# Patient Record
Sex: Female | Born: 1965
Health system: Southern US, Community
[De-identification: ages and names within clinical notes are randomized; demographics above are authoritative.]

## PROBLEM LIST (undated history)

## (undated) DIAGNOSIS — E669 Obesity, unspecified: Secondary | ICD-10-CM

## (undated) DIAGNOSIS — T7840XA Allergy, unspecified, initial encounter: Secondary | ICD-10-CM

## (undated) DIAGNOSIS — E049 Nontoxic goiter, unspecified: Secondary | ICD-10-CM

## (undated) DIAGNOSIS — K219 Gastro-esophageal reflux disease without esophagitis: Secondary | ICD-10-CM

## (undated) DIAGNOSIS — E063 Autoimmune thyroiditis: Secondary | ICD-10-CM

## (undated) DIAGNOSIS — D259 Leiomyoma of uterus, unspecified: Secondary | ICD-10-CM

## (undated) DIAGNOSIS — D649 Anemia, unspecified: Secondary | ICD-10-CM

## (undated) HISTORY — DX: Gastro-esophageal reflux disease without esophagitis: K21.9

## (undated) HISTORY — DX: Allergy, unspecified, initial encounter: T78.40XA

## (undated) HISTORY — DX: Anemia, unspecified: D64.9

## (undated) HISTORY — DX: Obesity, unspecified: E66.9

## (undated) HISTORY — DX: Nontoxic goiter, unspecified: E04.9

## (undated) HISTORY — DX: Autoimmune thyroiditis: E06.3

## (undated) HISTORY — PX: CHOLECYSTECTOMY: SHX55

---

## 1997-08-07 ENCOUNTER — Other Ambulatory Visit: Admission: RE | Admit: 1997-08-07 | Discharge: 1997-08-07 | Payer: Self-pay | Admitting: Family Medicine

## 1997-09-24 ENCOUNTER — Other Ambulatory Visit: Admission: RE | Admit: 1997-09-24 | Discharge: 1997-09-24 | Payer: Self-pay | Admitting: Family Medicine

## 1998-06-13 ENCOUNTER — Encounter: Admission: RE | Admit: 1998-06-13 | Discharge: 1998-06-13 | Payer: Self-pay | Admitting: Obstetrics

## 1998-07-09 ENCOUNTER — Emergency Department (HOSPITAL_COMMUNITY): Admission: EM | Admit: 1998-07-09 | Discharge: 1998-07-10 | Payer: Self-pay | Admitting: Emergency Medicine

## 1998-07-09 ENCOUNTER — Encounter: Payer: Self-pay | Admitting: Emergency Medicine

## 1998-11-10 ENCOUNTER — Emergency Department (HOSPITAL_COMMUNITY): Admission: EM | Admit: 1998-11-10 | Discharge: 1998-11-10 | Payer: Self-pay | Admitting: *Deleted

## 1999-09-11 ENCOUNTER — Encounter (INDEPENDENT_AMBULATORY_CARE_PROVIDER_SITE_OTHER): Payer: Self-pay | Admitting: Specialist

## 1999-09-11 ENCOUNTER — Observation Stay (HOSPITAL_COMMUNITY): Admission: RE | Admit: 1999-09-11 | Discharge: 1999-09-12 | Payer: Self-pay | Admitting: General Surgery

## 2000-03-22 ENCOUNTER — Other Ambulatory Visit: Admission: RE | Admit: 2000-03-22 | Discharge: 2000-03-22 | Payer: Self-pay | Admitting: Obstetrics and Gynecology

## 2000-03-22 ENCOUNTER — Encounter (INDEPENDENT_AMBULATORY_CARE_PROVIDER_SITE_OTHER): Payer: Self-pay

## 2000-03-31 ENCOUNTER — Encounter (INDEPENDENT_AMBULATORY_CARE_PROVIDER_SITE_OTHER): Payer: Self-pay

## 2000-03-31 ENCOUNTER — Other Ambulatory Visit: Admission: RE | Admit: 2000-03-31 | Discharge: 2000-03-31 | Payer: Self-pay | Admitting: Obstetrics and Gynecology

## 2001-04-04 ENCOUNTER — Encounter: Payer: Self-pay | Admitting: Emergency Medicine

## 2001-04-04 ENCOUNTER — Emergency Department (HOSPITAL_COMMUNITY): Admission: EM | Admit: 2001-04-04 | Discharge: 2001-04-04 | Payer: Self-pay | Admitting: Emergency Medicine

## 2004-03-26 ENCOUNTER — Other Ambulatory Visit: Admission: RE | Admit: 2004-03-26 | Discharge: 2004-03-26 | Payer: Self-pay | Admitting: Family Medicine

## 2005-04-22 ENCOUNTER — Other Ambulatory Visit: Admission: RE | Admit: 2005-04-22 | Discharge: 2005-04-22 | Payer: Self-pay | Admitting: Family Medicine

## 2006-04-15 ENCOUNTER — Ambulatory Visit: Payer: Self-pay | Admitting: Family Medicine

## 2006-04-21 ENCOUNTER — Ambulatory Visit: Payer: Self-pay | Admitting: Family Medicine

## 2006-04-30 ENCOUNTER — Emergency Department (HOSPITAL_COMMUNITY): Admission: EM | Admit: 2006-04-30 | Discharge: 2006-04-30 | Payer: Self-pay | Admitting: Family Medicine

## 2006-05-12 ENCOUNTER — Other Ambulatory Visit: Admission: RE | Admit: 2006-05-12 | Discharge: 2006-05-12 | Payer: Self-pay | Admitting: Family Medicine

## 2006-05-12 ENCOUNTER — Ambulatory Visit: Payer: Self-pay | Admitting: Family Medicine

## 2006-07-27 ENCOUNTER — Ambulatory Visit: Payer: Self-pay | Admitting: Family Medicine

## 2006-08-02 ENCOUNTER — Encounter: Admission: RE | Admit: 2006-08-02 | Discharge: 2006-08-02 | Payer: Self-pay | Admitting: Family Medicine

## 2006-08-12 ENCOUNTER — Encounter (INDEPENDENT_AMBULATORY_CARE_PROVIDER_SITE_OTHER): Payer: Self-pay | Admitting: Interventional Radiology

## 2006-08-12 ENCOUNTER — Encounter: Admission: RE | Admit: 2006-08-12 | Discharge: 2006-08-12 | Payer: Self-pay | Admitting: Family Medicine

## 2006-08-12 ENCOUNTER — Other Ambulatory Visit: Admission: RE | Admit: 2006-08-12 | Discharge: 2006-08-12 | Payer: Self-pay | Admitting: Interventional Radiology

## 2007-02-22 ENCOUNTER — Encounter: Admission: RE | Admit: 2007-02-22 | Discharge: 2007-02-22 | Payer: Self-pay | Admitting: Endocrinology

## 2007-03-30 ENCOUNTER — Emergency Department (HOSPITAL_COMMUNITY): Admission: EM | Admit: 2007-03-30 | Discharge: 2007-03-30 | Payer: Self-pay | Admitting: Emergency Medicine

## 2007-05-30 ENCOUNTER — Ambulatory Visit: Payer: Self-pay | Admitting: Family Medicine

## 2007-05-30 ENCOUNTER — Other Ambulatory Visit: Admission: RE | Admit: 2007-05-30 | Discharge: 2007-05-30 | Payer: Self-pay | Admitting: Family Medicine

## 2007-09-02 ENCOUNTER — Ambulatory Visit: Payer: Self-pay | Admitting: Family Medicine

## 2008-02-25 ENCOUNTER — Emergency Department (HOSPITAL_COMMUNITY): Admission: EM | Admit: 2008-02-25 | Discharge: 2008-02-25 | Payer: Self-pay | Admitting: Emergency Medicine

## 2008-06-04 ENCOUNTER — Ambulatory Visit: Payer: Self-pay | Admitting: Family Medicine

## 2008-06-04 ENCOUNTER — Other Ambulatory Visit: Admission: RE | Admit: 2008-06-04 | Discharge: 2008-06-04 | Payer: Self-pay | Admitting: Family Medicine

## 2008-06-04 LAB — HM PAP SMEAR: HM Pap smear: NEGATIVE

## 2009-02-11 ENCOUNTER — Ambulatory Visit: Payer: Self-pay | Admitting: Family Medicine

## 2009-06-05 ENCOUNTER — Ambulatory Visit: Payer: Self-pay | Admitting: Family Medicine

## 2009-11-14 LAB — HM MAMMOGRAPHY: HM Mammogram: ABNORMAL

## 2010-06-10 ENCOUNTER — Emergency Department (HOSPITAL_COMMUNITY): Payer: Self-pay

## 2010-06-10 ENCOUNTER — Emergency Department (HOSPITAL_COMMUNITY)
Admission: EM | Admit: 2010-06-10 | Discharge: 2010-06-10 | Disposition: A | Discharge status: Home / Self Care | Payer: Self-pay | Attending: Emergency Medicine | Admitting: Emergency Medicine

## 2010-06-10 DIAGNOSIS — R51 Headache: Secondary | ICD-10-CM | POA: Insufficient documentation

## 2010-06-10 DIAGNOSIS — W010XXA Fall on same level from slipping, tripping and stumbling without subsequent striking against object, initial encounter: Secondary | ICD-10-CM | POA: Insufficient documentation

## 2010-06-10 DIAGNOSIS — Y92009 Unspecified place in unspecified non-institutional (private) residence as the place of occurrence of the external cause: Secondary | ICD-10-CM | POA: Insufficient documentation

## 2010-06-10 DIAGNOSIS — S060X9A Concussion with loss of consciousness of unspecified duration, initial encounter: Secondary | ICD-10-CM | POA: Insufficient documentation

## 2010-06-10 DIAGNOSIS — E039 Hypothyroidism, unspecified: Secondary | ICD-10-CM | POA: Insufficient documentation

## 2010-06-18 ENCOUNTER — Emergency Department (HOSPITAL_COMMUNITY)
Admission: EM | Admit: 2010-06-18 | Discharge: 2010-06-18 | Disposition: A | Discharge status: Home / Self Care | Payer: Self-pay | Attending: Emergency Medicine | Admitting: Emergency Medicine

## 2010-06-18 DIAGNOSIS — H53149 Visual discomfort, unspecified: Secondary | ICD-10-CM | POA: Insufficient documentation

## 2010-06-18 DIAGNOSIS — F0781 Postconcussional syndrome: Secondary | ICD-10-CM | POA: Insufficient documentation

## 2010-06-18 DIAGNOSIS — R51 Headache: Secondary | ICD-10-CM | POA: Insufficient documentation

## 2010-06-18 DIAGNOSIS — E039 Hypothyroidism, unspecified: Secondary | ICD-10-CM | POA: Insufficient documentation

## 2010-07-21 ENCOUNTER — Encounter (INDEPENDENT_AMBULATORY_CARE_PROVIDER_SITE_OTHER): Payer: Self-pay | Admitting: Family Medicine

## 2010-07-21 DIAGNOSIS — E039 Hypothyroidism, unspecified: Secondary | ICD-10-CM

## 2010-07-21 DIAGNOSIS — D649 Anemia, unspecified: Secondary | ICD-10-CM

## 2010-07-22 ENCOUNTER — Telehealth: Payer: Self-pay | Admitting: Family Medicine

## 2010-07-22 MED ORDER — LEVOTHYROXINE SODIUM 100 MCG PO TABS: 100.0000 ug | ORAL_TABLET | Freq: Every day | ORAL | Status: DC

## 2010-07-22 NOTE — Telephone Encounter (Signed)
I called patient to advise her of her normal TSH.  She needs refills on her generic Synthroid.  Also advised her of her FS glu 82 and Hgb 11.3 indicating mild anemia.  She was advised to take her iron daily, and to try taking it along with Colace to help with the constipation.  If constipation still develops, to take iron daily during cycle, and qod rest of the month.  She just started her menstrual cycle today.  The medication prescribed by her GYN to be taken during her cycles is Lysteda.  Refilled med x 1 year

## 2010-08-01 NOTE — Op Note (Signed)
[facility]  Patient:    Denise Schroeder, Denise Schroeder                       MRN: [id number] Proc. Date: 09/11/99 Adm. Date:  [id number] Attending:  Henrene Dodge                           Operative Report  PREOPERATIVE DIAGNOSIS:  Chronic cholecystitis.  POSTOPERATIVE DIAGNOSIS:  Chronic cholecystitis.  OPERATION:  Laparoscopic cholecystectomy.  ANESTHESIA:  General.  SURGEON:  Anselm Pancoast. Zachery Dakins, M.D.  ASSISTANT:  Abigail Miyamoto, M.D.  INDICATIONS:  Ms. Courtnei Ruddell is a 45 year old female who was referred to me at the courtesy of Dr. ____ with symptomatic recurrent gallstones.  She has had several episodes of kind of a recurrence of pain more recently, being out of work for about 10 days, and I saw her in the office on Tuesday.  She was not acutely ill but was having enough episodes of pain that I recommended we proceed on with a laparoscopic cholecystectomy.  Her liver function tests were unremarkable.  DESCRIPTION OF PROCEDURE:  Preoperatively she was given 3 g of Unasyn and PAS stockings, and was taken back to the operative suite.  The induction of general  anesthesia, an oral tube into the stomach, after the endotracheal tube had been  placed.  The abdomen was prepped with Betadine and SurgiScrub solution, and draped in a sterile manner.  An incision at the umbilicus was made down through the fatty tissue.  The patient was fairly heavy and the fascia was identified and pickup p between two Kochers.  A small opening made.  The underlying peritoneum identified, and a Kelly carefully placed through the peritoneum.  A traction suture was placed and then the Hasson cannula introduced.  The gallbladder was fairly tense and quite large, but not acutely inflamed.  The upper 10 mm trocar was placed under direct vision after anesthetizing the fascia, and the two lateral 5 mm trocars were placed in the appropriate position by Dr.  Magnus Ivan.  The gallbladder was grasped and retracted upward and outward. There were adhesions around it.  These were kind f peeled down.  Good hemostasis.  The large stone impacted in the neck of the gallbladder was easily identified.  The gallbladder was retracted upward and then the more proximal portion retracted outward, so that we could free up the proximal portion of the gallbladder, identifying the cystic duct where it junctioned to he gallbladder, and also the cystic artery.  The cystic artery was doubly clamped proximally, singly, distally, and then divided.  Then the cystic duct was freed up circumferentially.  A clip would not go across it fairly easily, even though the cystic duct was a little large, and three clips were placed proximally, singly, and distally, and the cystic duct divided.  No x-ray obtained.  The posterior artery was also identified and this was doubly clipped proximally, singly, distally, and divided.  Then the gallbladder was freed from its bed using the electrocautery first with the hook, and then switched to the spatula.  The gallbladder had a lot of kind of chronic inflammation.  This has obviously been a problem off and on,  more than just a few weeks that the patient has been significantly symptomatic.  Good hemostasis was obtained, and I switched the cautery to normal to coagulate the most distal portion, as the most distal portion of the gallbladder  was nearly truly intrahepatic and took right much dissection to kind of free it from the gallbladder.  Good hemostasis was obtained.  The camera was then placed in the upper 10 mm trocar.  The gallbladder neck grasped and brought up through the fascia, and then kind of manipulated so that the stone could come up through the fascia which was left open and slightly larger.  Two figure-of-eight of #0 Vicryl were placed to close the fascia.  Reinspection of the area of the peritoneum omentum on  testing up to the fascia closure, and then this was anesthetized with Marcaine.  The remaining irrigating fluid was aspirated.  Good hemostasis.  The two lateral 5 mm trocars were removed under direct vision.  No evidence of any bleeding up in the gallbladder fossa.  The carbon dioxide was released.  The upper 10 mm  trocar was withdrawn carefully.  The patient tolerated the procedure nicely and the subcutaneous wounds were closed with #4-0 Vicryl, and benzoin and Steri-Strips on the skin.  The patient was sent to the recovery room in a stable postoperative condition, hoping to be ready for discharge in the a.m. DD:  09/11/99 TD:  09/11/99 Job: [id number]

## 2010-09-29 ENCOUNTER — Emergency Department (HOSPITAL_COMMUNITY): Payer: No Typology Code available for payment source

## 2010-09-29 ENCOUNTER — Emergency Department (HOSPITAL_COMMUNITY)
Admission: EM | Admit: 2010-09-29 | Discharge: 2010-09-29 | Disposition: A | Discharge status: Home / Self Care | Payer: No Typology Code available for payment source | Attending: Emergency Medicine | Admitting: Emergency Medicine

## 2010-09-29 DIAGNOSIS — M542 Cervicalgia: Secondary | ICD-10-CM | POA: Insufficient documentation

## 2010-09-29 DIAGNOSIS — M546 Pain in thoracic spine: Secondary | ICD-10-CM | POA: Insufficient documentation

## 2010-09-29 DIAGNOSIS — E039 Hypothyroidism, unspecified: Secondary | ICD-10-CM | POA: Insufficient documentation

## 2010-10-01 ENCOUNTER — Ambulatory Visit (INDEPENDENT_AMBULATORY_CARE_PROVIDER_SITE_OTHER): Payer: Self-pay | Admitting: Medical

## 2010-10-01 ENCOUNTER — Encounter: Payer: Self-pay | Admitting: Medical

## 2010-10-01 DIAGNOSIS — S239XXA Sprain of unspecified parts of thorax, initial encounter: Secondary | ICD-10-CM

## 2010-10-01 DIAGNOSIS — S139XXA Sprain of joints and ligaments of unspecified parts of neck, initial encounter: Secondary | ICD-10-CM

## 2010-10-01 DIAGNOSIS — M791 Myalgia, unspecified site: Secondary | ICD-10-CM

## 2010-10-01 DIAGNOSIS — IMO0002 Reserved for concepts with insufficient information to code with codable children: Secondary | ICD-10-CM

## 2010-10-01 DIAGNOSIS — S161XXA Strain of muscle, fascia and tendon at neck level, initial encounter: Secondary | ICD-10-CM

## 2010-10-01 DIAGNOSIS — IMO0001 Reserved for inherently not codable concepts without codable children: Secondary | ICD-10-CM

## 2010-10-01 NOTE — Patient Instructions (Signed)
Rest, use gentle stretching and range of motion activity for neck, shoulders, arms, and back.  Heat pad as desired.  You can use Flexeril 2-3 times daily for spasm.  OTC Ibuprofen 200 mg, take 3-4 tablets up to 3 times daily.  You will be sore for at least a week.    If not improving by the end of the week or early next week, call and we can refer to physical therapy.
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
## 2010-10-06 ENCOUNTER — Encounter: Payer: Self-pay | Admitting: Family Medicine

## 2010-10-07 ENCOUNTER — Encounter: Payer: Self-pay | Admitting: Medical

## 2010-10-07 ENCOUNTER — Ambulatory Visit (INDEPENDENT_AMBULATORY_CARE_PROVIDER_SITE_OTHER): Payer: Self-pay | Admitting: Medical

## 2010-10-07 VITALS — BP 110/80 | HR 64 | Temp 98.4°F | Ht 68.0 in | Wt 268.0 lb

## 2010-10-07 DIAGNOSIS — S139XXA Sprain of joints and ligaments of unspecified parts of neck, initial encounter: Secondary | ICD-10-CM

## 2010-10-07 DIAGNOSIS — S161XXA Strain of muscle, fascia and tendon at neck level, initial encounter: Secondary | ICD-10-CM

## 2010-10-07 DIAGNOSIS — M542 Cervicalgia: Secondary | ICD-10-CM

## 2010-10-07 DIAGNOSIS — R51 Headache: Secondary | ICD-10-CM
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
## 2010-10-10 ENCOUNTER — Telehealth: Payer: Self-pay | Admitting: *Deleted

## 2010-10-10 ENCOUNTER — Other Ambulatory Visit: Payer: Self-pay | Admitting: *Deleted

## 2010-10-10 MED ORDER — TIZANIDINE HCL 4 MG PO TABS: 4.0000 mg | ORAL_TABLET | Freq: Every day | ORAL | Status: AC

## 2010-10-10 NOTE — Telephone Encounter (Addendum)
Message copied by Dorthula Perfect on Fri Oct 10, 2010  9:54 AM ------      Message from: Aleen Campi, DAVID S      Created: Thu Oct 09, 2010  4:30 PM       Pt called and said muscle relaxer is too strong.   She can just use antiinflammatories such as Ibuprofen if needed.  Regarding muscle relaxer, she can either try 1/2 tablet QHS, or we can send Tizanidine and see if this is less sedating.              Has she begun PT?   Pt was called and will try 1/2 tablet of Flexeril QHS tonight.  If still complaining of sleepiness then pt will call back Friday 10-10-10 to let nurse know.  Nurse will then call in Tizanidine 4 mg 1 QHS #20 with no refill to pharmacy.  Pt will start PT on 10-13-10 at 9:15 am at [facility].  CM LPN

## 2010-10-13 ENCOUNTER — Ambulatory Visit
Payer: No Typology Code available for payment source | Attending: Family Medicine | Admitting: Rehabilitative and Restorative Service Providers"

## 2010-10-13 DIAGNOSIS — IMO0001 Reserved for inherently not codable concepts without codable children: Secondary | ICD-10-CM | POA: Insufficient documentation

## 2010-10-13 DIAGNOSIS — M2569 Stiffness of other specified joint, not elsewhere classified: Secondary | ICD-10-CM | POA: Insufficient documentation

## 2010-10-13 DIAGNOSIS — M542 Cervicalgia: Secondary | ICD-10-CM | POA: Insufficient documentation

## 2010-10-14 ENCOUNTER — Encounter: Payer: Self-pay | Admitting: Rehabilitative and Restorative Service Providers"

## 2010-10-16 ENCOUNTER — Ambulatory Visit
Payer: No Typology Code available for payment source | Attending: Family Medicine | Admitting: Rehabilitative and Restorative Service Providers"

## 2010-10-16 DIAGNOSIS — M542 Cervicalgia: Secondary | ICD-10-CM | POA: Insufficient documentation

## 2010-10-16 DIAGNOSIS — IMO0001 Reserved for inherently not codable concepts without codable children: Secondary | ICD-10-CM | POA: Insufficient documentation

## 2010-10-16 DIAGNOSIS — M2569 Stiffness of other specified joint, not elsewhere classified: Secondary | ICD-10-CM | POA: Insufficient documentation

## 2010-10-20 ENCOUNTER — Ambulatory Visit: Payer: No Typology Code available for payment source | Admitting: Rehabilitative and Restorative Service Providers"

## 2010-10-23 ENCOUNTER — Ambulatory Visit: Payer: No Typology Code available for payment source | Admitting: Rehabilitative and Restorative Service Providers"

## 2010-10-27 ENCOUNTER — Ambulatory Visit: Payer: Self-pay | Admitting: Medical

## 2010-10-27 ENCOUNTER — Encounter: Payer: Self-pay | Admitting: Rehabilitative and Restorative Service Providers"

## 2010-10-30 ENCOUNTER — Ambulatory Visit: Payer: No Typology Code available for payment source | Admitting: Rehabilitative and Restorative Service Providers"

## 2010-11-03 ENCOUNTER — Ambulatory Visit: Payer: No Typology Code available for payment source | Admitting: Rehabilitative and Restorative Service Providers"

## 2010-11-05 ENCOUNTER — Ambulatory Visit: Payer: No Typology Code available for payment source | Admitting: Rehabilitative and Restorative Service Providers"

## 2010-11-10 ENCOUNTER — Ambulatory Visit: Payer: No Typology Code available for payment source | Admitting: Rehabilitative and Restorative Service Providers"

## 2010-11-12 ENCOUNTER — Ambulatory Visit: Payer: No Typology Code available for payment source | Admitting: Rehabilitative and Restorative Service Providers"

## 2010-11-14 ENCOUNTER — Other Ambulatory Visit: Payer: Self-pay | Admitting: Obstetrics and Gynecology

## 2010-11-24 ENCOUNTER — Ambulatory Visit
Payer: No Typology Code available for payment source | Attending: Family Medicine | Admitting: Rehabilitative and Restorative Service Providers"

## 2010-11-24 DIAGNOSIS — IMO0001 Reserved for inherently not codable concepts without codable children: Secondary | ICD-10-CM | POA: Insufficient documentation

## 2010-11-24 DIAGNOSIS — M2569 Stiffness of other specified joint, not elsewhere classified: Secondary | ICD-10-CM | POA: Insufficient documentation

## 2010-11-24 DIAGNOSIS — M542 Cervicalgia: Secondary | ICD-10-CM | POA: Insufficient documentation

## 2010-12-02 ENCOUNTER — Ambulatory Visit (INDEPENDENT_AMBULATORY_CARE_PROVIDER_SITE_OTHER): Payer: Self-pay | Admitting: Medical

## 2010-12-02 ENCOUNTER — Encounter: Payer: Self-pay | Admitting: Medical

## 2010-12-02 VITALS — BP 128/88 | HR 60 | Temp 98.0°F | Resp 16 | Ht 69.0 in | Wt 273.0 lb

## 2010-12-02 DIAGNOSIS — M25519 Pain in unspecified shoulder: Secondary | ICD-10-CM

## 2010-12-02 DIAGNOSIS — M542 Cervicalgia: Secondary | ICD-10-CM

## 2010-12-02 DIAGNOSIS — M25512 Pain in left shoulder: Secondary | ICD-10-CM

## 2010-12-02 MED ORDER — CYCLOBENZAPRINE HCL 10 MG PO TABS: ORAL_TABLET | ORAL | Status: DC
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
## 2010-12-03 LAB — URINALYSIS, ROUTINE W REFLEX MICROSCOPIC
Nitrite: NEGATIVE
Specific Gravity, Urine: 1.023
Urobilinogen, UA: 1
pH: 6

## 2010-12-19 ENCOUNTER — Encounter (HOSPITAL_BASED_OUTPATIENT_CLINIC_OR_DEPARTMENT_OTHER): Payer: Self-pay

## 2010-12-19 ENCOUNTER — Emergency Department (HOSPITAL_BASED_OUTPATIENT_CLINIC_OR_DEPARTMENT_OTHER)
Admission: EM | Admit: 2010-12-19 | Discharge: 2010-12-19 | Disposition: A | Discharge status: Home / Self Care | Payer: Self-pay | Attending: Emergency Medicine | Admitting: Emergency Medicine

## 2010-12-19 DIAGNOSIS — E669 Obesity, unspecified: Secondary | ICD-10-CM | POA: Insufficient documentation

## 2010-12-19 DIAGNOSIS — K219 Gastro-esophageal reflux disease without esophagitis: Secondary | ICD-10-CM | POA: Insufficient documentation

## 2010-12-19 DIAGNOSIS — M549 Dorsalgia, unspecified: Secondary | ICD-10-CM | POA: Insufficient documentation

## 2010-12-19 LAB — URINALYSIS, ROUTINE W REFLEX MICROSCOPIC
Bilirubin Urine: NEGATIVE
Bilirubin Urine: NEGATIVE
Glucose, UA: NEGATIVE mg/dL
Hgb urine dipstick: NEGATIVE
Ketones, ur: NEGATIVE mg/dL
Leukocytes, UA: NEGATIVE
Nitrite: NEGATIVE
Nitrite: NEGATIVE
Protein, ur: NEGATIVE mg/dL
Protein, ur: NEGATIVE mg/dL
Specific Gravity, Urine: 1.016 (ref 1.005–1.030)
Specific Gravity, Urine: 1.028 (ref 1.005–1.030)
Urobilinogen, UA: 0.2 mg/dL (ref 0.0–1.0)
Urobilinogen, UA: 0.2 mg/dL (ref 0.0–1.0)
pH: 6 (ref 5.0–8.0)

## 2010-12-19 LAB — DIFFERENTIAL
Basophils Absolute: 0 10*3/uL (ref 0.0–0.1)
Basophils Relative: 0 % (ref 0–1)
Eosinophils Absolute: 0.3 10*3/uL (ref 0.0–0.7)
Lymphocytes Relative: 37 % (ref 12–46)
Monocytes Absolute: 0.6 10*3/uL (ref 0.1–1.0)
Neutro Abs: 3.3 10*3/uL (ref 1.7–7.7)

## 2010-12-19 LAB — CBC
Hemoglobin: 11.6 g/dL — ABNORMAL LOW (ref 12.0–15.0)
MCHC: 32.7 g/dL (ref 30.0–36.0)
MCV: 88.5 fL (ref 78.0–100.0)
RBC: 4 MIL/uL (ref 3.87–5.11)
RDW: 14.1 % (ref 11.5–15.5)

## 2010-12-19 LAB — POCT I-STAT, CHEM 8
Calcium, Ion: 1.2 mmol/L (ref 1.12–1.32)
Glucose, Bld: 84 mg/dL (ref 70–99)
HCT: 35 % — ABNORMAL LOW (ref 36.0–46.0)
Hemoglobin: 11.9 g/dL — ABNORMAL LOW (ref 12.0–15.0)
TCO2: 26 mmol/L (ref 0–100)

## 2010-12-19 LAB — URINE MICROSCOPIC-ADD ON

## 2010-12-19 MED ORDER — HYDROCODONE-ACETAMINOPHEN 5-325 MG PO TABS: 2.0000 | ORAL_TABLET | ORAL | Status: DC | PRN

## 2010-12-19 MED ORDER — PREDNISONE 10 MG PO TABS: ORAL_TABLET | ORAL | Status: DC

## 2010-12-19 MED ORDER — KETOROLAC TROMETHAMINE 60 MG/2ML IM SOLN
60.0000 mg | Freq: Once | INTRAMUSCULAR | Status: AC
  Administered 2010-12-19: 60 mg via INTRAMUSCULAR
  Filled 2010-12-19: qty 2

## 2010-12-19 MED ORDER — ONDANSETRON HCL 4 MG/2ML IJ SOLN
4.0000 mg | Freq: Once | INTRAMUSCULAR | Status: AC
  Administered 2010-12-19: 4 mg via INTRAMUSCULAR
  Filled 2010-12-19: qty 2

## 2010-12-19 MED ORDER — HYDROMORPHONE HCL 1 MG/ML IJ SOLN
2.0000 mg | Freq: Once | INTRAMUSCULAR | Status: AC
  Administered 2010-12-19: 2 mg via INTRAMUSCULAR
  Filled 2010-12-19 (×2): qty 1

## 2010-12-19 NOTE — ED Notes (Signed)
Lower back pain x 2 weeks-denies injury

## 2010-12-19 NOTE — ED Provider Notes (Signed)
History     CSN: [id number] Arrival date & time: 12/19/2010  9:08 PM  Chief Complaint  Patient presents with  . Back Pain    (Consider location/radiation/quality/duration/timing/severity/associated sxs/prior treatment) Patient is a 45 y.o. female presenting with back pain.  Back Pain  This is a new problem. The current episode started more than 1 week ago. The problem occurs constantly. The problem has been gradually worsening. The pain is associated with no known injury. The pain is present in the lumbar spine. The quality of the pain is described as stabbing and shooting. The pain is at a severity of 8/10. The pain is severe. The symptoms are aggravated by bending, twisting and certain positions. The pain is worse during the night. Stiffness is present all day. Pertinent negatives include no chest pain. She has tried nothing for the symptoms. The treatment provided no relief.    Past Medical History  Diagnosis Date  . GERD (gastroesophageal reflux disease)   . Anemia   . Allergy   . Hashimoto thyroiditis   . Goiter   . Obesity     Past Surgical History  Procedure Date  . Cholecystectomy     No family history on file.  History  Substance Use Topics  . Smoking status: Never Smoker   . Smokeless tobacco: Not on file  . Alcohol Use: No    OB History    Grav Para Term Preterm Abortions TAB SAB Ect Mult Living                  Review of Systems  Cardiovascular: Negative for chest pain.  Musculoskeletal: Positive for back pain.  All other systems reviewed and are negative.    Allergies  Citrus and Latex  Home Medications   Current Outpatient Rx  Name Route Sig Dispense Refill  . CYCLOBENZAPRINE HCL 10 MG PO TABS  1/2-1 tablet prn QHS or up to BID prn 30 tablet 0  . IBUPROFEN 800 MG PO TABS Oral Take 800 mg by mouth every 8 (eight) hours as needed. For pain     . LEVOTHYROXINE SODIUM 100 MCG PO TABS Oral Take 100 mcg by mouth daily.      Marland Kitchen ONE-DAILY MULTI  VITAMINS PO TABS Oral Take 1 tablet by mouth daily.      Marland Kitchen LEVOTHYROXINE SODIUM 100 MCG PO TABS Oral Take 1 tablet (100 mcg total) by mouth daily. 30 tablet 11    BP 109/73  Pulse 66  Temp(Src) 98.2 F (36.8 C) (Oral)  Resp 16  Ht 5\' 8"  (1.727 m)  Wt 272 lb (123.378 kg)  BMI 41.36 kg/m2  SpO2 97%  LMP 11/15/2010  Physical Exam  Nursing note and vitals reviewed. Constitutional: She is oriented to person, place, and time. She appears well-developed and well-nourished.  HENT:  Head: Normocephalic and atraumatic.  Eyes: Conjunctivae and EOM are normal. Pupils are equal, round, and reactive to light.  Neck: Normal range of motion. Neck supple.  Cardiovascular: Normal rate and regular rhythm.   Pulmonary/Chest: Effort normal and breath sounds normal.  Abdominal: Soft. Bowel sounds are normal.  Musculoskeletal: She exhibits tenderness.       Tender ls spine ,  Decreased range of motion,  Pain with moving  Neurological: She is alert and oriented to person, place, and time.  Skin: Skin is warm.  Psychiatric: She has a normal mood and affect.    ED Course  Procedures (including critical care time)   Labs Reviewed  URINALYSIS,  ROUTINE W REFLEX MICROSCOPIC   No results found.   No diagnosis found.    MDM  Urine negative,  Pt advised to follow up with Dr. Susann Givens next week.        Langston Masker, [place] 12/19/10 2207

## 2010-12-22 ENCOUNTER — Ambulatory Visit (INDEPENDENT_AMBULATORY_CARE_PROVIDER_SITE_OTHER): Payer: Self-pay | Admitting: Medical

## 2010-12-22 ENCOUNTER — Telehealth: Payer: Self-pay | Admitting: Medical

## 2010-12-22 ENCOUNTER — Encounter: Payer: Self-pay | Admitting: Medical

## 2010-12-22 VITALS — BP 120/82 | HR 60 | Temp 98.5°F | Ht 70.0 in | Wt 273.0 lb

## 2010-12-22 DIAGNOSIS — M549 Dorsalgia, unspecified: Secondary | ICD-10-CM | POA: Insufficient documentation

## 2010-12-22 DIAGNOSIS — M545 Low back pain, unspecified: Secondary | ICD-10-CM

## 2010-12-22 DIAGNOSIS — R10819 Abdominal tenderness, unspecified site: Secondary | ICD-10-CM

## 2010-12-22 DIAGNOSIS — N23 Unspecified renal colic: Secondary | ICD-10-CM

## 2010-12-22 LAB — POCT URINALYSIS DIPSTICK
Ketones, UA: NEGATIVE
Leukocytes, UA: NEGATIVE
Protein, UA: NEGATIVE
pH, UA: 5

## 2010-12-22 NOTE — Patient Instructions (Signed)
Hydrate well with water, use your pain medication that was prescribed by the Emergency Dept.  Urinate through the hat in the event you see stone material.  If you pain worsens, or if new symptoms occur, then call or return.  Otherwise, call back Wednesday to give me an update on your symptoms.
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
## 2010-12-22 NOTE — Telephone Encounter (Signed)
As we discussed, at this point I want her to use a course of the pain medication (Hydrocodone) given by the ED, rest, no heavy lifting, and strain urine to look for stones.  I want her to call back Wednesday or Thursday to let me know if pain is improving?  If worsening, we can consider CT of abdomen or other imaging pending pain level.  I advised against imaging at the moment.

## 2010-12-22 NOTE — Telephone Encounter (Signed)
Please call patient, she is trying to find least expensive facility for MRI, needs to know what you are going to order

## 2010-12-23 ENCOUNTER — Ambulatory Visit (INDEPENDENT_AMBULATORY_CARE_PROVIDER_SITE_OTHER): Payer: Self-pay | Admitting: Medical

## 2010-12-23 ENCOUNTER — Encounter: Payer: Self-pay | Admitting: Medical

## 2010-12-23 ENCOUNTER — Ambulatory Visit: Admission: RE | Admit: 2010-12-23 | Payer: Self-pay | Source: Ambulatory Visit

## 2010-12-23 ENCOUNTER — Other Ambulatory Visit: Payer: Self-pay | Admitting: Family Medicine

## 2010-12-23 ENCOUNTER — Telehealth: Payer: Self-pay | Admitting: Family Medicine

## 2010-12-23 VITALS — BP 110/80 | HR 68 | Temp 98.5°F | Resp 18

## 2010-12-23 DIAGNOSIS — R109 Unspecified abdominal pain: Secondary | ICD-10-CM

## 2010-12-23 DIAGNOSIS — M549 Dorsalgia, unspecified: Secondary | ICD-10-CM

## 2010-12-23 DIAGNOSIS — R3129 Other microscopic hematuria: Secondary | ICD-10-CM

## 2010-12-23 LAB — POCT URINALYSIS DIPSTICK
Bilirubin, UA: NEGATIVE
Glucose, UA: NEGATIVE
Leukocytes, UA: NEGATIVE
Nitrite, UA: NEGATIVE

## 2010-12-23 MED ORDER — OXYCODONE-ACETAMINOPHEN 7.5-325 MG PO TABS: 1.0000 | ORAL_TABLET | ORAL | Status: DC | PRN

## 2010-12-23 MED ORDER — CIPROFLOXACIN HCL 500 MG PO TABS: 500.0000 mg | ORAL_TABLET | Freq: Two times a day (BID) | ORAL | Status: AC

## 2010-12-23 NOTE — Telephone Encounter (Signed)
Patient is coming in at 230pm for an appointment. CLS

## 2010-12-23 NOTE — Patient Instructions (Signed)
Begin Cipro 1 tablet twice daily for urinary infection.  Begin Percocet every 6 hours as needed for pain.  As pain subsides, then just use Vicodin or OTC Aleve for pain.    Drink lots of water to flush the kidneys.  We will call you with culture results. Marland Kitchen

## 2010-12-23 NOTE — Telephone Encounter (Signed)
I called and discussed concerns with her.  She will return today for recheck
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
## 2010-12-24 LAB — URINE CULTURE
Colony Count: NO GROWTH
Organism ID, Bacteria: NO GROWTH

## 2010-12-25 LAB — URINE CULTURE

## 2010-12-26 NOTE — ED Provider Notes (Signed)
Medical screening examination/treatment/procedure(s) were performed by non-physician practitioner and as supervising physician I was immediately available for consultation/collaboration.   Raeford Razor, MD 12/26/10 1116

## 2010-12-29 ENCOUNTER — Telehealth: Payer: Self-pay | Admitting: Medical

## 2010-12-29 ENCOUNTER — Telehealth: Payer: Self-pay | Admitting: Family Medicine

## 2010-12-29 NOTE — Telephone Encounter (Signed)
I called and spoke to pt.  She finished Cipro which seemed to help some.  She still had a lot of back pain over the weekend, worsened some today, had to come home from work.  She took Percocet but this sedates her.  She has not taken the prednisone given by the ED.  I advised she try cutting Vicodin or Percocet in half and use this prn for pain.  Can also use OTC ibuprofen.  She will also begin the short course of Prednisone.  If not improving in 2-3 days, will call back.

## 2010-12-31 NOTE — Telephone Encounter (Signed)
shane handled

## 2011-01-06 ENCOUNTER — Telehealth: Payer: Self-pay | Admitting: Family Medicine

## 2011-01-06 NOTE — Telephone Encounter (Signed)
If overall doing better/much improved, then I would just use some OTC Aleve once to twice daily prn, try and start doing some general slow back stretches daily, and walk some for exercise until completely back to normal.  If she worsens again, or if things are not improving, a referral back to PT for back pain would be a good option.

## 2011-01-06 NOTE — Telephone Encounter (Signed)
PATIENT CALLED TO LET YOU KNOW THAT SHE FINISH HER LAST PREDISONE AND SHE SAID THAT SHE FELLS BETTER. SHE SAID THAT SHE STILL FEELS A LITTLE MUSCLE SORENESS. HER QUESTION IS DOES SHE NEED ANOTHER RX OR WHAT DOES SHE NEED TO DO NEXT. CLS

## 2011-01-06 NOTE — Telephone Encounter (Signed)
Patient was notified of Shane's advise. CLS

## 2011-01-14 ENCOUNTER — Telehealth: Payer: Self-pay | Admitting: Family Medicine

## 2011-01-14 NOTE — Telephone Encounter (Signed)
At this point, her evaluation to date is more suggestive of a musculoskeletal issue.  I know she just completed therapy for other issues, but since she is no better, I would recommend either referral back to physical therapy or consider either starting with back xray or MRI of the lumbar spine.  Verify she has not had any recent low back xray, CT or MRI.  (there is some note in chart about CT L spine, but I don't see any result)  Where is her current pain?  Does it radiate to either leg? Either numbness or tingling?

## 2011-01-14 NOTE — Telephone Encounter (Signed)
LM for pt. Top callback. CLS

## 2011-01-15 ENCOUNTER — Other Ambulatory Visit: Payer: Self-pay | Admitting: Medical

## 2011-01-15 DIAGNOSIS — M549 Dorsalgia, unspecified: Secondary | ICD-10-CM

## 2011-01-15 MED ORDER — TRAMADOL HCL 50 MG PO TABS: ORAL_TABLET | ORAL | Status: DC

## 2011-01-15 NOTE — Telephone Encounter (Signed)
Patient states that she thinks that she should have a MRI of her back verus the PT. She said that she is going to have to spend money so she would rather spend it on xrays of her back. She said that you could also send in her the RX for Ultram to the pharmacy. CLS

## 2011-01-15 NOTE — Telephone Encounter (Signed)
Would she be willing to go to at least a few sessions of PT to help the back pains?  If so refer and advise that she is self pay.  Start with the therapy office she went to recently.   Otherwise she can use muscle relaxer periodically.  Other ideas that would be low cost would be to look into water aerobics at the [facility] or Yoga classes to help with stretching.    I can write for Ultram pain medication to be used as needed basis for breakthrough pain.

## 2011-01-15 NOTE — Telephone Encounter (Signed)
i sent script for ultram for pain.  To keep costs low, lets start with xray of back.  I put order in system. Pls write order to [facility] for complete lumbar spine xray and have her go for xrays of back.  Thanks.

## 2011-01-15 NOTE — Telephone Encounter (Signed)
Patient called back and said that her pain is coming and going. She went out of town and she had her percet and the ibuprofen to help her get through the pain because there was a lot of walking. She said when she left work yesterday she said her pain level was at a 9. She said the pain is not going into her legs and she is not having any tingling or numbness. She said the pain is still in that one area and it feels like a burning feeling. She also said that she has not had any diagnostic xrays of her back only neck and shoulder. She said that she is using the aleve and doing the excersies you suggested that she do. CLS

## 2011-01-16 ENCOUNTER — Ambulatory Visit
Admission: RE | Admit: 2011-01-16 | Discharge: 2011-01-16 | Disposition: A | Discharge status: Home / Self Care | Payer: No Typology Code available for payment source | Source: Ambulatory Visit | Attending: Medical | Admitting: Medical

## 2011-01-16 ENCOUNTER — Telehealth: Payer: Self-pay | Admitting: *Deleted

## 2011-01-16 DIAGNOSIS — M549 Dorsalgia, unspecified: Secondary | ICD-10-CM

## 2011-01-16 NOTE — Telephone Encounter (Signed)
Called patient, she was already aware of the rx being called in and had picked up last evening. She will go to [facility] today for complete spine series.

## 2011-01-20 NOTE — Telephone Encounter (Signed)
Patient went to [facility] on Friday and had her back xrays done. CLS

## 2011-03-13 ENCOUNTER — Other Ambulatory Visit (INDEPENDENT_AMBULATORY_CARE_PROVIDER_SITE_OTHER): Payer: Self-pay

## 2011-03-13 DIAGNOSIS — Z23 Encounter for immunization: Secondary | ICD-10-CM

## 2011-03-13 DIAGNOSIS — Z111 Encounter for screening for respiratory tuberculosis: Secondary | ICD-10-CM

## 2011-03-15 ENCOUNTER — Emergency Department (HOSPITAL_COMMUNITY): Payer: Self-pay

## 2011-03-15 ENCOUNTER — Encounter (HOSPITAL_COMMUNITY): Payer: Self-pay | Admitting: *Deleted

## 2011-03-15 ENCOUNTER — Emergency Department (HOSPITAL_COMMUNITY)
Admission: EM | Admit: 2011-03-15 | Discharge: 2011-03-15 | Disposition: A | Discharge status: Home / Self Care | Payer: Self-pay | Attending: Emergency Medicine | Admitting: Emergency Medicine

## 2011-03-15 DIAGNOSIS — K219 Gastro-esophageal reflux disease without esophagitis: Secondary | ICD-10-CM | POA: Insufficient documentation

## 2011-03-15 DIAGNOSIS — R1310 Dysphagia, unspecified: Secondary | ICD-10-CM | POA: Insufficient documentation

## 2011-03-15 DIAGNOSIS — E041 Nontoxic single thyroid nodule: Secondary | ICD-10-CM | POA: Insufficient documentation

## 2011-03-15 DIAGNOSIS — E063 Autoimmune thyroiditis: Secondary | ICD-10-CM | POA: Insufficient documentation

## 2011-03-15 DIAGNOSIS — E669 Obesity, unspecified: Secondary | ICD-10-CM | POA: Insufficient documentation

## 2011-03-15 DIAGNOSIS — R0602 Shortness of breath: Secondary | ICD-10-CM | POA: Insufficient documentation

## 2011-03-15 DIAGNOSIS — R490 Dysphonia: Secondary | ICD-10-CM | POA: Insufficient documentation

## 2011-03-15 MED ORDER — ESOMEPRAZOLE MAGNESIUM 40 MG PO CPDR: 40.0000 mg | DELAYED_RELEASE_CAPSULE | Freq: Every day | ORAL | Status: DC

## 2011-03-15 MED ORDER — GI COCKTAIL ~~LOC~~
30.0000 mL | Freq: Once | ORAL | Status: AC
  Administered 2011-03-15: 30 mL via ORAL
  Filled 2011-03-15: qty 30

## 2011-03-15 NOTE — ED Notes (Signed)
Pt in from home c/o SOB onset from sleep at 06:30, pt states, "It feels like something is choking me or there is a knot in the R side of my throat.

## 2011-03-15 NOTE — ED Provider Notes (Signed)
History     CSN: [id number]  Arrival date & time 03/15/11  0711   First MD Initiated Contact with Patient 03/15/11 [phone]      Chief Complaint  Patient presents with  . Shortness of Breath    (Consider location/radiation/quality/duration/timing/severity/associated sxs/prior treatment) HPI Patient is a 45 year old female who presents today for evaluation of shortness of breath. Patient complains of throat discomfort that began yesterday. She got this initially was reflux. She became concerned when she had difficulty breathing while laying down during the night. This is improved since arrival. Patient describes difficulty with swallowing. She's not tried eat or drink anything this morning. She also notes she has history of a thyroid nodule and is on and throat for this. She sees her doctor annually to get this checked. She did not note any swelling there is different than usual in her neck externally. She does have a palpable lump on the right lower portion of her neck just proximal to the thoracic inlet. Patient has some mild hoarseness but is able to speak easily in full sentences. She has no history of cough with this and no known sick contacts over the past 2 weeks. She denies any myalgias or fevers. There are no other associated or modifying factors. Past Medical History  Diagnosis Date  . GERD (gastroesophageal reflux disease)   . Anemia   . Allergy   . Hashimoto thyroiditis   . Goiter   . Obesity     Past Surgical History  Procedure Date  . Cholecystectomy     No family history on file.  History  Substance Use Topics  . Smoking status: Never Smoker   . Smokeless tobacco: Never Used  . Alcohol Use: No    OB History    Grav Para Term Preterm Abortions TAB SAB Ect Mult Living                  Review of Systems  Constitutional: Negative.   HENT: Positive for sore throat, trouble swallowing and voice change.   Eyes: Negative.   Respiratory: Positive for shortness of  breath. Negative for cough.   Cardiovascular: Negative.   Gastrointestinal: Negative.   Genitourinary: Negative.   Musculoskeletal: Negative.   Skin: Negative.   Neurological: Negative.   Hematological: Negative.   Psychiatric/Behavioral: Negative.   All other systems reviewed and are negative.    Allergies  Citrus and Latex  Home Medications   Current Outpatient Rx  Name Route Sig Dispense Refill  . CYCLOBENZAPRINE HCL 10 MG PO TABS Oral Take 5-10 mg by mouth 3 (three) times daily as needed. For muscle spasms     . IBUPROFEN 800 MG PO TABS Oral Take 800 mg by mouth every 8 (eight) hours as needed. For pain     . LEVOTHYROXINE SODIUM 100 MCG PO TABS Oral Take 100 mcg by mouth daily.      . TRAMADOL HCL 50 MG PO TABS Oral Take 50 mg by mouth every 6 (six) hours as needed. 2 to 3 times daily For pain     . LEVOTHYROXINE SODIUM 100 MCG PO TABS Oral Take 1 tablet (100 mcg total) by mouth daily. 30 tablet 11    BP 96/73  Pulse 70  Temp(Src) 98.1 F (36.7 C) (Oral)  SpO2 99%  LMP 03/04/2011  Physical Exam  Nursing note and vitals reviewed. Constitutional: She is oriented to person, place, and time. She appears well-developed and well-nourished. No distress.  HENT:  Head: Normocephalic and  atraumatic. No trismus in the jaw.  Nose: Nose normal.  Mouth/Throat: Uvula is midline and mucous membranes are normal. No uvula swelling. Posterior oropharyngeal erythema present. No oropharyngeal exudate, posterior oropharyngeal edema or tonsillar abscesses.  Eyes: Conjunctivae and EOM are normal. Pupils are equal, round, and reactive to light.  Neck: Normal range of motion. No tracheal deviation present.       Patient does have palpable lump contiguous with the thyroid on the right. Patient says this is unchanged from baseline. She has been followed by her primary care physician to  Cardiovascular: Normal rate, regular rhythm, normal heart sounds and intact distal pulses.  Exam reveals no  gallop and no friction rub.   No murmur heard. Pulmonary/Chest: Effort normal and breath sounds normal. No stridor. No respiratory distress. She has no wheezes. She has no rales.  Abdominal: Soft. Bowel sounds are normal. She exhibits no distension. There is no tenderness. There is no rebound and no guarding.  Musculoskeletal: Normal range of motion. She exhibits no edema and no tenderness.  Neurological: She is alert and oriented to person, place, and time. No cranial nerve deficit. She exhibits normal muscle tone. Coordination normal.  Skin: Skin is warm and dry. No rash noted.  Psychiatric: She has a normal mood and affect.    ED Course  Procedures (including critical care time)  Labs Reviewed - No data to display Dg Neck Soft Tissue  03/15/2011  *RADIOLOGY REPORT*  Clinical Data: Evaluate airway.  Throat pain.  NECK SOFT TISSUES - 1+ VIEW  Comparison: CT 09/29/2010  Findings: Single lateral view of the neck was obtained.  Normal appearance of the prevertebral soft tissues.  Normal appearance of the epiglottis and aryepiglottic folds.  Normal alignment of the cervical spine.  IMPRESSION: Negative soft tissue neck exam.  Original Report Authenticated By: Richarda Overlie, M.D.   Dg Chest 2 View  03/15/2011  *RADIOLOGY REPORT*  Clinical Data: Shortness of breath.  CHEST - 2 VIEW  Comparison: 09/29/2010  Findings: Two views of the chest were obtained.  Lungs are clear without focal airspace disease or edema. Heart and mediastinum are within normal limits.  Evidence for cholecystectomy clips in the upper abdomen.  No evidence for pleural effusions.  IMPRESSION: No acute chest findings.  Original Report Authenticated By: Richarda Overlie, M.D.     1. GERD (gastroesophageal reflux disease)   2. Trouble swallowing       MDM  Patient was evaluated by myself. Based on her symptoms patient did have plain film of the airway as well as of the chest performed given Haldol distally her symptoms appear to be  located. She had normal physical exam aside from baseline finding of palpable lump associated with her thyroid. Patient was breathing easily and speaking in full sentences. Films are performed as her complaints would not have been easily visualized from the oropharynx. As patient also felt her symptoms may be due to reflux I did give her a GI cocktail. Patient did not have significant improvement with GI cocktail. She remained stable here. Plain films returned normal. We discussed that patient may have a viral pharyngitis and that the lump she is feeling may be related to possible lymphadenopathy. She did not have significant swelling that was concerning today. Patient was restarted on her Nexium that she taken previously. She also is advised to use saltwater gargles. Patient was discharged in good condition and comfortable with plan.        Cyndra Numbers, MD 03/15/11 [phone]

## 2011-05-06 ENCOUNTER — Ambulatory Visit (INDEPENDENT_AMBULATORY_CARE_PROVIDER_SITE_OTHER): Payer: Self-pay | Admitting: Family Medicine

## 2011-05-06 ENCOUNTER — Encounter: Payer: Self-pay | Admitting: Internal Medicine

## 2011-05-06 ENCOUNTER — Encounter: Payer: Self-pay | Admitting: Family Medicine

## 2011-05-06 VITALS — BP 128/80 | HR 78 | Ht 69.0 in | Wt 267.0 lb

## 2011-05-06 DIAGNOSIS — Z139 Encounter for screening, unspecified: Secondary | ICD-10-CM

## 2011-05-06 DIAGNOSIS — Z209 Contact with and (suspected) exposure to unspecified communicable disease: Secondary | ICD-10-CM

## 2011-05-06 NOTE — Progress Notes (Signed)
  Subjective:    Patient ID: Denise Schroeder, female    DOB: [birth year], 46 y.o.   MRN: [id number]  HPI She was informed by her long-term partner and he currently has hepatitis B. She is concerned over this and would like to have further testing for this and for other possible STDs.   Review of Systems     Objective:   Physical Exam Alert and in no distress otherwise not examined       Assessment & Plan:   1. Contact with or exposure to unspecified communicable disease  Hepatitis B surface antibody, HIV Antibody, RPR  2. Screening for unspecified condition  Hepatitis B surface antibody, HIV Antibody, RPR

## 2011-05-07 LAB — HIV ANTIBODY (ROUTINE TESTING W REFLEX): HIV: NONREACTIVE

## 2011-05-07 LAB — RPR

## 2011-08-04 ENCOUNTER — Encounter: Payer: Self-pay | Admitting: Medical

## 2011-08-04 ENCOUNTER — Ambulatory Visit (INDEPENDENT_AMBULATORY_CARE_PROVIDER_SITE_OTHER): Payer: Self-pay | Admitting: Medical

## 2011-08-04 VITALS — BP 100/70 | HR 80 | Temp 98.4°F | Resp 16 | Wt 272.0 lb

## 2011-08-04 DIAGNOSIS — E039 Hypothyroidism, unspecified: Secondary | ICD-10-CM

## 2011-08-04 DIAGNOSIS — E669 Obesity, unspecified: Secondary | ICD-10-CM

## 2011-08-04 DIAGNOSIS — R21 Rash and other nonspecific skin eruption: Secondary | ICD-10-CM

## 2011-08-04 DIAGNOSIS — Z79899 Other long term (current) drug therapy: Secondary | ICD-10-CM

## 2011-08-04 LAB — LIPID PANEL
Cholesterol: 174 mg/dL (ref 0–200)
Total CHOL/HDL Ratio: 3.3 Ratio
Triglycerides: 59 mg/dL (ref <150)
VLDL: 12 mg/dL (ref 0–40)

## 2011-08-04 LAB — CBC WITH DIFFERENTIAL/PLATELET
Basophils Absolute: 0 10*3/uL (ref 0.0–0.1)
Basophils Relative: 0 % (ref 0–1)
Eosinophils Absolute: 0.3 10*3/uL (ref 0.0–0.7)
MCH: 28.2 pg (ref 26.0–34.0)
MCHC: 32.2 g/dL (ref 30.0–36.0)
Neutrophils Relative %: 48 % (ref 43–77)
Platelets: 238 10*3/uL (ref 150–400)
RBC: 3.79 MIL/uL — ABNORMAL LOW (ref 3.87–5.11)
RDW: 14.9 % (ref 11.5–15.5)

## 2011-08-04 LAB — COMPREHENSIVE METABOLIC PANEL
ALT: 12 U/L (ref 0–35)
AST: 16 U/L (ref 0–37)
Creat: 0.88 mg/dL (ref 0.50–1.10)
Total Bilirubin: 0.4 mg/dL (ref 0.3–1.2)

## 2011-08-04 LAB — T4: T4, Total: 12.5 ug/dL (ref 5.0–12.5)

## 2011-08-04 LAB — RPR

## 2011-08-04 LAB — TSH: TSH: 2.021 u[IU]/mL (ref 0.350–4.500)

## 2011-08-04 MED ORDER — TRIAMCINOLONE ACETONIDE 0.1 % EX CREA: TOPICAL_CREAM | Freq: Two times a day (BID) | CUTANEOUS | Status: DC

## 2011-08-04 NOTE — Progress Notes (Signed)
  Subjective:   HPI  Denise Schroeder is a 46 y.o. female who presents for general recheck.   She sees gynecology for routine gyn care and for uterine fibroids.  She came by here earlier this morning for fasting labs.  She wants screening labs today.   She notes 2 c/o.  She notes rash of left elbow that comes in the spring, in the past has used steroid cream from dermatology that worked fine. She notes concern for weight, wants to lose weight.   Current eats what she wants.  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  She does drink at least one sweet tea daily.     No other c/o.  The following portions of the patient's history were reviewed and updated as appropriate: allergies, current medications, past family history, past medical history, past social history, past surgical history and problem list.  Past Medical History  Diagnosis Date  . GERD (gastroesophageal reflux disease)   . Anemia   . Allergy   . Hashimoto thyroiditis   . Goiter   . Obesity     Allergies  Allergen Reactions  . Citrus Itching, Swelling and Cough    All fruits cause this reaction  . Latex Hives     Review of Systems ROS reviewed and was negative other than noted in HPI or above.    Objective:   Physical Exam  General appearance: alert, no distress, WD/WN Skin: left elbow with raised rough erythema patch, suggestive of eczema Oral cavity: MMM, no lesions Neck: supple, no lymphadenopathy, mild generalized goiter, no masses Heart: RRR, normal S1, S2, no murmurs Lungs: CTA bilaterally, no wheezes, rhonchi, or rales Abdomen: +bs, soft, non tender, non distended, no masses, no hepatomegaly, no splenomegaly Pulses: 2+ symmetric, upper and lower extremities, normal cap refill   Assessment and Plan :     Encounter Diagnoses  Name Primary?  . Hypothyroidism Yes  . High risk medication use    . Obesity   . Rash    Hypothyroidism - labs today, c/t same medication.  reviewed prior thyroid ultrasound and FNA results from 2008 and prior labs.   High risk medication use - labs today  Obesity - discussed diet, exercise, recommend she consider Ipad app for weigh loss such as "lose it", advised exercise changes, lifestyle change and try and lose weight.   Rash - script for triamcinolone cream for left elbow rash - atopic dermatitis

## 2011-08-11 ENCOUNTER — Other Ambulatory Visit: Payer: Self-pay | Admitting: Family Medicine

## 2011-08-18 ENCOUNTER — Other Ambulatory Visit: Payer: Self-pay | Admitting: Family Medicine

## 2011-08-18 ENCOUNTER — Other Ambulatory Visit: Payer: Self-pay | Admitting: Medical

## 2011-08-18 DIAGNOSIS — D649 Anemia, unspecified: Secondary | ICD-10-CM

## 2011-10-02 ENCOUNTER — Encounter: Payer: Self-pay | Admitting: Medical

## 2011-10-02 ENCOUNTER — Ambulatory Visit (INDEPENDENT_AMBULATORY_CARE_PROVIDER_SITE_OTHER): Payer: Self-pay | Admitting: Medical

## 2011-10-02 VITALS — BP 120/80 | HR 68 | Temp 98.2°F | Resp 16

## 2011-10-02 DIAGNOSIS — R300 Dysuria: Secondary | ICD-10-CM

## 2011-10-02 DIAGNOSIS — D649 Anemia, unspecified: Secondary | ICD-10-CM

## 2011-10-02 DIAGNOSIS — Z1389 Encounter for screening for other disorder: Secondary | ICD-10-CM

## 2011-10-02 DIAGNOSIS — Z139 Encounter for screening, unspecified: Secondary | ICD-10-CM

## 2011-10-02 DIAGNOSIS — D259 Leiomyoma of uterus, unspecified: Secondary | ICD-10-CM

## 2011-10-02 LAB — POCT URINALYSIS DIPSTICK
Blood, UA: NEGATIVE
Glucose, UA: NEGATIVE
Ketones, UA: NEGATIVE
Spec Grav, UA: 1.015
Urobilinogen, UA: NEGATIVE

## 2011-10-02 MED ORDER — CIPROFLOXACIN HCL 500 MG PO TABS: 500.0000 mg | ORAL_TABLET | Freq: Two times a day (BID) | ORAL | Status: AC

## 2011-10-02 NOTE — Progress Notes (Signed)
Subjective:   HPI  Denise Schroeder is a 46 y.o. female who presents with possible UTI.  She reports some pain in back, pain when lying on the side, urinary frequency.  Has a heaviness in the left flank, nausea.  Some urgency.  Did see some blood in urine.  LMP 3 wk ago.   She notes some mucous like vaginal discharge.  Had some itching in the vaginal area, but was on antibiotic recently.  Ended up taking Diflucan per gynecology.  Denies fever, vomiting, diarrhea.   Been walking for exercise, but hasn't done anything to strain the back.  No other aggravating or relieving factors.  No concern for STD.  In a monogamous relationship.   She does have hx/o anemia, heavy periods and uterine fibroids . Takes iron daily.  Has f/u with gynecology next month.   No other c/o.  The following portions of the patient's history were reviewed and updated as appropriate: allergies, current medications, past family history, past medical history, past social history, past surgical history and problem list.  Past Medical History  Diagnosis Date  . GERD (gastroesophageal reflux disease)   . Anemia   . Allergy   . Hashimoto thyroiditis   . Goiter   . Obesity     Allergies  Allergen Reactions  . Citrus Itching, Swelling and Cough    All fruits cause this reaction  . Latex Hives     Review of Systems ROS reviewed and was negative other than noted in HPI or above.    Objective:   Physical Exam  General appearance: alert, no distress, WD/WN Heart: RRR, normal S1, S2, no murmurs Lungs: CTA bilaterally, no wheezes, rhonchi, or rales Abdomen: +bs, soft, mild left lower and left mid abdominal tenderness, non distended, no masses, no hepatomegaly, no splenomegaly Pulses: 2+ symmetric Back: no CVA tenderness   Assessment and Plan :     Encounter Diagnoses  Name Primary?  . Dysuria Yes  . Anemia   . Uterine fibroid   . Screening for blood or protein in urine    Urinalysis unremarkable.  Given  similar hx/o and +infection in the fall, will treat empirically with Cipro for UTI, but will await culture.  She has f/u with gynecology next month regarding fibroids, ovarian cyst. I suspect her anemia is due to heavy periods.  C/t iron daily.  If needed after she sees gynecology, we can do additional evaluation for anemia.

## 2011-10-05 ENCOUNTER — Telehealth: Payer: Self-pay | Admitting: Family Medicine

## 2011-10-05 LAB — URINE CULTURE: Colony Count: >=100000

## 2011-10-05 NOTE — Telephone Encounter (Signed)
Patient was notified of her UA results. CLS

## 2011-10-05 NOTE — Telephone Encounter (Signed)
Message copied by Janeice Robinson on Mon Oct 05, 2011  9:08 AM ------      Message from: Jac Canavan      Created: Mon Oct 05, 2011  8:37 AM       Urine was + for infection.  Finish the Cipro antibiotic

## 2011-11-15 ENCOUNTER — Emergency Department (HOSPITAL_COMMUNITY): Payer: No Typology Code available for payment source

## 2011-11-15 ENCOUNTER — Emergency Department (HOSPITAL_COMMUNITY)
Admission: EM | Admit: 2011-11-15 | Discharge: 2011-11-15 | Disposition: A | Discharge status: Home / Self Care | Payer: No Typology Code available for payment source | Attending: Emergency Medicine | Admitting: Emergency Medicine

## 2011-11-15 ENCOUNTER — Encounter (HOSPITAL_COMMUNITY): Payer: Self-pay | Admitting: Emergency Medicine

## 2011-11-15 DIAGNOSIS — S139XXA Sprain of joints and ligaments of unspecified parts of neck, initial encounter: Secondary | ICD-10-CM | POA: Insufficient documentation

## 2011-11-15 DIAGNOSIS — M542 Cervicalgia: Secondary | ICD-10-CM | POA: Insufficient documentation

## 2011-11-15 MED ORDER — ACETAMINOPHEN 325 MG PO TABS
650.0000 mg | ORAL_TABLET | Freq: Once | ORAL | Status: AC
  Administered 2011-11-15: 650 mg via ORAL
  Filled 2011-11-15: qty 2

## 2011-11-15 MED ORDER — ONDANSETRON 8 MG PO TBDP
8.0000 mg | ORAL_TABLET | Freq: Once | ORAL | Status: AC
  Administered 2011-11-15: 8 mg via ORAL
  Filled 2011-11-15: qty 1

## 2011-11-15 MED ORDER — METHOCARBAMOL 500 MG PO TABS: 500.0000 mg | ORAL_TABLET | Freq: Two times a day (BID) | ORAL | Status: AC

## 2011-11-15 MED ORDER — IBUPROFEN 600 MG PO TABS: 600.0000 mg | ORAL_TABLET | Freq: Four times a day (QID) | ORAL | Status: AC | PRN

## 2011-11-15 NOTE — ED Notes (Signed)
Bed:WA03<BR> Expected date:<BR> Expected time:<BR> Means of arrival:<BR> Comments:<BR>

## 2011-11-15 NOTE — ED Notes (Signed)
Patient transported to X-ray 
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
## 2011-11-18 ENCOUNTER — Ambulatory Visit (INDEPENDENT_AMBULATORY_CARE_PROVIDER_SITE_OTHER): Payer: Self-pay | Admitting: Medical

## 2011-11-18 ENCOUNTER — Telehealth: Payer: Self-pay | Admitting: Family Medicine

## 2011-11-18 ENCOUNTER — Encounter: Payer: Self-pay | Admitting: Medical

## 2011-11-18 VITALS — BP 108/70 | HR 80 | Temp 98.1°F | Resp 16 | Wt 274.0 lb

## 2011-11-18 DIAGNOSIS — R209 Unspecified disturbances of skin sensation: Secondary | ICD-10-CM

## 2011-11-18 DIAGNOSIS — R202 Paresthesia of skin: Secondary | ICD-10-CM

## 2011-11-18 DIAGNOSIS — M79609 Pain in unspecified limb: Secondary | ICD-10-CM

## 2011-11-18 DIAGNOSIS — M62838 Other muscle spasm: Secondary | ICD-10-CM

## 2011-11-18 DIAGNOSIS — M79601 Pain in right arm: Secondary | ICD-10-CM

## 2011-11-18 DIAGNOSIS — M542 Cervicalgia: Secondary | ICD-10-CM

## 2011-11-18 MED ORDER — TRAMADOL HCL 50 MG PO TABS: 50.0000 mg | ORAL_TABLET | Freq: Three times a day (TID) | ORAL | Status: AC | PRN

## 2011-11-18 NOTE — Telephone Encounter (Signed)
Patient is aware of her appointment on 11/19/11 @ 845 am @ [facility] [phone]
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
## 2011-12-01 ENCOUNTER — Telehealth: Payer: Self-pay | Admitting: Family Medicine

## 2011-12-01 ENCOUNTER — Other Ambulatory Visit (INDEPENDENT_AMBULATORY_CARE_PROVIDER_SITE_OTHER): Payer: Self-pay

## 2011-12-01 DIAGNOSIS — R319 Hematuria, unspecified: Secondary | ICD-10-CM

## 2011-12-01 LAB — POCT URINALYSIS DIPSTICK
Leukocytes, UA: NEGATIVE
Nitrite, UA: NEGATIVE
Protein, UA: NEGATIVE
Urobilinogen, UA: NEGATIVE

## 2011-12-01 NOTE — Telephone Encounter (Signed)
Have her drop off as specimen and let her know we will definitely call something in but we will probably need to repeat the culture and see her back in 2 weeks to make sure she is clear. Call in Cipro 500 mg twice a day 20 tabs

## 2011-12-01 NOTE — Telephone Encounter (Signed)
PT CALLED AND STATED THAT ONCE AGAIN SHE IS SHOWING SYMPTOMS OF A BLADDER INFECTION. SHE STATES THESE ARE REOCCURRING AND THE LAST TIME SHE HAD ONE WAS IN July. SHE USUALLY SEES [facility]. PT IS REQUESTING THAT SOMETHING BE CALLED IN. PT USES [facility] [address]. PLEASE CALL PT AND INFORM OF STATUS.

## 2011-12-01 NOTE — Telephone Encounter (Signed)
PT CAME BY AND UA WAS PERFORMED SO NO MEDS WERE SENT IN

## 2011-12-01 NOTE — Telephone Encounter (Signed)
Pt will come in today

## 2011-12-10 DIAGNOSIS — Z029 Encounter for administrative examinations, unspecified: Secondary | ICD-10-CM

## 2012-02-26 ENCOUNTER — Other Ambulatory Visit: Payer: Self-pay | Admitting: Obstetrics and Gynecology

## 2012-08-23 ENCOUNTER — Other Ambulatory Visit: Payer: Self-pay | Admitting: Medical

## 2012-08-23 NOTE — Progress Notes (Signed)
Patient is aware and has an appointment for 08/31/12. CLS

## 2012-08-26 ENCOUNTER — Other Ambulatory Visit: Payer: Self-pay | Admitting: Medical

## 2012-08-26 ENCOUNTER — Telehealth: Payer: Self-pay | Admitting: Medical

## 2012-08-26 MED ORDER — LEVOTHYROXINE SODIUM 100 MCG PO TABS: 100.0000 ug | ORAL_TABLET | Freq: Every day | ORAL | Status: DC

## 2012-08-26 NOTE — Telephone Encounter (Signed)
Patient is aware and her appointment is 09/05/12. CLS

## 2012-08-26 NOTE — Telephone Encounter (Signed)
Med sent, f/u as planned for med check

## 2012-08-31 ENCOUNTER — Encounter: Payer: Self-pay | Admitting: Medical

## 2012-08-31 ENCOUNTER — Ambulatory Visit (INDEPENDENT_AMBULATORY_CARE_PROVIDER_SITE_OTHER): Payer: Self-pay | Admitting: Medical

## 2012-08-31 VITALS — BP 120/70 | HR 72 | Temp 98.2°F | Resp 16 | Wt 270.0 lb

## 2012-08-31 DIAGNOSIS — E049 Nontoxic goiter, unspecified: Secondary | ICD-10-CM

## 2012-08-31 DIAGNOSIS — E669 Obesity, unspecified: Secondary | ICD-10-CM

## 2012-08-31 DIAGNOSIS — D649 Anemia, unspecified: Secondary | ICD-10-CM

## 2012-08-31 DIAGNOSIS — E039 Hypothyroidism, unspecified: Secondary | ICD-10-CM

## 2012-08-31 LAB — LIPID PANEL
Cholesterol: 175 mg/dL (ref 0–200)
HDL: 57 mg/dL (ref >39)
LDL Cholesterol: 105 mg/dL — ABNORMAL HIGH (ref 0–99)
Triglycerides: 63 mg/dL (ref <150)

## 2012-08-31 LAB — COMPREHENSIVE METABOLIC PANEL
Alkaline Phosphatase: 57 U/L (ref 39–117)
Glucose, Bld: 77 mg/dL (ref 70–99)
Sodium: 139 mEq/L (ref 135–145)
Total Bilirubin: 0.4 mg/dL (ref 0.3–1.2)
Total Protein: 6.8 g/dL (ref 6.0–8.3)

## 2012-08-31 LAB — CBC WITH DIFFERENTIAL/PLATELET
Basophils Absolute: 0 10*3/uL (ref 0.0–0.1)
Basophils Relative: 0 % (ref 0–1)
HCT: 32.9 % — ABNORMAL LOW (ref 36.0–46.0)
MCHC: 32.8 g/dL (ref 30.0–36.0)
Monocytes Absolute: 0.4 10*3/uL (ref 0.1–1.0)
Neutro Abs: 3 10*3/uL (ref 1.7–7.7)
Neutrophils Relative %: 52 % (ref 43–77)
RDW: 15.2 % (ref 11.5–15.5)

## 2012-08-31 LAB — T3 UPTAKE: T3 Uptake: 33.6 % (ref 22.5–37.0)

## 2012-08-31 LAB — T4: T4, Total: 12.4 ug/dL (ref 5.0–12.5)

## 2012-08-31 LAB — FREE THYROXINE INDEX: Free Thyroxine Index: 4.2 — ABNORMAL HIGH (ref 1.0–3.9)

## 2012-08-31 NOTE — Progress Notes (Signed)
  Subjective:   HPI  Denise Schroeder is a 47 y.o. female who presents for general recheck.   She sees gynecology for routine gyn care and for uterine fibroids.   No new concerns, fibroids were thought to be stable and her periods are not heavy.  She has no concerns about her thyroid medication.  compliant with medication.   She is taking iron once daily.  Has hx/o iron deficiency anemia.  No prior colonoscopy.  She denies bleeding bruising, but she does get fatigue from time to time and stays cold .  Has started a day care.  Exhausted at end of the day, but very busy.  The following portions of the patient's history were reviewed and updated as appropriate: allergies, current medications, past family history, past medical history, past social history, past surgical history and problem list.  Past Medical History  Diagnosis Date  . GERD (gastroesophageal reflux disease)   . Anemia   . Allergy   . Hashimoto thyroiditis   . Goiter   . Obesity     Allergies  Allergen Reactions  . Citrus Itching, Swelling and Cough    All fruits cause this reaction  . Latex Hives    Review of Systems ROS reviewed and was negative other than noted in HPI or above.    Objective:   Physical Exam  General appearance: alert, no distress, WD/WN Neck: supple, no lymphadenopathy, mild generalized goiter, no masses Heart: RRR, normal S1, S2, no murmurs Lungs: CTA bilaterally, no wheezes, rhonchi, or rales Abdomen: +bs, soft, non tender, non distended, no masses, no hepatomegaly, no splenomegaly Pulses: 2+ symmetric, upper and lower extremities, normal cap refill   Assessment and Plan :     Encounter Diagnoses  Name Primary?  Marland Kitchen Unspecified hypothyroidism Yes  . Goiter   . Anemia   . Obesity, unspecified    Hypothyroidism - labs today, c/t same medication.  reviewed prior thyroid ultrasound and FNA results from 2008 and prior labs.   Goiter - unchanged  Anemia - she will return stool cards x 3.   Labs today.  Obesity - discussed diet, exercise, need to lose weight.

## 2012-09-01 ENCOUNTER — Other Ambulatory Visit: Payer: Self-pay | Admitting: Medical

## 2012-09-01 MED ORDER — LEVOTHYROXINE SODIUM 88 MCG PO TABS: 88.0000 ug | ORAL_TABLET | Freq: Every day | ORAL | Status: DC

## 2012-09-01 MED ORDER — PHENTERMINE HCL 37.5 MG PO CAPS: 37.5000 mg | ORAL_CAPSULE | ORAL | Status: DC

## 2012-09-01 NOTE — Progress Notes (Signed)
I called the medication out to the pharmacy per [facility] PA-C. CLS

## 2012-10-13 ENCOUNTER — Telehealth: Payer: Self-pay | Admitting: Internal Medicine

## 2012-10-13 NOTE — Telephone Encounter (Signed)
Pt is stating that the phentermine 37.5 is possibly to strong for her that it makes her feel sluggish, and she wants to know if you have a lower dose. She states that it does depress her appetite but it just makes her feel sluggish Send to [facility] [address]

## 2012-10-14 ENCOUNTER — Other Ambulatory Visit: Payer: Self-pay | Admitting: Medical

## 2012-10-14 ENCOUNTER — Other Ambulatory Visit: Payer: Self-pay | Admitting: *Deleted

## 2012-10-14 MED ORDER — PHENTERMINE-TOPIRAMATE ER 7.5-46 MG PO CP24: 1.0000 | ORAL_CAPSULE | Freq: Every day | ORAL | Status: DC

## 2012-10-14 NOTE — Telephone Encounter (Signed)
Phoned in rx x 2 months to [facility], patient will call back and schedule follow up later on today for sometime in the next 2 months-she will stop phentermine.

## 2012-10-17 ENCOUNTER — Telehealth: Payer: Self-pay | Admitting: Internal Medicine

## 2012-10-17 NOTE — Telephone Encounter (Signed)
Pt states when she went to go get the new med it was $200 dollars and pt wants to know if there is something else pt can get. [facility] [address].

## 2012-10-17 NOTE — Telephone Encounter (Signed)
Make sure she has the coupon for Qsymia.   With the coupon, cash pay should only be about $50/mo.

## 2012-10-18 ENCOUNTER — Other Ambulatory Visit: Payer: Self-pay | Admitting: Family Medicine

## 2012-10-18 MED ORDER — PHENTERMINE-TOPIRAMATE ER 7.5-46 MG PO CP24: 1.0000 | ORAL_CAPSULE | Freq: Every day | ORAL | Status: DC

## 2012-10-18 NOTE — Telephone Encounter (Signed)
Patient is aware and I called her medication to the [facility] for her. CLS

## 2012-10-26 ENCOUNTER — Telehealth: Payer: Self-pay | Admitting: Family Medicine

## 2012-10-26 NOTE — Telephone Encounter (Signed)
Have her check insurance coverage for Belviq vs Qysmia at both doses.  There are not really other good options for appetite Supressant.  Let me know once she checks insurance coverage.  Make sure she lets them know she has coupon cards.  We can get her the coupon cards.

## 2012-10-26 NOTE — Telephone Encounter (Signed)
Patient called and said that she went to [facility] to get the Qysmia and the coupon card did not work and the cost was going to be $100.00. CLS What should she do now?

## 2012-10-28 NOTE — Telephone Encounter (Signed)
I spoke with the patient after I spoke with the Qysmia drug Rep. I explain to her that the Rep. Said she should not have been denied. The Rep. Told the patient to call and she would help her get the discount so she could get the medication. The patient is suppose to call the Qysmia drug Rep. For assistance. CLS

## 2012-11-07 ENCOUNTER — Other Ambulatory Visit (INDEPENDENT_AMBULATORY_CARE_PROVIDER_SITE_OTHER): Payer: Self-pay

## 2012-11-07 DIAGNOSIS — D649 Anemia, unspecified: Secondary | ICD-10-CM

## 2012-11-07 LAB — POC HEMOCCULT BLD/STL (HOME/3-CARD/SCREEN)
Card #2 Fecal Occult Blod, POC: NEGATIVE
Card #3 Fecal Occult Blood, POC: NEGATIVE
Fecal Occult Blood, POC: NEGATIVE

## 2012-11-18 ENCOUNTER — Other Ambulatory Visit: Payer: Self-pay

## 2012-11-18 DIAGNOSIS — Z79899 Other long term (current) drug therapy: Secondary | ICD-10-CM

## 2013-01-30 ENCOUNTER — Other Ambulatory Visit: Payer: Self-pay | Admitting: Medical

## 2013-01-30 NOTE — Telephone Encounter (Signed)
PATIENT NEEDS TO SCHEDULE A OFFICE VISIT TO RECHECK ON MEDICATIONS.

## 2013-01-30 NOTE — Telephone Encounter (Signed)
Patient needs refill on synthroid, out of meds

## 2013-03-24 ENCOUNTER — Other Ambulatory Visit: Payer: Self-pay | Admitting: Obstetrics and Gynecology

## 2013-03-30 ENCOUNTER — Other Ambulatory Visit: Payer: Self-pay | Admitting: Medical

## 2013-04-18 ENCOUNTER — Encounter: Payer: Self-pay | Admitting: Medical

## 2013-04-18 ENCOUNTER — Emergency Department (HOSPITAL_COMMUNITY): Payer: BC Managed Care – PPO

## 2013-04-18 ENCOUNTER — Ambulatory Visit (INDEPENDENT_AMBULATORY_CARE_PROVIDER_SITE_OTHER): Payer: BC Managed Care – PPO | Admitting: Medical

## 2013-04-18 ENCOUNTER — Telehealth: Payer: Self-pay | Admitting: Medical

## 2013-04-18 ENCOUNTER — Encounter (HOSPITAL_COMMUNITY): Payer: Self-pay | Admitting: Emergency Medicine

## 2013-04-18 ENCOUNTER — Emergency Department (HOSPITAL_COMMUNITY)
Admission: EM | Admit: 2013-04-18 | Discharge: 2013-04-18 | Disposition: A | Discharge status: Home / Self Care | Payer: BC Managed Care – PPO | Attending: Emergency Medicine | Admitting: Emergency Medicine

## 2013-04-18 VITALS — BP 120/90 | HR 68 | Temp 98.4°F | Resp 18

## 2013-04-18 DIAGNOSIS — Z8719 Personal history of other diseases of the digestive system: Secondary | ICD-10-CM | POA: Insufficient documentation

## 2013-04-18 DIAGNOSIS — E669 Obesity, unspecified: Secondary | ICD-10-CM | POA: Insufficient documentation

## 2013-04-18 DIAGNOSIS — R0602 Shortness of breath: Secondary | ICD-10-CM

## 2013-04-18 DIAGNOSIS — D649 Anemia, unspecified: Secondary | ICD-10-CM | POA: Insufficient documentation

## 2013-04-18 DIAGNOSIS — R079 Chest pain, unspecified: Secondary | ICD-10-CM

## 2013-04-18 DIAGNOSIS — R059 Cough, unspecified: Secondary | ICD-10-CM

## 2013-04-18 DIAGNOSIS — R05 Cough: Secondary | ICD-10-CM

## 2013-04-18 DIAGNOSIS — J40 Bronchitis, not specified as acute or chronic: Secondary | ICD-10-CM

## 2013-04-18 DIAGNOSIS — Z79899 Other long term (current) drug therapy: Secondary | ICD-10-CM | POA: Insufficient documentation

## 2013-04-18 DIAGNOSIS — Z8742 Personal history of other diseases of the female genital tract: Secondary | ICD-10-CM | POA: Insufficient documentation

## 2013-04-18 DIAGNOSIS — J209 Acute bronchitis, unspecified: Secondary | ICD-10-CM | POA: Insufficient documentation

## 2013-04-18 DIAGNOSIS — Z91040 Latex allergy status: Secondary | ICD-10-CM | POA: Insufficient documentation

## 2013-04-18 HISTORY — DX: Leiomyoma of uterus, unspecified: D25.9

## 2013-04-18 LAB — POCT I-STAT, CHEM 8
BUN: 17 mg/dL (ref 6–23)
CHLORIDE: 107 meq/L (ref 96–112)
Calcium, Ion: 1.18 mmol/L (ref 1.12–1.23)
Creatinine, Ser: 0.9 mg/dL (ref 0.50–1.10)
Glucose, Bld: 80 mg/dL (ref 70–99)
HCT: 32 % — ABNORMAL LOW (ref 36.0–46.0)
HEMOGLOBIN: 10.9 g/dL — AB (ref 12.0–15.0)
POTASSIUM: 4.8 meq/L (ref 3.7–5.3)
SODIUM: 140 meq/L (ref 137–147)
TCO2: 24 mmol/L (ref 0–100)

## 2013-04-18 LAB — POCT I-STAT TROPONIN I: Troponin i, poc: 0 ng/mL (ref 0.00–0.08)

## 2013-04-18 MED ORDER — HYDROCODONE-HOMATROPINE 5-1.5 MG/5ML PO SYRP: 5.0000 mL | ORAL_SOLUTION | Freq: Four times a day (QID) | ORAL | Status: DC | PRN

## 2013-04-18 MED ORDER — AZITHROMYCIN 250 MG PO TABS: ORAL_TABLET | ORAL | Status: DC

## 2013-04-18 NOTE — ED Provider Notes (Signed)
CSN: [id number]     Arrival date & time 04/18/13  1131 History   First MD Initiated Contact with Patient 04/18/13 1140     Chief Complaint  Patient presents with  . Chest Pain    HPI Patient presents with some left posterior chest pain which is worse with deep breathing and coughing.  Patient's had a nonproductive cough for the last few days.  Patient has no significant coronary risk factors.  She have a air flight from [place] but that was early January.  Patient has had no fever or chills.  No leg swelling.  She is PERC neg with HEART SCORE=1  Past Medical History  Diagnosis Date  . GERD (gastroesophageal reflux disease)   . Anemia   . Allergy   . Hashimoto thyroiditis   . Goiter   . Obesity   . Uterine fibroid    Past Surgical History  Procedure Laterality Date  . Cholecystectomy     History reviewed. No pertinent family history. History  Substance Use Topics  . Smoking status: Never Smoker   . Smokeless tobacco: Never Used  . Alcohol Use: No   OB History   Grav Para Term Preterm Abortions TAB SAB Ect Mult Living                 Review of Systems  All other systems reviewed and are negative.    Allergies  Citrus and Latex  Home Medications   Current Outpatient Rx  Name  Route  Sig  Dispense  Refill  . CALCIUM PO   Oral   Take 2 tablets by mouth daily.         Marland Kitchen ibuprofen (ADVIL,MOTRIN) 200 MG tablet   Oral   Take 600-800 mg by mouth every 6 (six) hours as needed (for pain).         . IRON PO   Oral   Take 1 tablet by mouth daily.         Marland Kitchen levothyroxine (SYNTHROID, LEVOTHROID) 100 MCG tablet      take 1 tablet by mouth once daily   30 tablet   0   . Multiple Vitamin (MULTIVITAMIN WITH MINERALS) TABS   Oral   Take 1 tablet by mouth daily.         Marland Kitchen azithromycin (ZITHROMAX Z-PAK) 250 MG tablet      Take 2 capsules on day #1, then one capsule a day until gone   6 tablet   0   . HYDROcodone-homatropine (HYCODAN) 5-1.5 MG/5ML syrup    Oral   Take 5 mLs by mouth every 6 (six) hours as needed for cough.   120 mL   0    BP 118/49  Pulse 73  Temp(Src) 98.9 F (37.2 C) (Oral)  Resp 21  Ht 5\' 9"  (1.753 m)  Wt 270 lb (122.471 kg)  BMI 39.85 kg/m2  SpO2 100%  LMP 04/03/2013 Physical Exam  Nursing note and vitals reviewed. Constitutional: She is oriented to person, place, and time. She appears well-developed and well-nourished. No distress.  HENT:  Head: Normocephalic and atraumatic.  Eyes: Pupils are equal, round, and reactive to light.  Neck: Normal range of motion.  Cardiovascular: Normal rate and intact distal pulses.   Pulmonary/Chest: No respiratory distress.  Abdominal: Normal appearance. She exhibits no distension.  Musculoskeletal: Normal range of motion.  Neurological: She is alert and oriented to person, place, and time. No cranial nerve deficit.  Skin: Skin is warm and dry. No  rash noted.  Psychiatric: She has a normal mood and affect. Her behavior is normal.    ED Course  Procedures (including critical care time) Labs Review Labs Reviewed  POCT I-STAT, CHEM 8 - Abnormal; Notable for the following:    Hemoglobin 10.9 (*)    HCT 32.0 (*)    All other components within normal limits  POCT I-STAT TROPONIN I   Imaging Review Dg Chest 2 View  04/18/2013   CLINICAL DATA:  Cough, chest pain and shortness of breath.  EXAM: CHEST - 2 VIEW  COMPARISON:  None  FINDINGS: The heart size and mediastinal contours are within normal limits. Some bronchial thickening present without evidence of focal pulmonary consolidation. No edema or pleural fluid identified. The visualized skeletal structures are unremarkable.  IMPRESSION: Bronchial thickening without focal infiltrate.   Electronically Signed   By: Aletta Edouard M.D.   On: 04/18/2013 12:48    EKG Interpretation    Date/Time:  Tuesday April 18 2013 11:40:19 EST Ventricular Rate:  66 PR Interval:  151 QRS Duration: 82 QT Interval:  414 QTC  Calculation: 434 R Axis:   66 Text Interpretation:  Sinus rhythm Borderline T abnormalities, anterior leads No previous tracing Confirmed by Natanya Holecek  MD, Louine Tenpenny ([id number]) on 04/18/2013 12:43:25 PM            MDM   1. Bronchitis        Dot Lanes, MD 04/18/13 1321

## 2013-04-18 NOTE — Telephone Encounter (Signed)
Pt came in for appt. 

## 2013-04-18 NOTE — Progress Notes (Signed)
Subjective:    Denise Schroeder is a 48 y.o. female who presents for evaluation of chest pain.  Her sister brought her in.  Has had cough and chest congestion x 2 weeks, but chest discomfort/chest pain/worse SOB abruptly started 2 hours ago. Symptoms have been unchanged since that time. The patient describes the pain as tightness and does not radiate. Patient rates pain as a 8/10 in intensity. Associated symptoms are: chest pressure/discomfort and dyspnea. Aggravating factors are: coughing. Alleviating factors are: none. Patient's cardiac risk factors are: obesity (BMI >= 30 kg/m2). Patient's risk factors for DVT/PE: long travel around christmas time by car. Previous cardiac testing: rhythm strip by EMS this morning which she brought with her.  EMS came out this morning but after rhythm strip and discussion, she decided not to go to the ED.  She does report cough and chest congestion the last 2 weeks.  Has had head congestion, decreased taste, sinus pressure.  Been using dayquil.  Dry cough, tight cough.  Has used inhaler briefly for a bronchitis in the past, but no hx/o asthma.  She had planned to call last week for inhaler refill given some tightness of late.  No hx/o heart problems.    The following portions of the patient's history were reviewed and updated as appropriate: allergies, current medications, past family history, past medical history, past social history, past surgical history and problem list.   Review of Systems Constitutional: denies fever, +chills, sweats, unexpected weight change, anorexia, +fatigue ENT: + runny nose, +ear pain,+ sore throat, hoarseness,+ sinus pain, hearing loss, epistaxis Cardiology: denies palpitations,- edema, +orthopnea Gastroenterology: denies abdominal pain, nausea, vomiting, diarrhea, constipation,  Hematology: denies bleeding or bruising problems Musculoskeletal: denies arthralgias, myalgias, joint swelling, back pain, neck pain, cramping, gait  changes Urology: denies dysuria, difficulty urinating, hematuria, urinary frequency, urgency, incontinence Neurology: no headache, weakness, tingling, numbness, speech abnormality, memory loss, falls, dizziness   Objective:    Filed Vitals:   04/18/13 1036  BP: 120/90  Pulse: 68  Temp: 98.4 F (36.9 C)  Resp: 18    General appearance: alert, no distress, WD/WN, female  HEENT: normocephalic, conjunctiva/corneas normal, sclerae anicteric, PERRLA, EOMi, nares patent, no discharge or erythema, pharynx with mild erythema Oral cavity: MMM, tongue normal Neck: supple, no lymphadenopathy, no thyromegaly, no JVD, no masses Chest: non tender, normal shape and expansion Heart: RRR, normal S1, S2, no murmurs Lungs: decreased breath sounds, no wheezes, rhonchi, or rales Abdomen: +bs, soft, non tender, non distended, no masses, no hepatomegaly, no splenomegaly, no bruits Back: non tender, normal ROM, no scoliosis Extremities: no edema, no cyanosis, no clubbing Pulses: 2+ symmetric, upper and lower extremities, normal cap refill Neurological: alert, oriented x 3, nonfocal    Adult ECG Report  Indication: chest pain, SOB  Rate: 57bpm  Rhythm: sinus bradycardia  QRS Axis: 42 degrees  PR Interval: 168ms  QRS Duration: 70ms  QTc: 455ms  Conduction Disturbances: none  Other Abnormalities: T wave inversion V3, flat T waves in general, somewhat late R wave progression  Patient's cardiac risk factors are: obesity (BMI >= 30 kg/m2).  EKG comparison: 1/08, no obvious acute changes  Narrative Interpretation: sinus bradycardia, flat T waves, but no obvious new changes    Assessment:   Encounter Diagnoses  Name Primary?  . Chest pain Yes  . SOB (shortness of breath)   . Cough      Plan:   Given the abrupt onset of new chest pain, shortness of breath this morning,  8/10 pain now, pulse ox 94%, can't rule out cardiac source.  Discussed case with Dr. Redmond School.  She is refusing to go to the ED  by EMS.  Her sister will take her to the ED now.  I called and advised [facility] triage ED that she is on the way for CP/SOB.  Rule out PE, acute coronary syndrome.

## 2013-04-18 NOTE — Discharge Instructions (Signed)
Bronchitis °Bronchitis is swelling (inflammation) of the air tubes leading to your lungs (bronchi). This causes mucus and a cough. If the swelling gets bad, you may have trouble breathing. °HOME CARE  °· Rest. °· Drink enough fluids to keep your pee (urine) clear or pale yellow (unless you have a condition where you have to watch how much you drink). °· Only take medicine as told by your doctor. If you were given antibiotic medicines, finish them even if you start to feel better. °· Avoid smoke, irritating chemicals, and strong smells. These make the problem worse. Quit smoking if you smoke. This helps your lungs heal faster. °· Use a cool mist humidifier. Change the water in the humidifier every day. You can also sit in the bathroom with hot shower running for 5 10 minutes. Keep the door closed. °· See your health care provider as told. °· Wash your hands often. °GET HELP IF: °Your problems do not get better after 1 week. °GET HELP RIGHT AWAY IF:  °· Your fever gets worse. °· You have chills. °· Your chest hurts. °· Your problems breathing get worse. °· You have blood in your mucus. °· You pass out (faint). °· You feel lightheaded. °· You have a bad headache. °· You throw up (vomit) again and again. °MAKE SURE YOU: °· Understand these instructions. °· Will watch your condition. °· Will get help right away if you are not doing well or get worse. °Document Released: 08/19/2007 Document Revised: 12/21/2012 Document Reviewed: 10/25/2012 °[employer]® Patient Information ©2014 [employer]. ° °

## 2013-04-18 NOTE — ED Notes (Signed)
Pt presents by POV from PCP office with c/o CP since yesterday. Pt reports she has had a cold with non-productive cough and SOB for a few days and flew from [place] to [place] in January. Pt alert and orientedx4, in NAD.

## 2013-04-18 NOTE — Telephone Encounter (Signed)
Pt came in, but was subsequently sent to the ED

## 2013-04-18 NOTE — ED Notes (Signed)
Contacted [facility] regarding delay. Updated Benjamine Mola RN

## 2013-04-18 NOTE — Telephone Encounter (Signed)
Pt sd EMT had to be called to her house for her this morning & they just left 45mins ago. They suggested that pt get bloodwork. She does not have lab orders. Pt says orders would be put in [facility] system & pt wants to know if she can come here today to get labs done. Also says she has copy of an EKG that was done by EMT today that she will get to [facility].

## 2013-04-28 ENCOUNTER — Encounter: Payer: Self-pay | Admitting: Medical

## 2013-05-06 ENCOUNTER — Other Ambulatory Visit: Payer: Self-pay | Admitting: Medical

## 2013-05-08 ENCOUNTER — Other Ambulatory Visit: Payer: Self-pay | Admitting: Medical

## 2013-05-08 ENCOUNTER — Telehealth: Payer: Self-pay | Admitting: Medical

## 2013-05-08 MED ORDER — LEVOTHYROXINE SODIUM 100 MCG PO TABS: 100.0000 ug | ORAL_TABLET | Freq: Every day | ORAL | Status: DC

## 2013-05-08 NOTE — Telephone Encounter (Signed)
i sent refills.  We rely to some extent on pharmacies to let us know when up for refill or running out.  Please ask her to verify her pharmacy will send [place] request when time to refill.

## 2013-05-10 NOTE — Telephone Encounter (Signed)
PT INFORMED

## 2013-07-12 ENCOUNTER — Encounter: Payer: Self-pay | Admitting: Medical

## 2013-07-12 ENCOUNTER — Ambulatory Visit (INDEPENDENT_AMBULATORY_CARE_PROVIDER_SITE_OTHER): Payer: BC Managed Care – PPO | Admitting: Medical

## 2013-07-12 VITALS — BP 110/78 | HR 74 | Temp 99.1°F | Resp 16 | Wt 269.0 lb

## 2013-07-12 DIAGNOSIS — J029 Acute pharyngitis, unspecified: Secondary | ICD-10-CM

## 2013-07-12 NOTE — Progress Notes (Signed)
Subjective:  Denise Schroeder is a 48 y.o. female who presents for evaluation of sore throat.  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  Associated symptoms include 3 days of sore throat, low grade fever, headache, but no ear pain, cough, sinus pressure, nasal congestion, itchy watery eyes, sneezing, shortness of breath, wheezing.  Treatment to date: OTC analgesics.  No sick contacts.  No other aggravating or relieving factors.  No other c/o.  The following portions of the patient's history were reviewed and updated as appropriate: allergies, current medications, past medical history, past social history, past surgical history and problem list.  ROS as in subjective   Objective: Filed Vitals:   07/12/13 1120  BP: 110/78  Pulse: 74  Temp: 99.1 F (37.3 C)  Resp: 16    General appearance: no distress, WD/WN, somewhat ill-appearing HEENT: normocephalic, conjunctiva/corneas normal, sclerae anicteric, nares patent, no discharge or erythema, pharynx with mild erythema, no exudate.  Oral cavity: MMM, no lesions   Neck: supple, shoddy tender nodes anteriorly, no thyromegaly Lungs: CTA bilaterally, no wheezes, rhonchi, or rales   Laboratory Strep test done. Results:negative.    Assessment and Plan: Encounter Diagnosis  Name Primary?  . Sore throat Yes    Advised that symptoms and exam suggest a viral etiology.  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  Discussed symptomatic treatment including salt water gargles, warm fluids, rest, hydrate well, can use over-the-counter Tylenol or ibuprofen for throat pain, fever, or malaise. If worse or not improving within 2-3 days, call or return.

## 2013-07-12 NOTE — Addendum Note (Signed)
Addended by: Armanda Magic on: 07/12/2013 04:20 PM   Modules accepted: Orders

## 2013-07-12 NOTE — Patient Instructions (Signed)

## 2013-07-14 LAB — CULTURE, GROUP A STREP: Organism ID, Bacteria: NORMAL

## 2013-08-24 ENCOUNTER — Telehealth: Payer: Self-pay | Admitting: Medical

## 2013-08-24 NOTE — Telephone Encounter (Signed)
Yes, recommend fasting for labs

## 2013-08-24 NOTE — Telephone Encounter (Signed)
Patient is coming in on 6/17 for med check and needs to know if she needs to be fasting or not  Please call

## 2013-08-30 ENCOUNTER — Other Ambulatory Visit: Payer: Self-pay | Admitting: Family Medicine

## 2013-08-30 ENCOUNTER — Telehealth: Payer: Self-pay | Admitting: Medical

## 2013-08-30 ENCOUNTER — Encounter: Payer: Self-pay | Admitting: Medical

## 2013-08-30 ENCOUNTER — Ambulatory Visit (INDEPENDENT_AMBULATORY_CARE_PROVIDER_SITE_OTHER): Payer: BC Managed Care – PPO | Admitting: Medical

## 2013-08-30 VITALS — BP 112/80 | HR 68 | Temp 98.1°F | Resp 16 | Wt 268.0 lb

## 2013-08-30 DIAGNOSIS — D649 Anemia, unspecified: Secondary | ICD-10-CM

## 2013-08-30 DIAGNOSIS — E669 Obesity, unspecified: Secondary | ICD-10-CM

## 2013-08-30 DIAGNOSIS — E039 Hypothyroidism, unspecified: Secondary | ICD-10-CM

## 2013-08-30 DIAGNOSIS — E049 Nontoxic goiter, unspecified: Secondary | ICD-10-CM

## 2013-08-30 LAB — COMPREHENSIVE METABOLIC PANEL
ALT: 9 U/L (ref 0–35)
AST: 13 U/L (ref 0–37)
Albumin: 3.8 g/dL (ref 3.5–5.2)
Alkaline Phosphatase: 54 U/L (ref 39–117)
BILIRUBIN TOTAL: 0.3 mg/dL (ref 0.2–1.2)
BUN: 16 mg/dL (ref 6–23)
CO2: 24 mEq/L (ref 19–32)
CREATININE: 0.9 mg/dL (ref 0.50–1.10)
Calcium: 9.2 mg/dL (ref 8.4–10.5)
Chloride: 106 mEq/L (ref 96–112)
Glucose, Bld: 76 mg/dL (ref 70–99)
Potassium: 4.3 mEq/L (ref 3.5–5.3)
Sodium: 137 mEq/L (ref 135–145)
Total Protein: 6.8 g/dL (ref 6.0–8.3)

## 2013-08-30 LAB — CBC WITH DIFFERENTIAL/PLATELET
BASOS ABS: 0 10*3/uL (ref 0.0–0.1)
Basophils Relative: 0 % (ref 0–1)
EOS PCT: 4 % (ref 0–5)
Eosinophils Absolute: 0.2 10*3/uL (ref 0.0–0.7)
HEMATOCRIT: 29.9 % — AB (ref 36.0–46.0)
Hemoglobin: 9.9 g/dL — ABNORMAL LOW (ref 12.0–15.0)
Lymphocytes Relative: 40 % (ref 12–46)
Lymphs Abs: 2 10*3/uL (ref 0.7–4.0)
MCH: 27.7 pg (ref 26.0–34.0)
MCHC: 33.1 g/dL (ref 30.0–36.0)
MCV: 83.8 fL (ref 78.0–100.0)
MONO ABS: 0.5 10*3/uL (ref 0.1–1.0)
Monocytes Relative: 11 % (ref 3–12)
Neutro Abs: 2.2 10*3/uL (ref 1.7–7.7)
Neutrophils Relative %: 45 % (ref 43–77)
PLATELETS: 252 10*3/uL (ref 150–400)
RBC: 3.57 MIL/uL — ABNORMAL LOW (ref 3.87–5.11)
RDW: 15.2 % (ref 11.5–15.5)
WBC: 4.9 10*3/uL (ref 4.0–10.5)

## 2013-08-30 NOTE — Patient Instructions (Signed)
We will call with lab results.  Check insurance coverage for Belviq or Contrave which are both weight loss medications.

## 2013-08-30 NOTE — Progress Notes (Signed)
Subjective Here for f/u on several issues.   Hypothyroidism/Goiter - Reports compliance with medication. States that she believes her goiter has reduced in size. Admits feeling cold. Denies feeling palpitations, sweating, tremors, increased appetite. Wants to know if she can decrease her dose.   Obesity - unable to tolerate phentermine in the past. Qsymia was too expensive and she did not qualify for the discount. Is eating healthy: salads, shakes at home, vegetables, fruits, generally avoids fatty, greasy foods. Reports that she takes an OTC herbal medicine twice a week that "cleans out her system". 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  Anemia - Reports taking Iron supplement. Has a history of fibroids, sees a gynecologist annually. At her last gyn visit, her doctor recommended medication to stop her cycle. But she was unwilling considering the potential side effects. She continues to have irregular cycles but her bleeding is not as heavy. Generally she may have 1-2 days of heavy bleeding and have since been more stable since April. Her cycles have generally lasted about 5 days. Denies blood in stool, has not had a colonoscopy but will have it done at 48 y/o. She denies a family history of colon cancer.   Has had her mammogram last year - no abnormal findings. In 2012, there was abnormal finding that turned out to be fatty tissue. She continues to have yearly mammograms.  Objective:  BP 112/80  Pulse 68  Temp(Src) 98.1 F (36.7 C) (Oral)  Resp 16  Wt 268 lb (121.564 kg)  Wt Readings from Last 3 Encounters:  08/30/13 268 lb (121.564 kg)  07/12/13 269 lb (122.018 kg)  04/18/13 270 lb (122.471 kg)     General appearence: alert, no distress, WD/WN,  Oral cavity: MMM, no lesions Neck: supple, no lymphadenopathy, mild generalized enlarged thyroid/goiter, no specific nodule, no masses Heart: RRR, normal S1, S2, no murmurs Lungs: CTA bilaterally, no  wheezes, rhonchi, or rales Abdomen: +bs, soft, non tender, non distended, no masses Pulses: 2+ symmetric, upper and lower extremities, normal cap refill    Assessment  Encounter Diagnoses  Name Primary?  Marland Kitchen Unspecified hypothyroidism Yes  . Goiter   . Obesity, unspecified   . Anemia     Plan hypothyroidism - Routine labs today.  Compliant with medication.    Anemia - likely due to fibroids.  Recheck labs today.  C/t efforts at weight loss through diet and exercise.    consider other medications, Belviq, Contrave.   Patient was seen in conjunction with PA student Jaynee Eagles, and I have also evaluated and examined patient, agree with student's notes, student supervised by me.

## 2013-08-30 NOTE — Telephone Encounter (Signed)
Refer to nutritionist for obesity

## 2013-08-30 NOTE — Telephone Encounter (Signed)
I placed the order for nutrition  in [facility] and they will call her. CLS

## 2013-08-31 ENCOUNTER — Other Ambulatory Visit: Payer: Self-pay | Admitting: Medical

## 2013-08-31 LAB — T4, FREE: Free T4: 1.28 ng/dL (ref 0.80–1.80)

## 2013-08-31 LAB — TSH: TSH: 1.98 u[IU]/mL (ref 0.350–4.500)

## 2013-08-31 MED ORDER — LEVOTHYROXINE SODIUM 100 MCG PO TABS: 100.0000 ug | ORAL_TABLET | Freq: Every day | ORAL | Status: DC

## 2013-10-09 ENCOUNTER — Encounter: Payer: Self-pay | Admitting: *Deleted

## 2013-10-09 ENCOUNTER — Encounter: Payer: BC Managed Care – PPO | Attending: Medical | Admitting: *Deleted

## 2013-10-09 VITALS — Ht 69.0 in | Wt 275.0 lb

## 2013-10-09 DIAGNOSIS — Z79899 Other long term (current) drug therapy: Secondary | ICD-10-CM | POA: Diagnosis not present

## 2013-10-09 DIAGNOSIS — D509 Iron deficiency anemia, unspecified: Secondary | ICD-10-CM | POA: Insufficient documentation

## 2013-10-09 DIAGNOSIS — E669 Obesity, unspecified: Secondary | ICD-10-CM | POA: Diagnosis present

## 2013-10-09 DIAGNOSIS — Z713 Dietary counseling and surveillance: Secondary | ICD-10-CM | POA: Insufficient documentation

## 2013-10-09 DIAGNOSIS — Z6841 Body Mass Index (BMI) 40.0 and over, adult: Secondary | ICD-10-CM | POA: Insufficient documentation

## 2013-10-09 DIAGNOSIS — Z833 Family history of diabetes mellitus: Secondary | ICD-10-CM | POA: Diagnosis not present

## 2013-10-09 DIAGNOSIS — E079 Disorder of thyroid, unspecified: Secondary | ICD-10-CM | POA: Diagnosis not present

## 2013-10-09 NOTE — Patient Instructions (Addendum)
Go to grocery store: Red meat. Pork. Poultry. Seafood. Beans. Dark green leafy vegetables, such as spinach. Dried fruit, such as raisins and apricots. Iron-fortified cereals, breads and pastas.  Aim for 3 meals a day. Avoid meal skipping Whenever possible, eat at a table without distractions.  Limit tv, books, magazine, phone, driving, etc  Follow MyPlate recommendations for meal planning  Aim for physical activity 5 day/week for 20 minutes

## 2013-10-09 NOTE — Progress Notes (Signed)
Medical Nutrition Therapy:  Appt start time: 1600 end time:  1700.  Assessment:  Primary concerns today: Denise Schroeder is here for nutrition counseling pertaining to weight loss and learning how to eat more healthy.  She also has iron-deficiency anemia and is taking an iron supplement for that.  She has thyroid disease that is being treated with synthroid.   She states that her weight has fluctuated due to her thyroid disease that was diagnosed in 2008.  Her lowest weight as an adult is 265 lb and her lowest is 273 lb.  She has attempted to lose weight in the past via smoothies (spinach and organic fruit), diet pills, walking.  She states walking was healthful.  She would like to weigh 200 lb.  She is the youngest of 10 kids.  She is the heaviest of all.  Mom was diagnosed with diabetes year ago and she lost about 100 pounds.  Mom used to be about the same size as Dorathea, but now she is must slimmer. She lives alone.  She does her own shopping, but doesn't cook much.  Many times she will eat at her mom's house or she will make a sandwich and chips for dinner.  Sometimes she will get fast food and tries to chose the chicken sandwich.  She started taking some kind of detox pill b.i.d.    Preferred Learning Style:   Visual   Learning Readiness:   Ready   MEDICATIONS: see list   DIETARY INTAKE:  Usual eating pattern includes 2 meals and 1-3 snacks per day.  Everyday foods include lean proteins, starches.  Avoided foods include many fruits.    24-hr recall:  B ( AM): skips sometimes.  If she eats she might have boiled egg or egg salad or cheese toast or waffles.  Sometimes will buy [place] sausage.  Will eat bacon if she eats out.  Might stop and get biscuit sandwich Snk ( AM): not usually  L ( PM): [place] sandwich with chips and cookie. Snk ( PM): sometimes Fiber One brownie or [place] devil's food cookie.  Says she can't eat fruit unless it's organic.  Sometimes snacks on peanut M&Ms, cookies,  chips, trail mix D ( PM): sandwich and chips.  Might go to mom's: beef tips, greens, brown rice Snk ( PM): sometimes peanut butter bars or Little Debbie  Beverages: water, tea if she goes out, once a month has mountain dew, likes lemonade and tea  Usual physical activity: works at daycare, but none outside of ADLs  Estimated energy needs: 1800 calories 200 g carbohydrates 135 g protein 50 g fat    Nutritional Diagnosis:  NB-1.1 Food and nutrition-related knowledge deficit As related to proper balance of protein, starch, fruits and vegetables.  As evidenced by patient self-reportknowledge deficit.    Intervention:  Nutrition counseling provided. Educated Ramonita on iron-rich foods.  Discussed metabolic effects of meal skipping and encouraged 3 meals/day.  Discussed MyPlate recommendations for meal planning: small portion starch, lean protein, and more vegetables.  Discussed mindful eating: eating without distraction.  Also recommended regular physical activity  Goals:  Red meat. Pork. Poultry. Seafood. Beans. Dark green leafy vegetables, such as spinach. Dried fruit, such as raisins and apricots. Iron-fortified cereals, breads and pastas.  Aim for 3 meals a day. Avoid meal skipping  Whenever possible, eat at a table without distractions.  Limit tv, books, magazine, phone, driving, etc  Follow MyPlate recommendations for meal planning  Aim for physical activity 5 day/week  for 20 minutes   Teaching Method Utilized:  Visual Auditory   Handouts given during visit include:  MyPlate  Barriers to learning/adherence to lifestyle change: busy lifestyle and not being accustomed to cooking at home  Demonstrated degree of understanding via:  Teach Back   Monitoring/Evaluation:  Dietary intake, exercise, and body weight in 6-8 week(s).

## 2013-11-28 ENCOUNTER — Ambulatory Visit: Payer: BC Managed Care – PPO | Admitting: *Deleted

## 2013-12-12 ENCOUNTER — Encounter: Payer: BC Managed Care – PPO | Attending: Medical | Admitting: Dietician

## 2013-12-12 VITALS — Ht 69.0 in | Wt 273.5 lb

## 2013-12-12 DIAGNOSIS — Z713 Dietary counseling and surveillance: Secondary | ICD-10-CM | POA: Insufficient documentation

## 2013-12-12 DIAGNOSIS — Z833 Family history of diabetes mellitus: Secondary | ICD-10-CM | POA: Insufficient documentation

## 2013-12-12 DIAGNOSIS — Z79899 Other long term (current) drug therapy: Secondary | ICD-10-CM | POA: Diagnosis not present

## 2013-12-12 DIAGNOSIS — E079 Disorder of thyroid, unspecified: Secondary | ICD-10-CM | POA: Insufficient documentation

## 2013-12-12 DIAGNOSIS — E669 Obesity, unspecified: Secondary | ICD-10-CM | POA: Diagnosis present

## 2013-12-12 DIAGNOSIS — D509 Iron deficiency anemia, unspecified: Secondary | ICD-10-CM | POA: Insufficient documentation

## 2013-12-12 DIAGNOSIS — Z6841 Body Mass Index (BMI) 40.0 and over, adult: Secondary | ICD-10-CM | POA: Diagnosis not present

## 2013-12-12 NOTE — Patient Instructions (Addendum)
Keep working on eating without distractions.  Continue working on going to the grocery store more often and cooking more often.  Start exercising with your trainer 2 x week for 45 minutes. Increase up to 5 x week as you build up your tolerance.  Add 1/2 cup plain yogurt to your smoothie and use water instead of juice. Have 1 cup or less of fruit per smoothie.  Try have cheese on toast instead of a croissant. Try a [facility] Protein for a breakfast or snack.   Have protein with carbohydrates for snacks and meals.

## 2013-12-12 NOTE — Progress Notes (Signed)
  Medical Nutrition Therapy:  Appt start time: 1730 end time:  1800.  Assessment:  Primary concerns today: Denise Schroeder is here for a follow up for weight loss and returns with a 2 lb weight loss. Started eating breakfast since she was here last time. Does always eat meat. 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 Lives by herself though trying to cook more than before.   Gets her iron checked every 6 months (next time will be December).    Wt Readings from Last 3 Encounters:  12/12/13 273 lb 8 oz (124.059 kg)  10/09/13 275 lb (124.739 kg)  08/30/13 268 lb (121.564 kg)   Ht Readings from Last 3 Encounters:  12/12/13 5\' 9"  (1.753 m)  10/09/13 5\' 9"  (1.753 m)  04/18/13 5\' 9"  (1.753 m)   Body mass index is 40.37 kg/(m^2). @BMIFA @ Normalized weight-for-age data available only for age 23 to 66 years. Normalized stature-for-age data available only for age 23 to 35 years.  Preferred Learning Style:   Visual   Learning Readiness:   Ready   MEDICATIONS: see list   DIETARY INTAKE:  Usual eating pattern includes 2 meals and 1-3 snacks per day.  Everyday foods include lean proteins, starches.  Avoided foods include many fruits.    24-hr recall:  B ( AM): spinach, berries, fruit, pomegranate juice smoothie or cheese croissant or fiber one bar or Nutrigrain bar with water Snk ( AM): not usually  L ( PM):salad (1/2 Caesar) with chicken and popcorn  Snk ( PM): sometimes Fiber One Bar/brownie or organic fruit D ( PM): macaroni and cheese, chicken, and cabbage. Might go to mom's: salmon cakes and baked beans Snk ( PM): popcorn Beverages: water, 1/2 glass tea if she goes out, once a month has mountain dew, likes lemonade and tea  Usual physical activity: works at daycare, but none outside of ADLs, will be started with a trainer next week  Estimated energy needs: 1800 calories 200 g carbohydrates 135 g protein 50 g fat    Nutritional  Diagnosis:  NB-1.1 Food and nutrition-related knowledge deficit As related to proper balance of protein, starch, fruits and vegetables.  As evidenced by patient self-reportknowledge deficit.    Intervention:  Nutrition counseling provided.   Plan: Keep working on eating without distractions.  Continue working on going to the grocery store more often and cooking more often.  Start exercising with your trainer 2 x week for 45 minutes. Increase up to 5 x week as you build up your tolerance.  Add 1/2 cup plain yogurt to your smoothie and use water instead of juice. Have 1 cup or less of fruit per smoothie.  Try have cheese on toast instead of a croissant. Try a [facility] Protein for a breakfast or snack.   Have protein with carbohydrates for snacks and meals.    Teaching Method Utilized:  Visual Auditory   Handouts given during visit include:  15 g CHO Snacks  Barriers to learning/adherence to lifestyle change: busy lifestyle and not being accustomed to cooking at home  Demonstrated degree of understanding via:  Teach Back   Monitoring/Evaluation:  Dietary intake, exercise, and body weight in 8 week(s).

## 2014-02-12 ENCOUNTER — Ambulatory Visit: Payer: BC Managed Care – PPO | Admitting: Dietician

## 2014-02-14 ENCOUNTER — Ambulatory Visit: Payer: BC Managed Care – PPO | Admitting: Dietician

## 2014-02-26 ENCOUNTER — Encounter (HOSPITAL_BASED_OUTPATIENT_CLINIC_OR_DEPARTMENT_OTHER): Payer: Self-pay

## 2014-02-26 ENCOUNTER — Emergency Department (HOSPITAL_BASED_OUTPATIENT_CLINIC_OR_DEPARTMENT_OTHER): Payer: BC Managed Care – PPO

## 2014-02-26 ENCOUNTER — Emergency Department (HOSPITAL_BASED_OUTPATIENT_CLINIC_OR_DEPARTMENT_OTHER)
Admission: EM | Admit: 2014-02-26 | Discharge: 2014-02-26 | Disposition: A | Discharge status: Home / Self Care | Payer: BC Managed Care – PPO | Attending: Emergency Medicine | Admitting: Emergency Medicine

## 2014-02-26 DIAGNOSIS — E669 Obesity, unspecified: Secondary | ICD-10-CM | POA: Insufficient documentation

## 2014-02-26 DIAGNOSIS — D649 Anemia, unspecified: Secondary | ICD-10-CM | POA: Insufficient documentation

## 2014-02-26 DIAGNOSIS — Z8742 Personal history of other diseases of the female genital tract: Secondary | ICD-10-CM | POA: Insufficient documentation

## 2014-02-26 DIAGNOSIS — Z79899 Other long term (current) drug therapy: Secondary | ICD-10-CM | POA: Insufficient documentation

## 2014-02-26 DIAGNOSIS — R079 Chest pain, unspecified: Secondary | ICD-10-CM

## 2014-02-26 DIAGNOSIS — Z8719 Personal history of other diseases of the digestive system: Secondary | ICD-10-CM | POA: Diagnosis not present

## 2014-02-26 DIAGNOSIS — M549 Dorsalgia, unspecified: Secondary | ICD-10-CM

## 2014-02-26 DIAGNOSIS — M546 Pain in thoracic spine: Secondary | ICD-10-CM | POA: Diagnosis not present

## 2014-02-26 DIAGNOSIS — Z91040 Latex allergy status: Secondary | ICD-10-CM | POA: Insufficient documentation

## 2014-02-26 LAB — CBC
HEMATOCRIT: 27.6 % — AB (ref 36.0–46.0)
HEMOGLOBIN: 8.6 g/dL — AB (ref 12.0–15.0)
MCH: 26.5 pg (ref 26.0–34.0)
MCHC: 31.2 g/dL (ref 30.0–36.0)
MCV: 84.9 fL (ref 78.0–100.0)
Platelets: 270 10*3/uL (ref 150–400)
RBC: 3.25 MIL/uL — ABNORMAL LOW (ref 3.87–5.11)
RDW: 15.2 % (ref 11.5–15.5)
WBC: 6.7 10*3/uL (ref 4.0–10.5)

## 2014-02-26 LAB — BASIC METABOLIC PANEL
Anion gap: 11 (ref 5–15)
BUN: 17 mg/dL (ref 6–23)
CO2: 24 meq/L (ref 19–32)
CREATININE: 1 mg/dL (ref 0.50–1.10)
Calcium: 8.9 mg/dL (ref 8.4–10.5)
Chloride: 105 mEq/L (ref 96–112)
GFR calc Af Amer: 76 mL/min — ABNORMAL LOW (ref >90)
GFR calc non Af Amer: 66 mL/min — ABNORMAL LOW (ref >90)
Glucose, Bld: 96 mg/dL (ref 70–99)
POTASSIUM: 3.9 meq/L (ref 3.7–5.3)
Sodium: 140 mEq/L (ref 137–147)

## 2014-02-26 LAB — TROPONIN I

## 2014-02-26 MED ORDER — HYDROCODONE-ACETAMINOPHEN 5-325 MG PO TABS: 1.0000 | ORAL_TABLET | Freq: Four times a day (QID) | ORAL | Status: DC | PRN

## 2014-02-26 MED ORDER — KETOROLAC TROMETHAMINE 15 MG/ML IJ SOLN
15.0000 mg | Freq: Once | INTRAMUSCULAR | Status: AC
  Administered 2014-02-26: 15 mg via INTRAVENOUS
  Filled 2014-02-26: qty 1

## 2014-02-26 NOTE — ED Notes (Signed)
Pt reports chest pain that started last week at work.  Pt reports pain into shoulder.

## 2014-02-26 NOTE — ED Provider Notes (Signed)
CSN: [id number]     Arrival date & time 02/26/14  2057 History   This chart was scribed for Wynetta Fines, MD by Chester Holstein, ED Scribe. This patient was seen in room MH05/MH05 and the patient's care was started at 11:24 PM.    Chief Complaint  Patient presents with  . Chest Pain    The history is provided by the patient. No language interpreter was used.   HPI Comments: Denise Schroeder is a 48 y.o. female who presents to the Emergency Department complaining of sharp left sided upper back pain with onset a week ago. Pt notes pain increases with movement. Pt states pain increased today, noting pain worsens with deep inspiration. She rates it an 8/10. Pt has taken ibuprofen with mild relief. Pt denies nausea, fever, cough, SOB, pain or swelling in legs.   Past Medical History  Diagnosis Date  . GERD (gastroesophageal reflux disease)   . Anemia   . Allergy   . Hashimoto thyroiditis   . Goiter   . Obesity   . Uterine fibroid    Past Surgical History  Procedure Laterality Date  . Cholecystectomy     Family History  Problem Relation Age of Onset  . Diabetes Mother   . Hypertension Mother   . Asthma Son   . Diabetes Maternal Aunt   . Diabetes Maternal Uncle   . Stroke Neg Hx   . Heart disease Neg Hx   . Hyperlipidemia Neg Hx    History  Substance Use Topics  . Smoking status: Never Smoker   . Smokeless tobacco: Never Used  . Alcohol Use: No   OB History    No data available     Review of Systems    Allergies  Citrus and Latex  Home Medications   Prior to Admission medications   Medication Sig Start Date End Date Taking? Authorizing Provider  CALCIUM PO Take 2 tablets by mouth daily.    Historical Provider, MD  ibuprofen (ADVIL,MOTRIN) 200 MG tablet Take 600-800 mg by mouth every 6 (six) hours as needed (for pain).    Historical Provider, MD  IRON PO Take 1 tablet by mouth daily.    Historical Provider, MD  levothyroxine (SYNTHROID, LEVOTHROID) 100 MCG  tablet Take 1 tablet (100 mcg total) by mouth daily before breakfast. 08/31/13   Carlena Hurl, PA-C  Multiple Vitamin (MULTIVITAMIN WITH MINERALS) TABS Take 1 tablet by mouth daily.    Historical Provider, MD   BP 127/56 mmHg  Pulse 64  Temp(Src) 98.9 F (37.2 C) (Oral)  Resp 20  Ht 5\' 9"  (1.753 m)  Wt 270 lb (122.471 kg)  BMI 39.85 kg/m2  SpO2 99%  LMP 02/22/2014   Physical Exam  Nursing note and vitals reviewed.   General: Well-developed, well-nourished female in no acute distress; appearance consistent with age of record HENT: normocephalic; atraumatic Eyes: pupils equal, round and reactive to light; extraocular muscles intact Neck: supple Heart: regular rate and rhythm; no murmur Lungs: clear to auscultation bilaterally Abdomen: soft; nondistended; nontender; no masses or hepatosplenomegaly; bowel sounds present Back: Tenderness of soft tissue in left upper back, inferomedial to the scapula, pain worse with movement  Extremities: No deformity; full range of motion; pulses normal Neurologic: Awake, alert and oriented; motor function intact in all extremities and symmetric; no facial droop Skin: Warm and dry  Psychiatric: Normal mood and affect    ED Course  Procedures (including critical care time) DIAGNOSTIC STUDIES: Oxygen Saturation is  99% on room air, normal by my interpretation.    COORDINATION OF CARE: 11:31 PM Discussed treatment plan with patient at beside, the patient agrees with the plan and has no further questions at this time.    EKG Interpretation   Date/Time:  Monday February 26 2014 21:13:10 EST Ventricular Rate:  59 PR Interval:  150 QRS Duration: 84 QT Interval:  422 QTC Calculation: 417 R Axis:   84 Text Interpretation:  Sinus bradycardia with sinus arrhythmia Nonspecific  T wave abnormality Abnormal ECG No significant change since last tracing  Confirmed by Winfred Leeds  MD, SAM [phone]) on 02/26/2014 9:20:01 PM      MDM   Nursing notes  and vitals signs, including pulse oximetry, reviewed.  Summary of this visit's results, reviewed by myself:  Labs:  Results for orders placed or performed during the hospital encounter of 02/26/14 (from the past 24 hour(s))  Troponin I (MHP)     Status: None   Collection Time: 02/26/14  9:45 PM  Result Value Ref Range   Troponin I <0.30 <0.30 ng/mL  Basic metabolic panel     Status: Abnormal   Collection Time: 02/26/14  9:45 PM  Result Value Ref Range   Sodium 140 137 - 147 mEq/L   Potassium 3.9 3.7 - 5.3 mEq/L   Chloride 105 96 - 112 mEq/L   CO2 24 19 - 32 mEq/L   Glucose, Bld 96 70 - 99 mg/dL   BUN 17 6 - 23 mg/dL   Creatinine, Ser 1.00 0.50 - 1.10 mg/dL   Calcium 8.9 8.4 - 10.5 mg/dL   GFR calc non Af Amer 66 (L) >90 mL/min   GFR calc Af Amer 76 (L) >90 mL/min   Anion gap 11 5 - 15  CBC     Status: Abnormal   Collection Time: 02/26/14  9:45 PM  Result Value Ref Range   WBC 6.7 4.0 - 10.5 K/uL   RBC 3.25 (L) 3.87 - 5.11 MIL/uL   Hemoglobin 8.6 (L) 12.0 - 15.0 g/dL   HCT 27.6 (L) 36.0 - 46.0 %   MCV 84.9 78.0 - 100.0 fL   MCH 26.5 26.0 - 34.0 pg   MCHC 31.2 30.0 - 36.0 g/dL   RDW 15.2 11.5 - 15.5 %   Platelets 270 150 - 400 K/uL    Imaging Studies: Dg Chest 2 View  02/26/2014   CLINICAL DATA:  Upper left back pain. Numbness down the left arm. Pleuritic chest pain.  EXAM: CHEST  2 VIEW  COMPARISON:  04/18/2013  FINDINGS: Airway thickening is present, suggesting bronchitis or reactive airways disease. Cardiothoracic index 52%. Indistinct pulmonary vasculature. No pleural effusion. No overt airspace opacity.  IMPRESSION: 1. Airway thickening is present, suggesting bronchitis or reactive airways disease. 2. Mild enlargement of the cardiopericardial silhouette , with a borderline appearance for pulmonary venous hypertension.   Electronically Signed   By: Sherryl Barters M.D.   On: 02/26/2014 21:49    I personally performed the services described in this documentation, which  was scribed in my presence. The recorded information has been reviewed and is accurate.   Wynetta Fines, MD 02/26/14 [id number]

## 2014-02-26 NOTE — ED Notes (Signed)
MD at bedside. 

## 2014-03-12 ENCOUNTER — Telehealth: Payer: Self-pay | Admitting: Family Medicine

## 2014-03-12 NOTE — Telephone Encounter (Signed)
ER letter sent 

## 2014-03-13 ENCOUNTER — Ambulatory Visit (INDEPENDENT_AMBULATORY_CARE_PROVIDER_SITE_OTHER): Payer: BC Managed Care – PPO | Admitting: Medical

## 2014-03-13 ENCOUNTER — Encounter: Payer: Self-pay | Admitting: Medical

## 2014-03-13 ENCOUNTER — Encounter: Payer: Self-pay | Admitting: Gastroenterology

## 2014-03-13 ENCOUNTER — Telehealth: Payer: Self-pay | Admitting: Medical

## 2014-03-13 VITALS — BP 112/80 | HR 78 | Temp 97.8°F | Wt 271.0 lb

## 2014-03-13 DIAGNOSIS — Z8742 Personal history of other diseases of the female genital tract: Secondary | ICD-10-CM

## 2014-03-13 DIAGNOSIS — T7840XA Allergy, unspecified, initial encounter: Secondary | ICD-10-CM

## 2014-03-13 DIAGNOSIS — R062 Wheezing: Secondary | ICD-10-CM

## 2014-03-13 DIAGNOSIS — D500 Iron deficiency anemia secondary to blood loss (chronic): Secondary | ICD-10-CM

## 2014-03-13 DIAGNOSIS — Z86018 Personal history of other benign neoplasm: Secondary | ICD-10-CM

## 2014-03-13 DIAGNOSIS — R221 Localized swelling, mass and lump, neck: Secondary | ICD-10-CM

## 2014-03-13 MED ORDER — EPINEPHRINE 0.3 MG/0.3ML IJ SOAJ: 0.3000 mg | Freq: Once | INTRAMUSCULAR | Status: DC

## 2014-03-13 NOTE — Progress Notes (Signed)
Subjective: Here for allergic reaction on Christmas day.   That day she drank some tea somebody made that had a lot of lemon juice and she did know about it before hand.  Shortly thereafter cough, throat swelling, itching in mouth, hard to swallow and breath.  Took a benadryl, symptoms subsided a bit.  After a while things gradually improved.  Continues to have throat irritation the next few days.   Didn't go to the ED . Took benadryl the next several days.  Wanted to come in for eval.   No other aggravating or relieving factors.   Hx/o anemia.  Taking iron.  Still seeing gyn for fibroids . Never saw GI after visit here in June.  No other complaint.  ROS as in subjective  Objective: Filed Vitals:   03/13/14 1338  BP: 112/80  Pulse: 78  Temp: 97.8 F (36.6 C)    General appearance: alert, no distress, WD/WN HEENT: normocephalic, sclerae anicteric, TMs pearly, nares patent, no discharge or erythema, pharynx normal Oral cavity: MMM, no lesions Neck: supple, no lymphadenopathy, no thyromegaly, no masses Heart: RRR, normal S1, S2, no murmurs Lungs: CTA bilaterally, no wheezes, rhonchi, or rales Abdomen: +bs, soft, non tender, non distended, no masses, no hepatomegaly, no splenomegaly Pulses: 2+ symmetric, upper and lower extremities, normal cap refill Ext: no edema   Assessment: Encounter Diagnoses  Name Primary?  . Allergic reaction, initial encounter Yes  . Wheezing   . Throat swelling   . Anemia due to chronic blood loss   . History of uterine fibroid     Plan: Allergic reaction, wheezing, throat swelling - advised the next time this happens, take Benadryl OTC 50mg , Epipen and call 911.  Script given for epipen.  Referral to allergist.   Anemia - referral to GI to rule out GI source. C/t iron, f/u with gyn about fibroids.

## 2014-03-13 NOTE — Telephone Encounter (Signed)
Refer to allergist and GI

## 2014-03-13 NOTE — Telephone Encounter (Signed)
Patient has an appointment with [facility] [address], [place] 04/27/2014 2 330 pm with Dr. Deatra Ina

## 2014-03-13 NOTE — Telephone Encounter (Signed)
I fax over her referral to [facility] # [phone] and they will contact the patient for her appointment. CLS

## 2014-03-14 NOTE — Telephone Encounter (Signed)
Pt was notified about GI appt in February. Pt also told me she has an appt on January 12th at the [facility]

## 2014-03-16 HISTORY — PX: UTERINE FIBROID EMBOLIZATION: SHX825

## 2014-03-30 ENCOUNTER — Other Ambulatory Visit: Payer: Self-pay | Admitting: Obstetrics and Gynecology

## 2014-04-02 LAB — CYTOLOGY - PAP

## 2014-04-16 HISTORY — PX: COLONOSCOPY: SHX174

## 2014-04-27 ENCOUNTER — Encounter: Payer: Self-pay | Admitting: Gastroenterology

## 2014-04-27 ENCOUNTER — Ambulatory Visit (INDEPENDENT_AMBULATORY_CARE_PROVIDER_SITE_OTHER): Payer: BLUE CROSS/BLUE SHIELD | Admitting: Gastroenterology

## 2014-04-27 VITALS — BP 104/58 | HR 84 | Ht 67.75 in | Wt 275.2 lb

## 2014-04-27 DIAGNOSIS — D649 Anemia, unspecified: Secondary | ICD-10-CM | POA: Insufficient documentation

## 2014-04-27 DIAGNOSIS — D500 Iron deficiency anemia secondary to blood loss (chronic): Secondary | ICD-10-CM

## 2014-04-27 DIAGNOSIS — D508 Other iron deficiency anemias: Secondary | ICD-10-CM

## 2014-04-27 NOTE — Patient Instructions (Addendum)
Go to the basement today for your lab kit (IFOB)  You have been scheduled for a colonoscopy. Please follow written instructions given to you at your visit today.  Please pick up your prep kit at the pharmacy within the next 1-3 days. If you use inhalers (even only as needed), please bring them with you on the day of your procedure. Your physician has requested that you go to www.startemmi.com and enter the access code given to you at your visit today. This web site gives a general overview about your procedure. However, you should still follow specific instructions given to you by our office regarding your preparation for the procedure.  Hold IRON 4 days before procedure Suprep sample given

## 2014-04-27 NOTE — Progress Notes (Signed)
_                                                                                                                History of Present Illness:  Denise Schroeder is a pleasant 49 year old Afro-American female referred for evaluation of anemia.  Apparently has been a long-standing problem.  In December, 2015 hemoglobin was 8.6.  She has been on chronic iron therapy.  She has no GI complaints including change of bowel habits, abdominal pain or rectal bleeding.  She separates from menorrhagia.   Past Medical History  Diagnosis Date  . GERD (gastroesophageal reflux disease)   . Anemia   . Allergy   . Hashimoto thyroiditis   . Goiter   . Obesity   . Uterine fibroid    Past Surgical History  Procedure Laterality Date  . Cholecystectomy     family history includes Asthma in her son; Diabetes in her maternal aunt, maternal uncle, and mother; Hypertension in her mother. There is no history of Stroke, Heart disease, Hyperlipidemia, Colon cancer, Colon polyps, Kidney disease, Esophageal cancer, or Gallbladder disease. Current Outpatient Prescriptions  Medication Sig Dispense Refill  . CALCIUM PO Take 2 tablets by mouth daily.    Marland Kitchen EPINEPHrine (EPIPEN 2-PAK) 0.3 mg/0.3 mL IJ SOAJ injection Inject 0.3 mLs (0.3 mg total) into the muscle once. 1 Device 0  . HYDROcodone-acetaminophen (NORCO/VICODIN) 5-325 MG per tablet Take 1-2 tablets by mouth every 6 (six) hours as needed (for pain). 20 tablet 0  . ibuprofen (ADVIL,MOTRIN) 200 MG tablet Take 600-800 mg by mouth every 6 (six) hours as needed (for pain).    . IRON PO Take 1 tablet by mouth daily.    Marland Kitchen levothyroxine (SYNTHROID, LEVOTHROID) 100 MCG tablet Take 1 tablet (100 mcg total) by mouth daily before breakfast. 30 tablet 11  . Multiple Vitamin (MULTIVITAMIN WITH MINERALS) TABS Take 1 tablet by mouth daily.     No current facility-administered medications for this visit.   Allergies as of 04/27/2014 - Review Complete 04/27/2014    Allergen Reaction Noted  . Citrus Itching, Swelling, and Cough 12/19/2010  . Latex Hives 12/19/2010    reports that she has never smoked. She has never used smokeless tobacco. She reports that she drinks alcohol. She reports that she does not use illicit drugs.   Review of Systems: Pertinent positive and negative review of systems were noted in the above HPI section. All other review of systems were otherwise negative.  Vital signs were reviewed in today's medical record Physical Exam: General: Well developed , well nourished, no acute distress Skin: anicteric Head: Normocephalic and atraumatic Eyes:  sclerae anicteric, EOMI Ears: Normal auditory acuity Mouth: No deformity or lesions Neck: Supple, no masses or thyromegaly Lungs: Clear throughout to auscultation Heart: Regular rate and rhythm; no murmurs, rubs or bruits Abdomen: Soft, non tender and non distended. No masses, hepatosplenomegaly or hernias noted. Normal Bowel sounds Rectal:deferred Musculoskeletal: Symmetrical with no gross deformities  Skin: No lesions on visible extremities Pulses:  Normal pulses noted Extremities: No clubbing, cyanosis, edema or deformities noted Neurological: Alert oriented x 4, grossly nonfocal Cervical Nodes:  No significant cervical adenopathy Inguinal Nodes: No significant inguinal adenopathy Psychological:  Alert and cooperative. Normal mood and affect  See Assessment and Plan under Problem List

## 2014-04-27 NOTE — Assessment & Plan Note (Signed)
Anemia is probably related to menorrhagia.  There is no clinical evidence to suggest GI bleeding although this should be ruled out.  Recommendations #1 stool Hemoccults #2 colonoscopy

## 2014-05-02 ENCOUNTER — Ambulatory Visit (AMBULATORY_SURGERY_CENTER): Payer: BLUE CROSS/BLUE SHIELD | Admitting: Gastroenterology

## 2014-05-02 ENCOUNTER — Encounter: Payer: Self-pay | Admitting: Gastroenterology

## 2014-05-02 VITALS — BP 126/68 | HR 60 | Temp 98.0°F | Resp 14 | Ht 67.75 in | Wt 275.0 lb

## 2014-05-02 DIAGNOSIS — D508 Other iron deficiency anemias: Secondary | ICD-10-CM

## 2014-05-02 MED ORDER — SODIUM CHLORIDE 0.9 % IV SOLN: 500.0000 mL | INTRAVENOUS | Status: DC

## 2014-05-02 NOTE — Patient Instructions (Signed)
YOU HAD AN ENDOSCOPIC PROCEDURE TODAY AT THE [facility]: Refer to the procedure report that was given to you for any specific questions about what was found during the examination.  If the procedure report does not answer your questions, please call your gastroenterologist to clarify.  If you requested that your care partner not be given the details of your procedure findings, then the procedure report has been included in a sealed envelope for you to review at your convenience later.  YOU SHOULD EXPECT: Some feelings of bloating in the abdomen. Passage of more gas than usual.  Walking can help get rid of the air that was put into your GI tract during the procedure and reduce the bloating. If you had a lower endoscopy (such as a colonoscopy or flexible sigmoidoscopy) you may notice spotting of blood in your stool or on the toilet paper. If you underwent a bowel prep for your procedure, then you may not have a normal bowel movement for a few days.  DIET: Your first meal following the procedure should be a light meal and then it is ok to progress to your normal diet.  A half-sandwich or bowl of soup is an example of a good first meal.  Heavy or fried foods are harder to digest and may make you feel nauseous or bloated.  Likewise meals heavy in dairy and vegetables can cause extra gas to form and this can also increase the bloating.  Drink plenty of fluids but you should avoid alcoholic beverages for 24 hours.  ACTIVITY: Your care partner should take you home directly after the procedure.  You should plan to take it easy, moving slowly for the rest of the day.  You can resume normal activity the day after the procedure however you should NOT DRIVE or use heavy machinery for 24 hours (because of the sedation medicines used during the test).    SYMPTOMS TO REPORT IMMEDIATELY: A gastroenterologist can be reached at any hour.  During normal business hours, 8:30 AM to 5:00 PM Monday through Friday,  call [phone].  After hours and on weekends, please call the GI answering service at [phone] who will take a message and have the physician on call contact you.   Following lower endoscopy (colonoscopy or flexible sigmoidoscopy):  Excessive amounts of blood in the stool  Significant tenderness or worsening of abdominal pains  Swelling of the abdomen that is new, acute  Fever of 100F or higher  FOLLOW UP: If any biopsies were taken you will be contacted by phone or by letter within the next 1-3 weeks.  Call your gastroenterologist if you have not heard about the biopsies in 3 weeks.  Our staff will call the home number listed on your records the next business day following your procedure to check on you and address any questions or concerns that you may have at that time regarding the information given to you following your procedure. This is a courtesy call and so if there is no answer at the home number and we have not heard from you through the emergency physician on call, we will assume that you have returned to your regular daily activities without incident.  SIGNATURES/CONFIDENTIALITY: You and/or your care partner have signed paperwork which will be entered into your electronic medical record.  These signatures attest to the fact that that the information above on your After Visit Summary has been reviewed and is understood.  Full responsibility of the confidentiality of this   discharge information lies with you and/or your care-partner.  Resume medications. 

## 2014-05-02 NOTE — Op Note (Signed)
[name]  [address] [place], [id number]   COLONOSCOPY PROCEDURE REPORT  PATIENT: Denise Schroeder, Denise Schroeder  MR#: [id number] BIRTHDATE: 1965-05-20 , 49  yrs. old GENDER: female ENDOSCOPIST: Inda Castle, MD REFERRED DE:YCXK [employer], M.D. PROCEDURE DATE:  05/02/2014 PROCEDURE:   Colonoscopy, screening First Screening Colonoscopy - Avg.  risk and is 50 yrs.  old or older Yes.  Prior Negative Screening - Now for repeat screening. N/A  History of Adenoma - Now for follow-up colonoscopy & has been > or = to 3 yrs.  N/A  Polyps Removed Today? No.  Recommend repeat exam, <10 yrs? No. ASA CLASS:   Class II INDICATIONS:iron deficiency anemia. MEDICATIONS: Monitored anesthesia care and Propofol 200 mg IV  DESCRIPTION OF PROCEDURE:   After the risks benefits and alternatives of the procedure were thoroughly explained, informed consent was obtained.  The digital rectal exam revealed no abnormalities of the rectum.   The [id number]  endoscope was introduced through the anus and advanced to the cecum, which was identified by both the appendix and ileocecal valve. No adverse events experienced.   The quality of the prep was good, using MoviPrep excellent using Suprep  The instrument was then slowly withdrawn as the colon was fully examined.      COLON FINDINGS: A normal appearing cecum, ileocecal valve, and appendiceal orifice were identified.  the ascending, transverse, descending, sigmoid colon, and rectum appeared unremarkable. Retroflexed views revealed no abnormalities. The time to cecum=2 minutes 58 seconds.  Withdrawal time=6 minutes 56 seconds.  The scope was withdrawn and the procedure completed. COMPLICATIONS: There were no complications.  ENDOSCOPIC IMPRESSION: Normal colonoscopy  iron deficiency anemia secondary to menorrhagia  RECOMMENDATIONS: Colonoscopy 10 years  eSigned:  Inda Castle, MD 05/02/2014 12:08 PM   cc:

## 2014-05-02 NOTE — Progress Notes (Signed)
Report to PACU, RN, vss, BBS= Clear.  

## 2014-05-03 ENCOUNTER — Telehealth: Payer: Self-pay | Admitting: *Deleted

## 2014-05-03 NOTE — Telephone Encounter (Signed)
  Follow up Call-  Call back number 05/02/2014  Post procedure Call Back phone  # [phone]  Permission to leave phone message Yes     Patient questions:  Do you have a fever, pain , or abdominal swelling? No. Pain Score  0 *  Have you tolerated food without any problems? Yes.    Have you been able to return to your normal activities? Yes.    Do you have any questions about your discharge instructions: Diet   No. Medications  No. Follow up visit  No.  Do you have questions or concerns about your Care? No.  Actions: * If pain score is 4 or above: No action needed, pain <4.  Pt having cramping in lower back, advised to drink warm liquids and move around a little to see if she could get more air out, advised pt to call back if pain increases-adm

## 2014-05-24 ENCOUNTER — Encounter: Payer: Self-pay | Admitting: Medical

## 2014-05-24 ENCOUNTER — Ambulatory Visit (INDEPENDENT_AMBULATORY_CARE_PROVIDER_SITE_OTHER): Payer: BLUE CROSS/BLUE SHIELD | Admitting: Medical

## 2014-05-24 VITALS — BP 120/80 | HR 78 | Temp 98.4°F | Resp 16 | Wt 270.0 lb

## 2014-05-24 DIAGNOSIS — D509 Iron deficiency anemia, unspecified: Secondary | ICD-10-CM

## 2014-05-24 DIAGNOSIS — R195 Other fecal abnormalities: Secondary | ICD-10-CM

## 2014-05-24 DIAGNOSIS — R1012 Left upper quadrant pain: Secondary | ICD-10-CM

## 2014-05-24 DIAGNOSIS — R1110 Vomiting, unspecified: Secondary | ICD-10-CM

## 2014-05-24 LAB — CBC WITH DIFFERENTIAL/PLATELET
BASOS ABS: 0 10*3/uL (ref 0.0–0.1)
Basophils Relative: 0 % (ref 0–1)
EOS ABS: 0.2 10*3/uL (ref 0.0–0.7)
EOS PCT: 3 % (ref 0–5)
HCT: 29.7 % — ABNORMAL LOW (ref 36.0–46.0)
HEMOGLOBIN: 9.7 g/dL — AB (ref 12.0–15.0)
LYMPHS PCT: 35 % (ref 12–46)
Lymphs Abs: 2 10*3/uL (ref 0.7–4.0)
MCH: 27 pg (ref 26.0–34.0)
MCHC: 32.7 g/dL (ref 30.0–36.0)
MCV: 82.7 fL (ref 78.0–100.0)
MONOS PCT: 7 % (ref 3–12)
MPV: 9.2 fL (ref 8.6–12.4)
Monocytes Absolute: 0.4 10*3/uL (ref 0.1–1.0)
Neutro Abs: 3.1 10*3/uL (ref 1.7–7.7)
Neutrophils Relative %: 55 % (ref 43–77)
Platelets: 232 10*3/uL (ref 150–400)
RBC: 3.59 MIL/uL — AB (ref 3.87–5.11)
RDW: 16.8 % — ABNORMAL HIGH (ref 11.5–15.5)
WBC: 5.7 10*3/uL (ref 4.0–10.5)

## 2014-05-24 LAB — POCT URINALYSIS DIPSTICK
Bilirubin, UA: NEGATIVE
Blood, UA: NEGATIVE
GLUCOSE UA: NEGATIVE
Ketones, UA: NEGATIVE
Leukocytes, UA: NEGATIVE
Nitrite, UA: NEGATIVE
Protein, UA: NEGATIVE
Spec Grav, UA: >=1.03
UROBILINOGEN UA: NEGATIVE
pH, UA: 5.5

## 2014-05-24 LAB — COMPREHENSIVE METABOLIC PANEL
ALBUMIN: 3.8 g/dL (ref 3.5–5.2)
ALK PHOS: 50 U/L (ref 39–117)
ALT: 11 U/L (ref 0–35)
AST: 13 U/L (ref 0–37)
BILIRUBIN TOTAL: 0.3 mg/dL (ref 0.2–1.2)
BUN: 15 mg/dL (ref 6–23)
CO2: 19 mEq/L (ref 19–32)
Calcium: 8.4 mg/dL (ref 8.4–10.5)
Chloride: 107 mEq/L (ref 96–112)
Creat: 0.81 mg/dL (ref 0.50–1.10)
Glucose, Bld: 103 mg/dL — ABNORMAL HIGH (ref 70–99)
Potassium: 3.7 mEq/L (ref 3.5–5.3)
Sodium: 136 mEq/L (ref 135–145)
Total Protein: 6.6 g/dL (ref 6.0–8.3)

## 2014-05-24 LAB — LIPASE: LIPASE: 29 U/L (ref 0–75)

## 2014-05-24 MED ORDER — HYDROCODONE-ACETAMINOPHEN 5-325 MG PO TABS: 1.0000 | ORAL_TABLET | Freq: Four times a day (QID) | ORAL | Status: DC | PRN

## 2014-05-24 MED ORDER — PROMETHAZINE HCL 25 MG PO TABS: 25.0000 mg | ORAL_TABLET | Freq: Three times a day (TID) | ORAL | Status: DC | PRN

## 2014-05-24 NOTE — Patient Instructions (Signed)
Recommendations:  For now, no solid food  Only drink clear liquids in small portions, but often  Use Hydrocodone for pain as needed every 4-6 hours  Begin promethazine 1 tablet every 6 hours as needed for nausea  We will call with labs   Abdominal Pain Many things can cause abdominal pain. Usually, abdominal pain is not caused by a disease and will improve without treatment. It can often be observed and treated at home. Your health care provider will do a physical exam and possibly order blood tests and X-rays to help determine the seriousness of your pain. However, in many cases, more time must pass before a clear cause of the pain can be found. Before that point, your health care provider may not know if you need more testing or further treatment. HOME CARE INSTRUCTIONS  Monitor your abdominal pain for any changes. The following actions may help to alleviate any discomfort you are experiencing:  Only take over-the-counter or prescription medicines as directed by your health care provider.  Do not take laxatives unless directed to do so by your health care provider.  Try a clear liquid diet (broth, tea, or water) as directed by your health care provider. Slowly move to a bland diet as tolerated. SEEK MEDICAL CARE IF:  You have unexplained abdominal pain.  You have abdominal pain associated with nausea or diarrhea.  You have pain when you urinate or have a bowel movement.  You experience abdominal pain that wakes you in the night.  You have abdominal pain that is worsened or improved by eating food.  You have abdominal pain that is worsened with eating fatty foods.  You have a fever. SEEK IMMEDIATE MEDICAL CARE IF:   Your pain does not go away within 2 hours.  You keep throwing up (vomiting).  Your pain is felt only in portions of the abdomen, such as the right side or the left lower portion of the abdomen.  You pass bloody or black tarry stools. MAKE SURE  YOU:  Understand these instructions.   Will watch your condition.   Will get help right away if you are not doing well or get worse.  Document Released: 12/10/2004 Document Revised: 03/07/2013 Document Reviewed: 11/09/2012 [facility] Patient Information 2015 [place], [place]. This information is not intended to replace advice given to you by your health care provider. Make sure you discuss any questions you have with your health care provider.    Acute Pancreatitis Acute pancreatitis is a disease in which the pancreas becomes suddenly inflamed. The pancreas is a large gland located behind your stomach. The pancreas produces enzymes that help digest food. The pancreas also releases the hormones glucagon and insulin that help regulate blood sugar. Damage to the pancreas occurs when the digestive enzymes from the pancreas are activated and begin attacking the pancreas before being released into the intestine. Most acute attacks last a couple of days and can cause serious complications. Some people become dehydrated and develop low blood pressure. In severe cases, bleeding into the pancreas can lead to shock and can be life-threatening. The lungs, heart, and kidneys may fail. CAUSES  Pancreatitis can happen to anyone. In some cases, the cause is unknown. Most cases are caused by:  Alcohol abuse.  Gallstones. Other less common causes are:  Certain medicines.  Exposure to certain chemicals.  Infection.  Damage caused by an accident (trauma).  Abdominal surgery. SYMPTOMS   Pain in the upper abdomen that may radiate to the back.  Tenderness  and swelling of the abdomen.  Nausea and vomiting. DIAGNOSIS  Your caregiver will perform a physical exam. Blood and stool tests may be done to confirm the diagnosis. Imaging tests may also be done, such as X-rays, CT scans, or an ultrasound of the abdomen. TREATMENT  Treatment usually requires a stay in the hospital. Treatment may  include:  Pain medicine.  Fluid replacement through an intravenous line (IV).  Placing a tube in the stomach to remove stomach contents and control vomiting.  Not eating for 3 or 4 days. This gives your pancreas a rest, because enzymes are not being produced that can cause further damage.  Antibiotic medicines if your condition is caused by an infection.  Surgery of the pancreas or gallbladder. HOME CARE INSTRUCTIONS   Follow the diet advised by your caregiver. This may involve avoiding alcohol and decreasing the amount of fat in your diet.  Eat smaller, more frequent meals. This reduces the amount of digestive juices the pancreas produces.  Drink enough fluids to keep your urine clear or pale yellow.  Only take over-the-counter or prescription medicines as directed by your caregiver.  Avoid drinking alcohol if it caused your condition.  Do not smoke.  Get plenty of rest.  Check your blood sugar at home as directed by your caregiver.  Keep all follow-up appointments as directed by your caregiver. SEEK MEDICAL CARE IF:   You do not recover as quickly as expected.  You develop new or worsening symptoms.  You have persistent pain, weakness, or nausea.  You recover and then have another episode of pain. SEEK IMMEDIATE MEDICAL CARE IF:   You are unable to eat or keep fluids down.  Your pain becomes severe.  You have a fever or persistent symptoms for more than 2 to 3 days.  You have a fever and your symptoms suddenly get worse.  Your skin or the white part of your eyes turn yellow (jaundice).  You develop vomiting.  You feel dizzy, or you faint.  Your blood sugar is high (over 300 mg/dL). MAKE SURE YOU:   Understand these instructions.  Will watch your condition.  Will get help right away if you are not doing well or get worse. Document Released: 03/02/2005 Document Revised: 09/01/2011 Document Reviewed: 06/11/2011 [facility] Patient Information 2015  [place], [place]. This information is not intended to replace advice given to you by your health care provider. Make sure you discuss any questions you have with your health care provider.

## 2014-05-24 NOTE — Progress Notes (Signed)
Subjective She comes in as a work in today for c/o severe abdominal pain.  Has hx/o anemia, uterine fibroids, hashimoto's thyroiditis, goiter, obesity, s/p cholecystectomy.  Had normal colonoscopy 04/2014 with Dr. Deatra Ina, anemia thought to be due to menorrhagia.  Hemoglobin in the 8 range few months ago with ED visit.  Just saw gyn last week, has procedure to cut blood supply to uterus to stop anemia/bleeding.     Her symptoms today begin this morning with intermittent bouts of severe pain LUQ.   Has had nausea and vomiting a few times, still nauseated.   Has had 5-6 loose stools today without blood or mucous.  No recent sick contacts.  No GU symptoms, no other vaginal symptoms.  Not recently drinking alcohol and not drinking regularly.   Has been trying to eat healthy, exercise.  No recent URI symptoms.   No back pain.  No fever, no chills, no body aches.  No other aggravating or relieving factors. No other complaint.   Past Medical History  Diagnosis Date  . GERD (gastroesophageal reflux disease)   . Anemia   . Allergy   . Hashimoto thyroiditis   . Goiter   . Obesity   . Uterine fibroid    Past Surgical History  Procedure Laterality Date  . Cholecystectomy     ROS as in subjective    Objective  BP 120/80 mmHg  Pulse 78  Temp(Src) 98.4 F (36.9 C) (Oral)  Resp 16  Wt 270 lb (122.471 kg)  LMP 04/29/2014 (Approximate)  General appearance: alert, no distress, WD/WN HEENT: normocephalic, sclerae anicteric, TMs pearly, nares patent, no discharge or erythema, pharynx normal Oral cavity: MMM, no lesions Neck: supple, no lymphadenopathy, no thyromegaly, no masses Heart: RRR, normal S1, S2, no murmurs Lungs: CTA bilaterally, no wheezes, rhonchi, or rales Abdomen: +bs, soft, epigastric and LUQ tenderness, otherwise non tender, non distended, no masses, no hepatomegaly, no splenomegaly Pulses: 2+ symmetric, upper and lower extremities, normal cap refill Ext: no  edema    Assessment: Encounter Diagnoses  Name Primary?  . Abdominal pain, left upper quadrant Yes  . Intractable vomiting with nausea, vomiting of unspecified type   . Loose stools   . Iron deficiency anemia     Plan: STAT labs, UA.   Discussed possible causes, gastroenteritis, pancreatitis, although can't completely rule out kidney stone or other.   Can use Hydrocodone for pain, promethazine for nausea, clear fluids po, no solid foods for now.  We will call with results, recommendations.   If much worse, go to the ED.

## 2014-05-28 ENCOUNTER — Other Ambulatory Visit: Payer: Self-pay | Admitting: Obstetrics and Gynecology

## 2014-05-28 DIAGNOSIS — D259 Leiomyoma of uterus, unspecified: Secondary | ICD-10-CM

## 2014-05-29 ENCOUNTER — Other Ambulatory Visit: Payer: BLUE CROSS/BLUE SHIELD

## 2014-05-31 ENCOUNTER — Ambulatory Visit
Admission: RE | Admit: 2014-05-31 | Discharge: 2014-05-31 | Disposition: A | Discharge status: Home / Self Care | Payer: BLUE CROSS/BLUE SHIELD | Source: Ambulatory Visit | Attending: Obstetrics and Gynecology | Admitting: Obstetrics and Gynecology

## 2014-05-31 ENCOUNTER — Other Ambulatory Visit: Payer: Self-pay | Admitting: Obstetrics and Gynecology

## 2014-05-31 DIAGNOSIS — D259 Leiomyoma of uterus, unspecified: Secondary | ICD-10-CM | POA: Insufficient documentation

## 2014-05-31 HISTORY — PX: IR GENERIC HISTORICAL: IMG1180011

## 2014-05-31 NOTE — Consult Note (Signed)
Chief Complaint: Chief Complaint  Patient presents with  . [facility] Consult    Consult for [place]    Referring Physician(s): Cousins,Sheronette  History of Present Illness: Denise Schroeder is a 49 y.o. female who presents for further evaluation of symptomatic uterine fibroids.  Denise Schroeder is a very pleasant 49 year old female who has had a diagnosis of uterine fibroids for approximately 7 years. However, over the last 3 years she has developed progressive significance menorrhagia and dysmenorrhea. Her menorrhagia is competent by persistent anemia. Her last hemoglobin was 8.5. She was recently started on oral iron supplementation.   Denise Schroeder is a G1 P68 African-American female. Her periods occur regularly and last for 7-10 days. The first 3 days are very heavy with passage of large clots. She has to wear super maxi pads and changes them every 2 hours throughout the day. At night during her heavy flow days that she often has to sleep with dependence and put additional padding on the bed so that she doesn't wake up in a pool of blood. She also suffers with dysmenorrhea. She feels excessive pressure and heaviness in her pelvis and vaginal area during her cycles. She also notes symptoms of bloating and back pain. Her symptoms are so severe she often has to take off work for several days every month during the worst portion of her cycle.  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 Her uterine fibroid symptom and health-related quality of life questionnaire was impressive. Her uterine fibroid symptom severity score is 96.9 out of a maximum of 100 (higher scores indicating more severe symptoms) and her health-related quality of life score was 3.5 out of 100 (lower scores indicating decreased quality of life). This indicates that not only does she rate her symptoms as very severe but they are definitely impacting her quality of life.  She  has discussed possible treatment options with her gynecologist but has not tried any specific therapies. She has had both a Pap smear and endometrial biopsy performed earlier this year which were within normal limits. Sonographic evaluation on 04/20/2013 demonstrates at least 5 uterine fibroids which have enlarged since her prior ultrasound. The fibroids range in size from 2.5 cm to a maximum of 5.7 cm.   Past Medical History  Diagnosis Date  . GERD (gastroesophageal reflux disease)   . Anemia   . Allergy   . Hashimoto thyroiditis   . Goiter   . Obesity   . Uterine fibroid     Past Surgical History  Procedure Laterality Date  . Cholecystectomy      Allergies: Citrus and Latex  Medications: Prior to Admission medications   Medication Sig Start Date End Date Taking? Authorizing Provider  CALCIUM PO Take 2 tablets by mouth daily.   Yes Historical Provider, MD  EPINEPHrine (EPIPEN 2-PAK) 0.3 mg/0.3 mL IJ SOAJ injection Inject 0.3 mLs (0.3 mg total) into the muscle once. 03/13/14  Yes Camelia Eng Tysinger, PA-C  ibuprofen (ADVIL,MOTRIN) 200 MG tablet Take 600-800 mg by mouth every 6 (six) hours as needed (for pain).   Yes Historical Provider, MD  IRON PO Take 1 tablet by mouth daily.   Yes Historical Provider, MD  levothyroxine (SYNTHROID, LEVOTHROID) 100 MCG tablet Take 1 tablet (100 mcg total) by mouth daily before breakfast. 08/31/13  Yes Camelia Eng Tysinger, PA-C  Multiple Vitamin (MULTIVITAMIN WITH MINERALS) TABS Take 1 tablet by mouth daily.  Yes Historical Provider, MD  HYDROcodone-acetaminophen (NORCO/VICODIN) 5-325 MG per tablet Take 1-2 tablets by mouth every 6 (six) hours as needed (for pain). Patient not taking: Reported on 05/31/2014 05/24/14   Camelia Eng Tysinger, PA-C  promethazine (PHENERGAN) 25 MG tablet Take 1 tablet (25 mg total) by mouth every 8 (eight) hours as needed for nausea or vomiting. Patient not taking: Reported on 05/31/2014 05/24/14   Carlena Hurl, PA-C     Family  History  Problem Relation Age of Onset  . Diabetes Mother   . Hypertension Mother   . Asthma Son   . Diabetes Maternal Aunt   . Diabetes Maternal Uncle   . Stroke Neg Hx   . Heart disease Neg Hx   . Hyperlipidemia Neg Hx   . Colon cancer Neg Hx   . Colon polyps Neg Hx   . Kidney disease Neg Hx   . Esophageal cancer Neg Hx   . Gallbladder disease Neg Hx     History   Social History  . Marital Status: Single    Spouse Name: N/A  . Number of Children: 1  . Years of Education: N/A   Occupational History  . Owner of [employer]    Social History Main Topics  . Smoking status: Never Smoker   . Smokeless tobacco: Never Used  . Alcohol Use: 0.0 oz/week    0 Standard drinks or equivalent per week     Comment: Occassionally  . Drug Use: No  . Sexual Activity: No   Other Topics Concern  . None   Social History Narrative    Review of Systems: A 12 point ROS discussed and pertinent positives are indicated in the HPI above.  All other systems are negative.  Review of Systems  Constitutional: Positive for fatigue.  HENT: Negative.   Allergic/Immunologic: Positive for environmental allergies.    Vital Signs: BP 119/63 mmHg  Pulse 72  Temp(Src) 98.6 F (37 C) (Oral)  Resp 14  Wt 265 lb (120.203 kg)  SpO2 99%  LMP 05/30/2014 (Exact Date)  Physical Exam  Constitutional: She is oriented to person, place, and time. She appears well-developed and well-nourished. No distress.  HENT:  Head: Normocephalic and atraumatic.  Eyes: No scleral icterus.  Cardiovascular: Normal rate, regular rhythm, normal heart sounds and intact distal pulses.   Pulmonary/Chest: Effort normal and breath sounds normal.  Abdominal: Soft. She exhibits no distension. There is tenderness.  TTP low midline abdomen over region of uterus   Neurological: She is alert and oriented to person, place, and time.  Skin: Skin is warm and dry.  Psychiatric: She has a normal mood and affect. Her behavior  is normal.  Nursing note and vitals reviewed.   Mallampati Score: 2  Imaging: No results found.  Labs:  CBC:  Recent Labs  08/30/13 1116 02/26/14 2145 05/24/14 1644  WBC 4.9 6.7 5.7  HGB 9.9* 8.6* 9.7*  HCT 29.9* 27.6* 29.7*  PLT 252 270 232    COAGS: No results for input(s): INR, APTT in the last 8760 hours.  BMP:  Recent Labs  08/30/13 1116 02/26/14 2145 05/24/14 1644  NA 137 140 136  K 4.3 3.9 3.7  CL 106 105 107  CO2 24 24 19   GLUCOSE 76 96 103*  BUN 16 17 15   CALCIUM 9.2 8.9 8.4  CREATININE 0.90 1.00 0.81  GFRNONAA  --  66*  --   GFRAA  --  76*  --     LIVER  FUNCTION TESTS:  Recent Labs  08/30/13 1116 05/24/14 1644  BILITOT 0.3 0.3  AST 13 13  ALT 9 11  ALKPHOS 54 50  PROT 6.8 6.6  ALBUMIN 3.8 3.8    TUMOR MARKERS: No results for input(s): AFPTM, CEA, CA199, CHROMGRNA in the last 8760 hours.  Assessment and Plan:  49 year old female with significant symptomatic uterine fibroids. Her primary symptoms are severe menorrhagia complicated by persistent anemia and dysmenorrhea. Her uterine fibroid symptom severity score is 96.9 while her health-related quality of life score is 3.5.  The treatment options for symptomatically uterine fibroids were discussed at length. Birth control is unlikely to be beneficial given the severity of her symptoms. Similarly, Lupron can only be used for short periods of time before significant osteoporosis becomes an issue. Endometrial ablation may help her bleeding but is unlikely to control her dysmenorrhea and bulk related symptoms.  We discussed uterine artery embolization and she has discussed myomectomy and hysterectomy with her gynecologist.  At this time, she is favoring uterine artery embolization.  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  1.) Schedule pelvic MRI with and without contrast as soon as possible to assess fibroid anatomy  and exclude other causes of her menorrhagia and dysmenorrhea.  2.) Barring any unexpected findings on the pelvic MRI we will proceed with scheduling her uterine artery embolization.  Thank you for this interesting consult.  I greatly enjoyed meeting Denise Schroeder and look forward to participating in their care.  SignedJacqulynn Cadet 05/31/2014, 3:01 PM   I spent a total of  45 Minutes in face to face in clinical consultation, greater than 50% of which was counseling/coordinating care for symptomatic uterine fibroids.

## 2014-06-01 ENCOUNTER — Ambulatory Visit
Admission: RE | Admit: 2014-06-01 | Discharge: 2014-06-01 | Disposition: A | Discharge status: Home / Self Care | Payer: BLUE CROSS/BLUE SHIELD | Source: Ambulatory Visit | Attending: Obstetrics and Gynecology | Admitting: Obstetrics and Gynecology

## 2014-06-01 DIAGNOSIS — D259 Leiomyoma of uterus, unspecified: Secondary | ICD-10-CM

## 2014-06-04 ENCOUNTER — Other Ambulatory Visit: Payer: Self-pay | Admitting: Interventional Radiology

## 2014-06-04 DIAGNOSIS — D251 Intramural leiomyoma of uterus: Secondary | ICD-10-CM

## 2014-06-06 ENCOUNTER — Telehealth: Payer: Self-pay | Admitting: Radiology

## 2014-06-06 ENCOUNTER — Other Ambulatory Visit: Payer: Self-pay | Admitting: *Deleted

## 2014-06-06 NOTE — Telephone Encounter (Signed)
Phoned patient to review pre [place] MR per Dr Laurence Ferrari.  Patient is scheduled for [place] on 06/12/2014 at [facility]  Jaxxen Voong Riki Rusk, RN 06/06/2014 11:40 AM

## 2014-06-08 ENCOUNTER — Other Ambulatory Visit: Payer: Self-pay | Admitting: Radiology

## 2014-06-12 ENCOUNTER — Other Ambulatory Visit: Payer: Self-pay | Admitting: Interventional Radiology

## 2014-06-12 ENCOUNTER — Encounter (HOSPITAL_COMMUNITY): Payer: Self-pay

## 2014-06-12 ENCOUNTER — Observation Stay (HOSPITAL_COMMUNITY)
Admission: RE | Admit: 2014-06-12 | Discharge: 2014-06-13 | Disposition: A | Discharge status: Home / Self Care | Payer: BLUE CROSS/BLUE SHIELD | Source: Ambulatory Visit | Attending: Interventional Radiology | Admitting: Interventional Radiology

## 2014-06-12 ENCOUNTER — Ambulatory Visit (HOSPITAL_COMMUNITY)
Admission: RE | Admit: 2014-06-12 | Discharge: 2014-06-12 | Disposition: A | Discharge status: Home / Self Care | Payer: BLUE CROSS/BLUE SHIELD | Source: Ambulatory Visit | Attending: Interventional Radiology | Admitting: Interventional Radiology

## 2014-06-12 DIAGNOSIS — D251 Intramural leiomyoma of uterus: Secondary | ICD-10-CM

## 2014-06-12 DIAGNOSIS — E669 Obesity, unspecified: Secondary | ICD-10-CM | POA: Diagnosis not present

## 2014-06-12 DIAGNOSIS — E063 Autoimmune thyroiditis: Secondary | ICD-10-CM | POA: Insufficient documentation

## 2014-06-12 DIAGNOSIS — Z91040 Latex allergy status: Secondary | ICD-10-CM | POA: Diagnosis not present

## 2014-06-12 DIAGNOSIS — Z833 Family history of diabetes mellitus: Secondary | ICD-10-CM | POA: Insufficient documentation

## 2014-06-12 DIAGNOSIS — K219 Gastro-esophageal reflux disease without esophagitis: Secondary | ICD-10-CM | POA: Insufficient documentation

## 2014-06-12 DIAGNOSIS — Z6841 Body Mass Index (BMI) 40.0 and over, adult: Secondary | ICD-10-CM | POA: Diagnosis not present

## 2014-06-12 DIAGNOSIS — Z91018 Allergy to other foods: Secondary | ICD-10-CM | POA: Diagnosis not present

## 2014-06-12 DIAGNOSIS — D259 Leiomyoma of uterus, unspecified: Secondary | ICD-10-CM | POA: Diagnosis not present

## 2014-06-12 DIAGNOSIS — Z8249 Family history of ischemic heart disease and other diseases of the circulatory system: Secondary | ICD-10-CM | POA: Diagnosis not present

## 2014-06-12 DIAGNOSIS — E049 Nontoxic goiter, unspecified: Secondary | ICD-10-CM | POA: Diagnosis not present

## 2014-06-12 LAB — BASIC METABOLIC PANEL
ANION GAP: 9 (ref 5–15)
BUN: 14 mg/dL (ref 6–23)
CO2: 22 mmol/L (ref 19–32)
CREATININE: 0.86 mg/dL (ref 0.50–1.10)
Calcium: 9.1 mg/dL (ref 8.4–10.5)
Chloride: 107 mmol/L (ref 96–112)
GFR, EST NON AFRICAN AMERICAN: 78 mL/min — AB (ref >90)
Glucose, Bld: 84 mg/dL (ref 70–99)
POTASSIUM: 4.3 mmol/L (ref 3.5–5.1)
Sodium: 138 mmol/L (ref 135–145)

## 2014-06-12 LAB — CBC
HEMATOCRIT: 32.3 % — AB (ref 36.0–46.0)
HEMOGLOBIN: 9.8 g/dL — AB (ref 12.0–15.0)
MCH: 26 pg (ref 26.0–34.0)
MCHC: 30.3 g/dL (ref 30.0–36.0)
MCV: 85.7 fL (ref 78.0–100.0)
Platelets: 277 10*3/uL (ref 150–400)
RBC: 3.77 MIL/uL — ABNORMAL LOW (ref 3.87–5.11)
RDW: 16.5 % — ABNORMAL HIGH (ref 11.5–15.5)
WBC: 5.8 10*3/uL (ref 4.0–10.5)

## 2014-06-12 LAB — HCG, SERUM, QUALITATIVE: PREG SERUM: NEGATIVE

## 2014-06-12 LAB — APTT: APTT: 25 s (ref 24–37)

## 2014-06-12 LAB — PROTIME-INR
INR: 0.99 (ref 0.00–1.49)
Prothrombin Time: 13.2 seconds (ref 11.6–15.2)

## 2014-06-12 MED ORDER — BUPIVACAINE HCL (PF) 0.25 % IJ SOLN
INTRAMUSCULAR | Status: AC
  Filled 2014-06-12: qty 30

## 2014-06-12 MED ORDER — HYDROMORPHONE 0.3 MG/ML IV SOLN
INTRAVENOUS | Status: DC
  Administered 2014-06-12: 3.6 mg via INTRAVENOUS
  Administered 2014-06-13: 2.4 mg via INTRAVENOUS
  Administered 2014-06-13: 1.64 mg via INTRAVENOUS
  Administered 2014-06-13: 01:00:00 via INTRAVENOUS
  Administered 2014-06-13: 2.1 mg via INTRAVENOUS
  Filled 2014-06-12: qty 25

## 2014-06-12 MED ORDER — DIPHENHYDRAMINE HCL 12.5 MG/5ML PO ELIX: 12.5000 mg | ORAL_SOLUTION | Freq: Four times a day (QID) | ORAL | Status: DC | PRN

## 2014-06-12 MED ORDER — ONDANSETRON HCL 4 MG/2ML IJ SOLN
INTRAMUSCULAR | Status: AC
  Filled 2014-06-12: qty 2

## 2014-06-12 MED ORDER — SODIUM CHLORIDE 0.9 % IJ SOLN
3.0000 mL | INTRAMUSCULAR | Status: DC | PRN
Start: 2014-06-12 — End: 2014-06-13

## 2014-06-12 MED ORDER — FENTANYL CITRATE 0.05 MG/ML IJ SOLN
INTRAMUSCULAR | Status: AC | PRN
  Administered 2014-06-12 (×6): 25 ug via INTRAVENOUS

## 2014-06-12 MED ORDER — MAGNESIUM HYDROXIDE 400 MG/5ML PO SUSP
30.0000 mL | Freq: Every day | ORAL | Status: DC
  Administered 2014-06-12: 30 mL via ORAL
  Filled 2014-06-12: qty 30

## 2014-06-12 MED ORDER — NALOXONE HCL 0.4 MG/ML IJ SOLN: 0.4000 mg | INTRAMUSCULAR | Status: DC | PRN

## 2014-06-12 MED ORDER — DIPHENHYDRAMINE HCL 50 MG/ML IJ SOLN: 12.5000 mg | Freq: Four times a day (QID) | INTRAMUSCULAR | Status: DC | PRN

## 2014-06-12 MED ORDER — PROMETHAZINE HCL 25 MG RE SUPP: 25.0000 mg | Freq: Three times a day (TID) | RECTAL | Status: DC | PRN

## 2014-06-12 MED ORDER — IOHEXOL 300 MG/ML  SOLN
50.0000 mL | Freq: Once | INTRAMUSCULAR | Status: AC | PRN
  Administered 2014-06-12: 100 mL

## 2014-06-12 MED ORDER — DOCUSATE SODIUM 100 MG PO CAPS
100.0000 mg | ORAL_CAPSULE | Freq: Two times a day (BID) | ORAL | Status: DC
  Administered 2014-06-12: 100 mg via ORAL
  Filled 2014-06-12: qty 1

## 2014-06-12 MED ORDER — MIDAZOLAM HCL 2 MG/2ML IJ SOLN
INTRAMUSCULAR | Status: AC
  Filled 2014-06-12: qty 6

## 2014-06-12 MED ORDER — ONDANSETRON HCL 4 MG/2ML IJ SOLN: 4.0000 mg | Freq: Four times a day (QID) | INTRAMUSCULAR | Status: DC | PRN

## 2014-06-12 MED ORDER — HYDROMORPHONE HCL 2 MG/ML IJ SOLN
INTRAMUSCULAR | Status: AC
  Filled 2014-06-12: qty 1

## 2014-06-12 MED ORDER — FENTANYL CITRATE 0.05 MG/ML IJ SOLN
INTRAMUSCULAR | Status: AC
  Filled 2014-06-12: qty 4

## 2014-06-12 MED ORDER — ONDANSETRON HCL 4 MG/2ML IJ SOLN
4.0000 mg | Freq: Four times a day (QID) | INTRAMUSCULAR | Status: DC | PRN
  Administered 2014-06-12 – 2014-06-13 (×3): 4 mg via INTRAVENOUS
  Filled 2014-06-12 (×3): qty 2

## 2014-06-12 MED ORDER — SODIUM CHLORIDE 0.9 % IJ SOLN: 3.0000 mL | Freq: Two times a day (BID) | INTRAMUSCULAR | Status: DC

## 2014-06-12 MED ORDER — LIDOCAINE HCL 1 % IJ SOLN
INTRAMUSCULAR | Status: AC
  Filled 2014-06-12: qty 20

## 2014-06-12 MED ORDER — HYDROMORPHONE 0.3 MG/ML IV SOLN
INTRAVENOUS | Status: DC
  Administered 2014-06-12: 16:00:00 via INTRAVENOUS

## 2014-06-12 MED ORDER — KETOROLAC TROMETHAMINE 30 MG/ML IJ SOLN
30.0000 mg | Freq: Once | INTRAMUSCULAR | Status: AC
  Administered 2014-06-12: 30 mg via INTRAVENOUS
  Filled 2014-06-12: qty 1

## 2014-06-12 MED ORDER — ENOXAPARIN SODIUM 60 MG/0.6ML ~~LOC~~ SOLN
60.0000 mg | SUBCUTANEOUS | Status: DC
  Administered 2014-06-13: 60 mg via SUBCUTANEOUS
  Filled 2014-06-12: qty 0.6

## 2014-06-12 MED ORDER — DEXAMETHASONE 4 MG PO TABS
8.0000 mg | ORAL_TABLET | Freq: Once | ORAL | Status: AC
  Administered 2014-06-12: 8 mg via ORAL
  Filled 2014-06-12: qty 2

## 2014-06-12 MED ORDER — CEFTRIAXONE SODIUM IN DEXTROSE 20 MG/ML IV SOLN
1.0000 g | Freq: Once | INTRAVENOUS | Status: AC
  Administered 2014-06-12: 1 g via INTRAVENOUS
  Filled 2014-06-12: qty 50

## 2014-06-12 MED ORDER — HYDROMORPHONE 0.3 MG/ML IV SOLN
INTRAVENOUS | Status: AC
  Administered 2014-06-12: 0.9 mg
  Filled 2014-06-12: qty 25

## 2014-06-12 MED ORDER — KETOROLAC TROMETHAMINE 30 MG/ML IJ SOLN
30.0000 mg | Freq: Four times a day (QID) | INTRAMUSCULAR | Status: DC
  Administered 2014-06-12 – 2014-06-13 (×4): 30 mg via INTRAVENOUS
  Filled 2014-06-12 (×4): qty 1

## 2014-06-12 MED ORDER — PROMETHAZINE HCL 25 MG PO TABS
25.0000 mg | ORAL_TABLET | Freq: Three times a day (TID) | ORAL | Status: DC | PRN
  Administered 2014-06-13: 25 mg via ORAL
  Filled 2014-06-12: qty 1

## 2014-06-12 MED ORDER — ONDANSETRON HCL 4 MG/2ML IJ SOLN
4.0000 mg | Freq: Once | INTRAMUSCULAR | Status: AC
  Administered 2014-06-12: 4 mg via INTRAVENOUS

## 2014-06-12 MED ORDER — HYDROCODONE-ACETAMINOPHEN 5-325 MG PO TABS
1.0000 | ORAL_TABLET | ORAL | Status: DC | PRN
  Administered 2014-06-13: 2 via ORAL
  Filled 2014-06-12: qty 2

## 2014-06-12 MED ORDER — PREDNISONE 20 MG PO TABS
20.0000 mg | ORAL_TABLET | Freq: Every day | ORAL | Status: DC
  Filled 2014-06-12: qty 1

## 2014-06-12 MED ORDER — SODIUM CHLORIDE 0.9 % IJ SOLN: 9.0000 mL | INTRAMUSCULAR | Status: DC | PRN

## 2014-06-12 MED ORDER — DEXAMETHASONE SODIUM PHOSPHATE 10 MG/ML IJ SOLN
4.0000 mg | Freq: Once | INTRAMUSCULAR | Status: AC
  Administered 2014-06-12: 4 mg via INTRAVENOUS
  Filled 2014-06-12: qty 1
  Filled 2014-06-12: qty 0.4

## 2014-06-12 MED ORDER — HYDROMORPHONE HCL 1 MG/ML IJ SOLN
INTRAMUSCULAR | Status: AC | PRN
  Administered 2014-06-12 (×3): 0.5 mg via INTRAVENOUS

## 2014-06-12 MED ORDER — MIDAZOLAM HCL 2 MG/2ML IJ SOLN
INTRAMUSCULAR | Status: AC | PRN
  Administered 2014-06-12 (×4): 0.5 mg via INTRAVENOUS
  Administered 2014-06-12: 1 mg via INTRAVENOUS
  Administered 2014-06-12 (×2): 0.5 mg via INTRAVENOUS
  Administered 2014-06-12: 1 mg via INTRAVENOUS
  Administered 2014-06-12 (×2): 0.5 mg via INTRAVENOUS

## 2014-06-12 MED ORDER — SODIUM CHLORIDE 0.9 % IV SOLN: 250.0000 mL | INTRAVENOUS | Status: DC | PRN

## 2014-06-12 MED ORDER — SODIUM CHLORIDE 0.9 % IV SOLN
INTRAVENOUS | Status: AC
  Administered 2014-06-12 (×2): via INTRAVENOUS

## 2014-06-12 MED ORDER — SODIUM CHLORIDE 0.9 % IV SOLN: INTRAVENOUS | Status: DC

## 2014-06-12 NOTE — H&P (Signed)
Chief Complaint: "I'm here for my fibroids treatment"  HPI: Denise Schroeder is an 49 y.o. female with symptomatic uterine fibroids. She was seen in consult by Dr. Laurence Ferrari and is now scheduled for uterine artery embolization procedure. See Dr. Laurence Ferrari consult note for full details. Pt has been NPO since 0700 today PMHx, meds, allergies reviewed. She feels well, no recent illness or fevers.  Past Medical History:  Past Medical History  Diagnosis Date  . GERD (gastroesophageal reflux disease)   . Anemia   . Allergy   . Hashimoto thyroiditis   . Goiter   . Obesity   . Uterine fibroid     Past Surgical History:  Past Surgical History  Procedure Laterality Date  . Cholecystectomy      Family History:  Family History  Problem Relation Age of Onset  . Diabetes Mother   . Hypertension Mother   . Asthma Son   . Diabetes Maternal Aunt   . Diabetes Maternal Uncle   . Stroke Neg Hx   . Heart disease Neg Hx   . Hyperlipidemia Neg Hx   . Colon cancer Neg Hx   . Colon polyps Neg Hx   . Kidney disease Neg Hx   . Esophageal cancer Neg Hx   . Gallbladder disease Neg Hx     Social History:  reports that she has never smoked. She has never used smokeless tobacco. She reports that she drinks alcohol. She reports that she does not use illicit drugs.  Allergies:  Allergies  Allergen Reactions  . Citrus Itching, Swelling and Cough    All fruits cause this reaction  . Latex Hives    Medications: Synthroid, Zyrtec, Motrin, Nasonex, MVI, Epi-pep  Please HPI for pertinent positives, otherwise complete 10 system ROS negative.  Physical Exam: BP 132/67 mmHg  Pulse 72  Temp(Src) 98.5 F (36.9 C) (Oral)  Resp 16  SpO2 100%  LMP 05/30/2014 (Exact Date) There is no weight on file to calculate BMI.   General Appearance:  Alert, cooperative, no distress, appears stated age  Head:  Normocephalic, without obvious abnormality, atraumatic  ENT: Unremarkable  Neck: Supple,  symmetrical, trachea midline  Lungs:   Clear to auscultation bilaterally, no w/r/r, respirations unlabored without use of accessory muscles.  Heart:  Regular rate and rhythm, S1, S2 normal, no murmur, rub or gallop.  Abdomen:   Soft, non-tender, non distended.  Extremities: Extremities normal, atraumatic, no cyanosis or edema  Pulses: 2+ and symmetric femoral  Neurologic: Normal affect, no gross deficits.  Labs: No results found for this or any previous visit (from the past 48 hour(s)).  Imaging: No results found.  Assessment/Plan Symptomatic uterine fibroids For [place] procedure today. Labs pending Procedure, risks, complications, and plan for overnight admission discussed at length. Pt agreeable to proceed and has signed consent  Ascencion Dike PA-C 06/12/2014, 12:15 PM

## 2014-06-12 NOTE — Procedures (Signed)
Interventional Radiology Procedure Note  Procedure: Bilateral [place]  Access: Right CFA, 32F --> Cordis ExoSeal  Complications: None  Estimated Blood Loss: 0  Recommendations: - Bedrest x 4 hrs - IV hydration - ADAT - Foley out once ambulatory - Pain control - Bowel regimen  Signed,  Criselda Peaches, MD

## 2014-06-12 NOTE — Sedation Documentation (Addendum)
5Fr sheath removed from R fem artery by Dr. Laurence Ferrari. Hemostasis achieved using exoseal closure device and manual pressure held for 5 minutes by Dr. Laurence Ferrari. Groin level 0. RDP +2.

## 2014-06-13 ENCOUNTER — Other Ambulatory Visit: Payer: Self-pay | Admitting: Radiology

## 2014-06-13 DIAGNOSIS — D259 Leiomyoma of uterus, unspecified: Secondary | ICD-10-CM

## 2014-06-13 MED ORDER — HYDROCODONE-ACETAMINOPHEN 5-325 MG PO TABS: 1.0000 | ORAL_TABLET | ORAL | Status: DC | PRN

## 2014-06-13 MED ORDER — PREDNISONE 20 MG PO TABS: 10.0000 mg | ORAL_TABLET | ORAL | Status: DC

## 2014-06-13 MED ORDER — ONDANSETRON HCL 4 MG PO TABS: 4.0000 mg | ORAL_TABLET | Freq: Four times a day (QID) | ORAL | Status: DC | PRN

## 2014-06-13 MED ORDER — IBUPROFEN 800 MG PO TABS: 800.0000 mg | ORAL_TABLET | Freq: Three times a day (TID) | ORAL | Status: DC

## 2014-06-13 NOTE — Discharge Instructions (Addendum)
°  Uterine Artery Embolization for Fibroids, Care After Refer to this sheet in the next few weeks. These instructions provide you with information on caring for yourself after your procedure. Your health care provider may also give you more specific instructions. Your treatment has been planned according to current medical practices, but problems sometimes occur. Call your health care provider if you have any problems or questions after your procedure. WHAT TO EXPECT AFTER THE PROCEDURE After your procedure, it is typical to have cramping in the pelvis. You will be given pain medicine to control it. HOME CARE INSTRUCTIONS Only take over-the-counter or prescription medicines for pain, discomfort, or fever as directed by your health care provider. Do not take aspirin. It can cause bleeding. Follow your health care provider's advice regarding medicines given to you, diet, activity, and when to begin sexual activity. See your health care provider for follow-up care as directed. SEEK MEDICAL CARE IF: You have a fever. You have redness, swelling, and pain around your incision site. You have pus draining from your incision. You have a rash. SEEK IMMEDIATE MEDICAL CARE IF: You have bleeding from your incision site. You have difficulty breathing. You have chest pain. You have severe abdominal pain. You have leg pain. You become dizzy and faint. Document Released: 12/21/2012 Document Reviewed: 12/21/2012 [facility] Patient Information 2015 [place], [place]. This information is not intended to replace advice given to you by your health care provider. Make sure you discuss any questions you have with your health care provider.  r progress will be monitored until you are awake, stable, and taking fluids well. If there are no other problems, you will then be moved to a regular hospital room.  You will be observed overnight in the hospital.  You will have cramping that should be controlled with pain  medication. Document Released: 05/18/2005 Document Revised: 12/21/2012 Document Reviewed: 09/15/2012 [facility] Patient Information 2015 [place], [place]. This information is not intended to replace advice given to you by your health care provider. Make sure you discuss any questions you have with your health care provider.

## 2014-06-13 NOTE — Progress Notes (Signed)
Subjective: Pt s/p [place] yesterday. Had a rough night.  Pain not well controlled Still N/V this am, could not keep clears down  Objective: Physical Exam: BP 138/66 mmHg  Pulse 85  Temp(Src) 98.2 F (36.8 C) (Oral)  Resp 13  Ht 5\' 8"  (1.727 m)  Wt 272 lb 3.2 oz (123.469 kg)  BMI 41.40 kg/m2  SpO2 92%  LMP 05/30/2014 (Exact Date) Abd: soft, ND, mildly tender suprapubic region Ext: (R)groin soft, NT, no hematoma    Labs: CBC  Recent Labs  06/12/14 1215  WBC 5.8  HGB 9.8*  HCT 32.3*  PLT 277   BMET  Recent Labs  06/12/14 1215  NA 138  K 4.3  CL 107  CO2 22  GLUCOSE 84  BUN 14  CREATININE 0.86  CALCIUM 9.1   LFT No results for input(s): PROT, ALBUMIN, AST, ALT, ALKPHOS, BILITOT, BILIDIR, IBILI, LIPASE in the last 72 hours. PT/INR  Recent Labs  06/12/14 1215  LABPROT 13.2  INR 0.99     Studies/Results: Ir Aortagram Abdominal Serialogram  06/12/2014   CLINICAL DATA:  49 year old female with symptomatic uterine fibroids. She presents today for attempted inferior hypogastric nerve block followed by bilateral uterine artery embolization.  EXAM: IR EMBO TUMOR ORGAN ISCHEMIA INFARCT INC GUIDE ROADMAPPING; PELVIC SELECTIVE ARTERIOGRAPHY; AORTOGRAPHY ; IR ULTRASOUND GUIDANCE VASC ACCESS RIGHT; ADDITIONAL ARTERIOGRAPHY  Date: 06/12/2014  PROCEDURE: 1. Ultrasound-guided puncture of the right common femoral artery 2. Up and over catheterization of the left internal iliac artery 3. Attempted inferior hypogastric nerve block 4. Left internal iliac arteriogram 5. Selective catheterization of the left uterine artery with arteriogram 6. Particle embolization of left uterine artery to near stasis 7. Catheterization of the ipsilateral right uterine artery with arteriogram 8. Particle embolization of right uterine artery to near stasis 9. Flush aortogram Interventional Radiologist:  Criselda Peaches, MD  ANESTHESIA/SEDATION: Moderate (conscious) sedation was used. 6 mg Versed,  150 mcg Fentanyl and 1.5 mg Dilaudid were administered intravenously. The patient's vital signs were monitored continuously by radiology nursing throughout the procedure.  Sedation Time: 85 minutes  MEDICATIONS: 4 mg Decadron, 4 mg Zofran, 30 mg Toradol and 1 g Rocephin administered intravenously within 1 hour of skin incision.  FLUOROSCOPY TIME:  11 minutes 54 seconds  2579 mGy  CONTRAST:  151mL OMNIPAQUE IOHEXOL 300 MG/ML  SOLN  TECHNIQUE: Informed consent was obtained from the patient following explanation of the procedure, risks, benefits and alternatives. The patient understands, agrees and consents for the procedure. All questions were addressed. A time out was performed.  Maximal barrier sterile technique utilized including caps, mask, sterile gowns, sterile gloves, large sterile drape, hand hygiene, and Betadine skin prep.  The right groin was interrogated with ultrasound. The right common femoral artery is widely patent. An image was obtained and stored for the medical record. Local anesthesia was attained by infiltration with 1% lidocaine. A small dermatotomy was made. Under real-time sonographic guidance, the vessel was punctured with a 21 gauge micropuncture needle. An image was obtained and stored.  Using a transitional 5 French micro sheath the initial micro wire was exchanged for a 0.035 Bentson wire. The micro sheath was then exchanged for a working 5 [place] vascular sheath. A C2 cobra catheter was advanced up and over the aortic bifurcation and into the left internal iliac artery.  An attempt was then made at inferior hypogastric nerve block. Unfortunately, due to very dense and likely partially degenerated uterine fibroids, the needle could not be advanced to  the anterior surface of the L5 vertebral body. Therefore, the hypogastric nerve block was aborted.  Attention was again turned to the angiographic catheter. A left internal iliac arteriogram was performed confirming the origin of the hyper  trophic uterine artery. A Renegade high flow micro catheter was then advanced over a found on wire and into the horizontal portion of the artery. An arteriogram was again performed confirming robust perfusion of the uterus and the numerous uterine fibroids. Particle embolization was then performed to near stasis using 1 vial of 500 - 700 micron embospheres and 4.8 vials of 700 - 900 micron embospheres. Once near stasis was achieved, a post embolization arteriogram was performed confirming reflux along the uterine artery and successful pruning of the uterine arterial vessels.  The micro catheter was then removed. Using standard technique, the C2 cobra catheter was formed into a Waltman is a loop an used to select the ipsilateral uterine artery. A uterine arteriogram was performed confirming a hyper trophic uterine artery providing significant supply to the uterus and uterine fibroids. The high-flow renegade micro catheter was advanced into the horizontal segment of the artery and a arteriogram was performed confirming no evidence of prominent vesicular or vaginal branches. Particle embolization was then performed again to near stasis using 1 vial of 500 - 700 micron embospheres and 3 vials of 700 - 900 micron embospheres. Post embolization arteriography confirms near stasis within the vessel.  The micro catheter was removed. The Waltman loop was on formed in the [place] catheter removed over the Bentson wire. A pigtail catheter was then advanced to the L1 level in a flush aortogram was performed. 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 The left hepatic artery appears replaced to the left gastric artery in the right hepatic artery is replaced to the SMA. Bilateral renal arteries are unremarkable. No evidence of stenosis or fibromuscular dysplasia. The SMA and IMA are widely patent. There is no hypertrophic ovarian artery.  The pigtail catheter was removed over a wire. A limited right common femoral arteriogram was  then performed confirming the arterial access. Hemostasis was then attained with the assistance of a Cordis ExoSeal extra arterial vascular plug.  COMPLICATIONS: None  IMPRESSION: 1. Unsuccessful attempted inferior hypogastric nerve block. 2. Successful bilateral uterine artery embolization. Signed,  Criselda Peaches, MD  [facility]  [facility]   Electronically Signed   By: Jacqulynn Cadet M.D.   On: 06/12/2014 17:36   Ir Angiogram Pelvis Selective Or Supraselective  06/12/2014   CLINICAL DATA:  49 year old female with symptomatic uterine fibroids. She presents today for attempted inferior hypogastric nerve block followed by bilateral uterine artery embolization.  EXAM: IR EMBO TUMOR ORGAN ISCHEMIA INFARCT INC GUIDE ROADMAPPING; PELVIC SELECTIVE ARTERIOGRAPHY; AORTOGRAPHY ; IR ULTRASOUND GUIDANCE VASC ACCESS RIGHT; ADDITIONAL ARTERIOGRAPHY  Date: 06/12/2014  PROCEDURE: 1. Ultrasound-guided puncture of the right common femoral artery 2. Up and over catheterization of the left internal iliac artery 3. Attempted inferior hypogastric nerve block 4. Left internal iliac arteriogram 5. Selective catheterization of the left uterine artery with arteriogram 6. Particle embolization of left uterine artery to near stasis 7. Catheterization of the ipsilateral right uterine artery with arteriogram 8. Particle embolization of right uterine artery to near stasis 9. Flush aortogram Interventional Radiologist:  Criselda Peaches, MD  ANESTHESIA/SEDATION: Moderate (conscious) sedation was used. 6 mg Versed, 150 mcg Fentanyl and 1.5 mg Dilaudid were administered intravenously. The patient's vital signs were monitored continuously by radiology nursing throughout the procedure.  Sedation Time: 85 minutes  MEDICATIONS: 4 mg Decadron, 4 mg Zofran, 30 mg Toradol and 1 g Rocephin administered intravenously within 1 hour of skin incision.  FLUOROSCOPY TIME:  11 minutes 54 seconds  2579  mGy  CONTRAST:  123mL OMNIPAQUE IOHEXOL 300 MG/ML  SOLN  TECHNIQUE: Informed consent was obtained from the patient following explanation of the procedure, risks, benefits and alternatives. The patient understands, agrees and consents for the procedure. All questions were addressed. A time out was performed.  Maximal barrier sterile technique utilized including caps, mask, sterile gowns, sterile gloves, large sterile drape, hand hygiene, and Betadine skin prep.  The right groin was interrogated with ultrasound. The right common femoral artery is widely patent. An image was obtained and stored for the medical record. Local anesthesia was attained by infiltration with 1% lidocaine. A small dermatotomy was made. Under real-time sonographic guidance, the vessel was punctured with a 21 gauge micropuncture needle. An image was obtained and stored.  Using a transitional 5 French micro sheath the initial micro wire was exchanged for a 0.035 Bentson wire. The micro sheath was then exchanged for a working 5 [place] vascular sheath. A C2 cobra catheter was advanced up and over the aortic bifurcation and into the left internal iliac artery.  An attempt was then made at inferior hypogastric nerve block. Unfortunately, due to very dense and likely partially degenerated uterine fibroids, the needle could not be advanced to the anterior surface of the L5 vertebral body. Therefore, the hypogastric nerve block was aborted.  Attention was again turned to the angiographic catheter. A left internal iliac arteriogram was performed confirming the origin of the hyper trophic uterine artery. A Renegade high flow micro catheter was then advanced over a found on wire and into the horizontal portion of the artery. An arteriogram was again performed confirming robust perfusion of the uterus and the numerous uterine fibroids. Particle embolization was then performed to near stasis using 1 vial of 500 - 700 micron embospheres and 4.8 vials of 700 -  900 micron embospheres. Once near stasis was achieved, a post embolization arteriogram was performed confirming reflux along the uterine artery and successful pruning of the uterine arterial vessels.  The micro catheter was then removed. Using standard technique, the C2 cobra catheter was formed into a Waltman is a loop an used to select the ipsilateral uterine artery. A uterine arteriogram was performed confirming a hyper trophic uterine artery providing significant supply to the uterus and uterine fibroids. The high-flow renegade micro catheter was advanced into the horizontal segment of the artery and a arteriogram was performed confirming no evidence of prominent vesicular or vaginal branches. Particle embolization was then performed again to near stasis using 1 vial of 500 - 700 micron embospheres and 3 vials of 700 - 900 micron embospheres. Post embolization arteriography confirms near stasis within the vessel.  The micro catheter was removed. The Waltman loop was on formed in the [place] catheter removed over the Bentson wire. A pigtail catheter was then advanced to the L1 level in a flush aortogram was performed. 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 The left hepatic artery appears replaced to the left gastric artery in the right hepatic artery is replaced to the SMA. Bilateral renal arteries are unremarkable. No evidence of stenosis or fibromuscular dysplasia. The SMA and IMA are widely patent. There is no hypertrophic ovarian artery.  The pigtail catheter was removed over a wire. A limited right common femoral arteriogram was then performed confirming the  arterial access. Hemostasis was then attained with the assistance of a Cordis ExoSeal extra arterial vascular plug.  COMPLICATIONS: None  IMPRESSION: 1. Unsuccessful attempted inferior hypogastric nerve block. 2. Successful bilateral uterine artery embolization. Signed,  Criselda Peaches, MD  [facility]   [facility]   Electronically Signed   By: Jacqulynn Cadet M.D.   On: 06/12/2014 17:36   Ir Angiogram Pelvis Selective Or Supraselective  06/12/2014   CLINICAL DATA:  49 year old female with symptomatic uterine fibroids. She presents today for attempted inferior hypogastric nerve block followed by bilateral uterine artery embolization.  EXAM: IR EMBO TUMOR ORGAN ISCHEMIA INFARCT INC GUIDE ROADMAPPING; PELVIC SELECTIVE ARTERIOGRAPHY; AORTOGRAPHY ; IR ULTRASOUND GUIDANCE VASC ACCESS RIGHT; ADDITIONAL ARTERIOGRAPHY  Date: 06/12/2014  PROCEDURE: 1. Ultrasound-guided puncture of the right common femoral artery 2. Up and over catheterization of the left internal iliac artery 3. Attempted inferior hypogastric nerve block 4. Left internal iliac arteriogram 5. Selective catheterization of the left uterine artery with arteriogram 6. Particle embolization of left uterine artery to near stasis 7. Catheterization of the ipsilateral right uterine artery with arteriogram 8. Particle embolization of right uterine artery to near stasis 9. Flush aortogram Interventional Radiologist:  Criselda Peaches, MD  ANESTHESIA/SEDATION: Moderate (conscious) sedation was used. 6 mg Versed, 150 mcg Fentanyl and 1.5 mg Dilaudid were administered intravenously. The patient's vital signs were monitored continuously by radiology nursing throughout the procedure.  Sedation Time: 85 minutes  MEDICATIONS: 4 mg Decadron, 4 mg Zofran, 30 mg Toradol and 1 g Rocephin administered intravenously within 1 hour of skin incision.  FLUOROSCOPY TIME:  11 minutes 54 seconds  2579 mGy  CONTRAST:  155mL OMNIPAQUE IOHEXOL 300 MG/ML  SOLN  TECHNIQUE: Informed consent was obtained from the patient following explanation of the procedure, risks, benefits and alternatives. The patient understands, agrees and consents for the procedure. All questions were addressed. A time out was performed.  Maximal barrier sterile technique utilized including caps, mask,  sterile gowns, sterile gloves, large sterile drape, hand hygiene, and Betadine skin prep.  The right groin was interrogated with ultrasound. The right common femoral artery is widely patent. An image was obtained and stored for the medical record. Local anesthesia was attained by infiltration with 1% lidocaine. A small dermatotomy was made. Under real-time sonographic guidance, the vessel was punctured with a 21 gauge micropuncture needle. An image was obtained and stored.  Using a transitional 5 French micro sheath the initial micro wire was exchanged for a 0.035 Bentson wire. The micro sheath was then exchanged for a working 5 [place] vascular sheath. A C2 cobra catheter was advanced up and over the aortic bifurcation and into the left internal iliac artery.  An attempt was then made at inferior hypogastric nerve block. Unfortunately, due to very dense and likely partially degenerated uterine fibroids, the needle could not be advanced to the anterior surface of the L5 vertebral body. Therefore, the hypogastric nerve block was aborted.  Attention was again turned to the angiographic catheter. A left internal iliac arteriogram was performed confirming the origin of the hyper trophic uterine artery. A Renegade high flow micro catheter was then advanced over a found on wire and into the horizontal portion of the artery. An arteriogram was again performed confirming robust perfusion of the uterus and the numerous uterine fibroids. Particle embolization was then performed to near stasis using 1 vial of 500 - 700 micron embospheres and 4.8 vials of 700 - 900 micron embospheres. Once near  stasis was achieved, a post embolization arteriogram was performed confirming reflux along the uterine artery and successful pruning of the uterine arterial vessels.  The micro catheter was then removed. Using standard technique, the C2 cobra catheter was formed into a Waltman is a loop an used to select the ipsilateral uterine artery. A  uterine arteriogram was performed confirming a hyper trophic uterine artery providing significant supply to the uterus and uterine fibroids. The high-flow renegade micro catheter was advanced into the horizontal segment of the artery and a arteriogram was performed confirming no evidence of prominent vesicular or vaginal branches. Particle embolization was then performed again to near stasis using 1 vial of 500 - 700 micron embospheres and 3 vials of 700 - 900 micron embospheres. Post embolization arteriography confirms near stasis within the vessel.  The micro catheter was removed. The Waltman loop was on formed in the [place] catheter removed over the Bentson wire. A pigtail catheter was then advanced to the L1 level in a flush aortogram was performed. 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 The left hepatic artery appears replaced to the left gastric artery in the right hepatic artery is replaced to the SMA. Bilateral renal arteries are unremarkable. No evidence of stenosis or fibromuscular dysplasia. The SMA and IMA are widely patent. There is no hypertrophic ovarian artery.  The pigtail catheter was removed over a wire. A limited right common femoral arteriogram was then performed confirming the arterial access. Hemostasis was then attained with the assistance of a Cordis ExoSeal extra arterial vascular plug.  COMPLICATIONS: None  IMPRESSION: 1. Unsuccessful attempted inferior hypogastric nerve block. 2. Successful bilateral uterine artery embolization. Signed,  Criselda Peaches, MD  [facility]  [facility]   Electronically Signed   By: Jacqulynn Cadet M.D.   On: 06/12/2014 17:36   Ir Angiogram Selective Each Additional Vessel  06/12/2014   CLINICAL DATA:  49 year old female with symptomatic uterine fibroids. She presents today for attempted inferior hypogastric nerve block followed by bilateral uterine artery embolization.  EXAM: IR EMBO TUMOR ORGAN  ISCHEMIA INFARCT INC GUIDE ROADMAPPING; PELVIC SELECTIVE ARTERIOGRAPHY; AORTOGRAPHY ; IR ULTRASOUND GUIDANCE VASC ACCESS RIGHT; ADDITIONAL ARTERIOGRAPHY  Date: 06/12/2014  PROCEDURE: 1. Ultrasound-guided puncture of the right common femoral artery 2. Up and over catheterization of the left internal iliac artery 3. Attempted inferior hypogastric nerve block 4. Left internal iliac arteriogram 5. Selective catheterization of the left uterine artery with arteriogram 6. Particle embolization of left uterine artery to near stasis 7. Catheterization of the ipsilateral right uterine artery with arteriogram 8. Particle embolization of right uterine artery to near stasis 9. Flush aortogram Interventional Radiologist:  Criselda Peaches, MD  ANESTHESIA/SEDATION: Moderate (conscious) sedation was used. 6 mg Versed, 150 mcg Fentanyl and 1.5 mg Dilaudid were administered intravenously. The patient's vital signs were monitored continuously by radiology nursing throughout the procedure.  Sedation Time: 85 minutes  MEDICATIONS: 4 mg Decadron, 4 mg Zofran, 30 mg Toradol and 1 g Rocephin administered intravenously within 1 hour of skin incision.  FLUOROSCOPY TIME:  11 minutes 54 seconds  2579 mGy  CONTRAST:  143mL OMNIPAQUE IOHEXOL 300 MG/ML  SOLN  TECHNIQUE: Informed consent was obtained from the patient following explanation of the procedure, risks, benefits and alternatives. The patient understands, agrees and consents for the procedure. All questions were addressed. A time out was performed.  Maximal barrier sterile technique utilized including caps, mask, sterile gowns, sterile gloves, large sterile drape, hand hygiene, and Betadine skin prep.  The right groin was interrogated with ultrasound. The right common femoral artery is widely patent. An image was obtained and stored for the medical record. Local anesthesia was attained by infiltration with 1% lidocaine. A small dermatotomy was made. Under real-time sonographic guidance,  the vessel was punctured with a 21 gauge micropuncture needle. An image was obtained and stored.  Using a transitional 5 French micro sheath the initial micro wire was exchanged for a 0.035 Bentson wire. The micro sheath was then exchanged for a working 5 [place] vascular sheath. A C2 cobra catheter was advanced up and over the aortic bifurcation and into the left internal iliac artery.  An attempt was then made at inferior hypogastric nerve block. Unfortunately, due to very dense and likely partially degenerated uterine fibroids, the needle could not be advanced to the anterior surface of the L5 vertebral body. Therefore, the hypogastric nerve block was aborted.  Attention was again turned to the angiographic catheter. A left internal iliac arteriogram was performed confirming the origin of the hyper trophic uterine artery. A Renegade high flow micro catheter was then advanced over a found on wire and into the horizontal portion of the artery. An arteriogram was again performed confirming robust perfusion of the uterus and the numerous uterine fibroids. Particle embolization was then performed to near stasis using 1 vial of 500 - 700 micron embospheres and 4.8 vials of 700 - 900 micron embospheres. Once near stasis was achieved, a post embolization arteriogram was performed confirming reflux along the uterine artery and successful pruning of the uterine arterial vessels.  The micro catheter was then removed. Using standard technique, the C2 cobra catheter was formed into a Waltman is a loop an used to select the ipsilateral uterine artery. A uterine arteriogram was performed confirming a hyper trophic uterine artery providing significant supply to the uterus and uterine fibroids. The high-flow renegade micro catheter was advanced into the horizontal segment of the artery and a arteriogram was performed confirming no evidence of prominent vesicular or vaginal branches. Particle embolization was then performed again  to near stasis using 1 vial of 500 - 700 micron embospheres and 3 vials of 700 - 900 micron embospheres. Post embolization arteriography confirms near stasis within the vessel.  The micro catheter was removed. The Waltman loop was on formed in the [place] catheter removed over the Bentson wire. A pigtail catheter was then advanced to the L1 level in a flush aortogram was performed. 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 The left hepatic artery appears replaced to the left gastric artery in the right hepatic artery is replaced to the SMA. Bilateral renal arteries are unremarkable. No evidence of stenosis or fibromuscular dysplasia. The SMA and IMA are widely patent. There is no hypertrophic ovarian artery.  The pigtail catheter was removed over a wire. A limited right common femoral arteriogram was then performed confirming the arterial access. Hemostasis was then attained with the assistance of a Cordis ExoSeal extra arterial vascular plug.  COMPLICATIONS: None  IMPRESSION: 1. Unsuccessful attempted inferior hypogastric nerve block. 2. Successful bilateral uterine artery embolization. Signed,  Criselda Peaches, MD  [facility]  [facility]   Electronically Signed   By: Jacqulynn Cadet M.D.   On: 06/12/2014 17:36   Ir US Guide Vasc Access Right  06/12/2014   CLINICAL DATA:  49 year old female with symptomatic uterine fibroids. She presents today for attempted inferior hypogastric nerve block followed by bilateral uterine artery embolization.  EXAM: IR EMBO TUMOR  ORGAN ISCHEMIA INFARCT INC GUIDE ROADMAPPING; PELVIC SELECTIVE ARTERIOGRAPHY; AORTOGRAPHY ; IR ULTRASOUND GUIDANCE VASC ACCESS RIGHT; ADDITIONAL ARTERIOGRAPHY  Date: 06/12/2014  PROCEDURE: 1. Ultrasound-guided puncture of the right common femoral artery 2. Up and over catheterization of the left internal iliac artery 3. Attempted inferior hypogastric nerve block 4. Left internal iliac arteriogram 5.  Selective catheterization of the left uterine artery with arteriogram 6. Particle embolization of left uterine artery to near stasis 7. Catheterization of the ipsilateral right uterine artery with arteriogram 8. Particle embolization of right uterine artery to near stasis 9. Flush aortogram Interventional Radiologist:  Criselda Peaches, MD  ANESTHESIA/SEDATION: Moderate (conscious) sedation was used. 6 mg Versed, 150 mcg Fentanyl and 1.5 mg Dilaudid were administered intravenously. The patient's vital signs were monitored continuously by radiology nursing throughout the procedure.  Sedation Time: 85 minutes  MEDICATIONS: 4 mg Decadron, 4 mg Zofran, 30 mg Toradol and 1 g Rocephin administered intravenously within 1 hour of skin incision.  FLUOROSCOPY TIME:  11 minutes 54 seconds  2579 mGy  CONTRAST:  133mL OMNIPAQUE IOHEXOL 300 MG/ML  SOLN  TECHNIQUE: Informed consent was obtained from the patient following explanation of the procedure, risks, benefits and alternatives. The patient understands, agrees and consents for the procedure. All questions were addressed. A time out was performed.  Maximal barrier sterile technique utilized including caps, mask, sterile gowns, sterile gloves, large sterile drape, hand hygiene, and Betadine skin prep.  The right groin was interrogated with ultrasound. The right common femoral artery is widely patent. An image was obtained and stored for the medical record. Local anesthesia was attained by infiltration with 1% lidocaine. A small dermatotomy was made. Under real-time sonographic guidance, the vessel was punctured with a 21 gauge micropuncture needle. An image was obtained and stored.  Using a transitional 5 French micro sheath the initial micro wire was exchanged for a 0.035 Bentson wire. The micro sheath was then exchanged for a working 5 [place] vascular sheath. A C2 cobra catheter was advanced up and over the aortic bifurcation and into the left internal iliac artery.  An  attempt was then made at inferior hypogastric nerve block. Unfortunately, due to very dense and likely partially degenerated uterine fibroids, the needle could not be advanced to the anterior surface of the L5 vertebral body. Therefore, the hypogastric nerve block was aborted.  Attention was again turned to the angiographic catheter. A left internal iliac arteriogram was performed confirming the origin of the hyper trophic uterine artery. A Renegade high flow micro catheter was then advanced over a found on wire and into the horizontal portion of the artery. An arteriogram was again performed confirming robust perfusion of the uterus and the numerous uterine fibroids. Particle embolization was then performed to near stasis using 1 vial of 500 - 700 micron embospheres and 4.8 vials of 700 - 900 micron embospheres. Once near stasis was achieved, a post embolization arteriogram was performed confirming reflux along the uterine artery and successful pruning of the uterine arterial vessels.  The micro catheter was then removed. Using standard technique, the C2 cobra catheter was formed into a Waltman is a loop an used to select the ipsilateral uterine artery. A uterine arteriogram was performed confirming a hyper trophic uterine artery providing significant supply to the uterus and uterine fibroids. The high-flow renegade micro catheter was advanced into the horizontal segment of the artery and a arteriogram was performed confirming no evidence of prominent vesicular or vaginal branches. Particle embolization was then performed again  to near stasis using 1 vial of 500 - 700 micron embospheres and 3 vials of 700 - 900 micron embospheres. Post embolization arteriography confirms near stasis within the vessel.  The micro catheter was removed. The Waltman loop was on formed in the [place] catheter removed over the Bentson wire. A pigtail catheter was then advanced to the L1 level in a flush aortogram was performed. There is  aberrant hepatic arterial anatomy. The left hepatic artery appears replaced to the left gastric artery in the right hepatic artery is replaced to the SMA. Bilateral renal arteries are unremarkable. No evidence of stenosis or fibromuscular dysplasia. The SMA and IMA are widely patent. There is no hypertrophic ovarian artery.  The pigtail catheter was removed over a wire. A limited right common femoral arteriogram was then performed confirming the arterial access. Hemostasis was then attained with the assistance of a Cordis ExoSeal extra arterial vascular plug.  COMPLICATIONS: None  IMPRESSION: 1. Unsuccessful attempted inferior hypogastric nerve block. 2. Successful bilateral uterine artery embolization. Signed,  Criselda Peaches, MD  [facility]  [facility]   Electronically Signed   By: Jacqulynn Cadet M.D.   On: 06/12/2014 17:36   Ir Embo Tumor Organ Ischemia Infarct Inc Guide Roadmapping  06/12/2014   CLINICAL DATA:  49 year old female with symptomatic uterine fibroids. She presents today for attempted inferior hypogastric nerve block followed by bilateral uterine artery embolization.  EXAM: IR EMBO TUMOR ORGAN ISCHEMIA INFARCT INC GUIDE ROADMAPPING; PELVIC SELECTIVE ARTERIOGRAPHY; AORTOGRAPHY ; IR ULTRASOUND GUIDANCE VASC ACCESS RIGHT; ADDITIONAL ARTERIOGRAPHY  Date: 06/12/2014  PROCEDURE: 1. Ultrasound-guided puncture of the right common femoral artery 2. Up and over catheterization of the left internal iliac artery 3. Attempted inferior hypogastric nerve block 4. Left internal iliac arteriogram 5. Selective catheterization of the left uterine artery with arteriogram 6. Particle embolization of left uterine artery to near stasis 7. Catheterization of the ipsilateral right uterine artery with arteriogram 8. Particle embolization of right uterine artery to near stasis 9. Flush aortogram Interventional Radiologist:  Criselda Peaches, MD  ANESTHESIA/SEDATION:  Moderate (conscious) sedation was used. 6 mg Versed, 150 mcg Fentanyl and 1.5 mg Dilaudid were administered intravenously. The patient's vital signs were monitored continuously by radiology nursing throughout the procedure.  Sedation Time: 85 minutes  MEDICATIONS: 4 mg Decadron, 4 mg Zofran, 30 mg Toradol and 1 g Rocephin administered intravenously within 1 hour of skin incision.  FLUOROSCOPY TIME:  11 minutes 54 seconds  2579 mGy  CONTRAST:  148mL OMNIPAQUE IOHEXOL 300 MG/ML  SOLN  TECHNIQUE: Informed consent was obtained from the patient following explanation of the procedure, risks, benefits and alternatives. The patient understands, agrees and consents for the procedure. All questions were addressed. A time out was performed.  Maximal barrier sterile technique utilized including caps, mask, sterile gowns, sterile gloves, large sterile drape, hand hygiene, and Betadine skin prep.  The right groin was interrogated with ultrasound. The right common femoral artery is widely patent. An image was obtained and stored for the medical record. Local anesthesia was attained by infiltration with 1% lidocaine. A small dermatotomy was made. Under real-time sonographic guidance, the vessel was punctured with a 21 gauge micropuncture needle. An image was obtained and stored.  Using a transitional 5 French micro sheath the initial micro wire was exchanged for a 0.035 Bentson wire. The micro sheath was then exchanged for a working 5 [place] vascular sheath. A C2 cobra catheter was advanced up and over the aortic bifurcation  and into the left internal iliac artery.  An attempt was then made at inferior hypogastric nerve block. Unfortunately, due to very dense and likely partially degenerated uterine fibroids, the needle could not be advanced to the anterior surface of the L5 vertebral body. Therefore, the hypogastric nerve block was aborted.  Attention was again turned to the angiographic catheter. A left internal iliac arteriogram  was performed confirming the origin of the hyper trophic uterine artery. A Renegade high flow micro catheter was then advanced over a found on wire and into the horizontal portion of the artery. An arteriogram was again performed confirming robust perfusion of the uterus and the numerous uterine fibroids. Particle embolization was then performed to near stasis using 1 vial of 500 - 700 micron embospheres and 4.8 vials of 700 - 900 micron embospheres. Once near stasis was achieved, a post embolization arteriogram was performed confirming reflux along the uterine artery and successful pruning of the uterine arterial vessels.  The micro catheter was then removed. Using standard technique, the C2 cobra catheter was formed into a Waltman is a loop an used to select the ipsilateral uterine artery. A uterine arteriogram was performed confirming a hyper trophic uterine artery providing significant supply to the uterus and uterine fibroids. The high-flow renegade micro catheter was advanced into the horizontal segment of the artery and a arteriogram was performed confirming no evidence of prominent vesicular or vaginal branches. Particle embolization was then performed again to near stasis using 1 vial of 500 - 700 micron embospheres and 3 vials of 700 - 900 micron embospheres. Post embolization arteriography confirms near stasis within the vessel.  The micro catheter was removed. The Waltman loop was on formed in the [place] [facility] [place] catheter removed over the Bentson wire. A pigtail catheter was then advanced to the L1 level in a flush aortogram was performed. 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 The left hepatic artery appears replaced to the left gastric artery in the right hepatic artery is replaced to the SMA. Bilateral renal arteries are unremarkable. No evidence of stenosis or fibromuscular dysplasia. The SMA and IMA are widely patent. There is no hypertrophic ovarian artery.  The pigtail catheter was removed over a wire.  A limited right common femoral arteriogram was then performed confirming the arterial access. Hemostasis was then attained with the assistance of a Cordis ExoSeal extra arterial vascular plug.  COMPLICATIONS: None  IMPRESSION: 1. Unsuccessful attempted inferior hypogastric nerve block. 2. Successful bilateral uterine artery embolization. Signed,  Criselda Peaches, MD  [facility]  [facility]   Electronically Signed   By: Jacqulynn Cadet M.D.   On: 06/12/2014 17:36    Assessment/Plan: Uterine artery embolization Post op N/V- may also be exacerbated by PCA Will DC PCA this am, use IV Toradol and Norco prn pain Zofran for nausea, Hopefully advance diet and DC home later today    Ascencion Dike PA-C 06/13/2014 9:09 AM

## 2014-06-13 NOTE — Progress Notes (Signed)
Patient stated she did not want RN to take out foley at this time. Plans made to take out foley at 0300.

## 2014-06-13 NOTE — Progress Notes (Signed)
UR completed 

## 2014-06-13 NOTE — Discharge Summary (Signed)
Physician Discharge Summary  Patient ID: Denise Schroeder MRN: [id number] DOB/AGE: July 01, 1965 49 y.o.  Admit date: 06/12/2014 Discharge date: 06/13/2014  Admission Diagnoses: Principal Problem:   Fibroid, uterine Active Problems:   Uterine fibroid  Discharge Diagnoses:  Principal Problem:   Fibroid, uterine Active Problems:   Uterine fibroid    Procedures: Bilateral uterine artery embolization 3/29  Discharged Condition: good  Hospital Course: Pt underwent successful [place] procedure on 3/29. Admitted to the floor in stable condition. Had some pain control issues and N/V until morning of POD #1. PCA discontinued and symptoms improved. Now tolerating some diet. Pain control much better She is stable for discharge All meds, instructions, and follow up plans reviewed.   Consults: None   Discharge Exam: Blood pressure 138/66, pulse 85, temperature 98.2 F (36.8 C), temperature source Oral, resp. rate 13, height 5\' 8"  (1.727 m), weight 272 lb 3.2 oz (123.469 kg), last menstrual period 05/30/2014, SpO2 92 %. General: NAD Heart: Reg Lungs: CTA Abd: soft, ND, mildly tender low abd Ext: right groin soft, no hematoma, pulses intact  Disposition: 01-Home or Self Care  Discharge Instructions    Call MD for:  difficulty breathing, headache or visual disturbances    Complete by:  As directed      Call MD for:  persistant nausea and vomiting    Complete by:  As directed      Call MD for:  redness, tenderness, or signs of infection (pain, swelling, redness, odor or green/yellow discharge around incision site)    Complete by:  As directed      Call MD for:  severe uncontrolled pain    Complete by:  As directed      Call MD for:  temperature >100.4    Complete by:  As directed      Diet - low sodium heart healthy    Complete by:  As directed      Increase activity slowly    Complete by:  As directed      No dressing needed    Complete by:  As directed             Medication  List    STOP taking these medications        promethazine 25 MG tablet  Commonly known as:  PHENERGAN      TAKE these medications        CALCIUM PO  Take 2 tablets by mouth at bedtime.     cetirizine 10 MG tablet  Commonly known as:  ZYRTEC  Take 10 mg by mouth daily as needed for allergies.     EPINEPHrine 0.3 mg/0.3 mL Soaj injection  Commonly known as:  EPIPEN 2-PAK  Inject 0.3 mLs (0.3 mg total) into the muscle once.     HYDROcodone-acetaminophen 5-325 MG per tablet  Commonly known as:  NORCO/VICODIN  Take 1-2 tablets by mouth every 6 (six) hours as needed (for pain).     HYDROcodone-acetaminophen 5-325 MG per tablet  Commonly known as:  NORCO/VICODIN  Take 1-2 tablets by mouth every 4 (four) hours as needed for moderate pain.     ibuprofen 800 MG tablet  Commonly known as:  ADVIL,MOTRIN  Take 1 tablet (800 mg total) by mouth 3 (three) times daily.     IRON PO  Take 1 tablet by mouth at bedtime.     levothyroxine 100 MCG tablet  Commonly known as:  SYNTHROID, LEVOTHROID  Take 1 tablet (100 mcg total) by mouth  daily before breakfast.     mometasone 50 MCG/ACT nasal spray  Commonly known as:  NASONEX  Place 2 sprays into the nose every morning.     multivitamin with minerals Tabs tablet  Take 1 tablet by mouth every morning.     ondansetron 4 MG tablet  Commonly known as:  ZOFRAN  Take 1 tablet (4 mg total) by mouth every 6 (six) hours as needed for nausea or vomiting.     predniSONE 20 MG tablet  Commonly known as:  DELTASONE  Take 0.5 tablets (10 mg total) by mouth as directed. 5-4-3-2-1         SignedAscencion Dike PA-C 06/13/2014, 1:36 PM

## 2014-06-14 ENCOUNTER — Other Ambulatory Visit (HOSPITAL_COMMUNITY): Payer: Self-pay | Admitting: Interventional Radiology

## 2014-06-14 DIAGNOSIS — D259 Leiomyoma of uterus, unspecified: Secondary | ICD-10-CM

## 2014-06-15 ENCOUNTER — Telehealth: Payer: Self-pay | Admitting: *Deleted

## 2014-06-15 NOTE — Telephone Encounter (Signed)
Pt is post [place]/pt called to say she is having bad cramping and lbp/no fever/frequent urination/lt bleeding/using heating pad/she is only taking her oxycodone not her ibuprofen. I sw Dr HM he said this is normal but the patient has to take both the oxycodone and the ibuprofen 800 mg every 6hrs to get relief.  I then phoned the pt and gave her the instructions and she will start immediately.

## 2014-06-15 NOTE — Telephone Encounter (Signed)
Phoned pt re appt

## 2014-07-05 ENCOUNTER — Ambulatory Visit
Admission: RE | Admit: 2014-07-05 | Discharge: 2014-07-05 | Disposition: A | Discharge status: Home / Self Care | Payer: BLUE CROSS/BLUE SHIELD | Source: Ambulatory Visit | Attending: Interventional Radiology | Admitting: Interventional Radiology

## 2014-07-05 DIAGNOSIS — D259 Leiomyoma of uterus, unspecified: Secondary | ICD-10-CM

## 2014-07-06 NOTE — Progress Notes (Signed)
Chief Complaint: Chief Complaint  Patient presents with  . Follow-up    3 wk follow up [place]    Referring Physician(s): Worley Radermacher  History of Present Illness: Denise Schroeder is a 49 y.o. female with a history of symptomatically uterine fibroids treated by uterine artery embolization on 06/12/2014.  She presents today for her 3 month follow-up evaluation. Mrs. Bruneau is in good spirits today. She reports expected mild spotting and occasional brownish discharge without odor. She is wearing a pantiliner daily. She believes she had her first cycle last month and it was very manageable. Cramping was significantly decreased and bleeding was minimal.  She is now back at work full-time. She continues to experience some mild fatigue but this is getting better daily. She denies fever, chills, abdominal pain, nausea or vomiting. She has no pain or discomfort at the right groin access site. Overall, she is quite happy with her progress thus far. She has no specific concerns or complaints.  Past Medical History  Diagnosis Date  . GERD (gastroesophageal reflux disease)   . Anemia   . Allergy   . Hashimoto thyroiditis   . Goiter   . Obesity   . Uterine fibroid     Past Surgical History  Procedure Laterality Date  . Cholecystectomy      Allergies: Citrus and Latex  Medications: Prior to Admission medications   Medication Sig Start Date End Date Taking? Authorizing Provider  ibuprofen (ADVIL,MOTRIN) 800 MG tablet Take 1 tablet (800 mg total) by mouth 3 (three) times daily. 06/13/14  Yes Ascencion Dike, PA-C  levothyroxine (SYNTHROID, LEVOTHROID) 100 MCG tablet Take 1 tablet (100 mcg total) by mouth daily before breakfast. 08/31/13  Yes Camelia Eng Tysinger, PA-C  CALCIUM PO Take 2 tablets by mouth at bedtime.     Historical Provider, MD  cetirizine (ZYRTEC) 10 MG tablet Take 10 mg by mouth daily as needed for allergies.    Historical Provider, MD  EPINEPHrine (EPIPEN 2-PAK) 0.3  mg/0.3 mL IJ SOAJ injection Inject 0.3 mLs (0.3 mg total) into the muscle once. 03/13/14   Camelia Eng Tysinger, PA-C  HYDROcodone-acetaminophen (NORCO/VICODIN) 5-325 MG per tablet Take 1-2 tablets by mouth every 6 (six) hours as needed (for pain). Patient not taking: Reported on 05/31/2014 05/24/14   Camelia Eng Tysinger, PA-C  HYDROcodone-acetaminophen (NORCO/VICODIN) 5-325 MG per tablet Take 1-2 tablets by mouth every 4 (four) hours as needed for moderate pain. 06/13/14   Ascencion Dike, PA-C  IRON PO Take 1 tablet by mouth at bedtime.     Historical Provider, MD  mometasone (NASONEX) 50 MCG/ACT nasal spray Place 2 sprays into the nose every morning.    Historical Provider, MD  Multiple Vitamin (MULTIVITAMIN WITH MINERALS) TABS Take 1 tablet by mouth every morning.     Historical Provider, MD  ondansetron (ZOFRAN) 4 MG tablet Take 1 tablet (4 mg total) by mouth every 6 (six) hours as needed for nausea or vomiting. 06/13/14   Ascencion Dike, PA-C  predniSONE (DELTASONE) 20 MG tablet Take 0.5 tablets (10 mg total) by mouth as directed. 5-4-3-2-1 Patient not taking: Reported on 07/05/2014 06/13/14   Ascencion Dike, PA-C     Family History  Problem Relation Age of Onset  . Diabetes Mother   . Hypertension Mother   . Asthma Son   . Diabetes Maternal Aunt   . Diabetes Maternal Uncle   . Stroke Neg Hx   . Heart disease Neg Hx   . Hyperlipidemia Neg Hx   .  Colon cancer Neg Hx   . Colon polyps Neg Hx   . Kidney disease Neg Hx   . Esophageal cancer Neg Hx   . Gallbladder disease Neg Hx     History   Social History  . Marital Status: Single    Spouse Name: N/A  . Number of Children: 1  . Years of Education: N/A   Occupational History  . Owner of [employer]    Social History Main Topics  . Smoking status: Never Smoker   . Smokeless tobacco: Never Used  . Alcohol Use: 0.0 oz/week    0 Standard drinks or equivalent per week     Comment: Occassionally  . Drug Use: No  . Sexual Activity: No    Other Topics Concern  . Not on file   Social History Narrative   Review of Systems: A 12 point ROS discussed and pertinent positives are indicated in the HPI above.  All other systems are negative.  Review of Systems  Vital Signs: BP 125/71 mmHg  Pulse 68  Temp(Src) 98.2 F (36.8 C) (Oral)  Resp 14  SpO2 96%  Physical Exam  Constitutional: She is oriented to person, place, and time. She appears well-developed and well-nourished.  HENT:  Head: Normocephalic and atraumatic.  Eyes: No scleral icterus.  Cardiovascular: Normal rate.   Pulmonary/Chest: Effort normal.  Abdominal: Soft. She exhibits no distension. There is no tenderness.  Neurological: She is alert and oriented to person, place, and time.  Skin: Skin is warm and dry.  Psychiatric: She has a normal mood and affect. Her behavior is normal.  Nursing note and vitals reviewed.  Labs:  CBC:  Recent Labs  08/30/13 1116 02/26/14 2145 05/24/14 1644 06/12/14 1215  WBC 4.9 6.7 5.7 5.8  HGB 9.9* 8.6* 9.7* 9.8*  HCT 29.9* 27.6* 29.7* 32.3*  PLT 252 270 232 277    COAGS:  Recent Labs  06/12/14 1215  INR 0.99  APTT 25    BMP:  Recent Labs  08/30/13 1116 02/26/14 2145 05/24/14 1644 06/12/14 1215  NA 137 140 136 138  K 4.3 3.9 3.7 4.3  CL 106 105 107 107  CO2 24 24 19 22   GLUCOSE 76 96 103* 84  BUN 16 17 15 14   CALCIUM 9.2 8.9 8.4 9.1  CREATININE 0.90 1.00 0.81 0.86  GFRNONAA  --  66*  --  78*  GFRAA  --  76*  --  >90    LIVER FUNCTION TESTS:  Recent Labs  08/30/13 1116 05/24/14 1644  BILITOT 0.3 0.3  AST 13 13  ALT 9 11  ALKPHOS 54 50  PROT 6.8 6.6  ALBUMIN 3.8 3.8    TUMOR MARKERS: No results for input(s): AFPTM, CEA, CA199, CHROMGRNA in the last 8760 hours.  Assessment and Plan:  Doing very well 3 weeks status post bilateral uterine artery embolization. Her recovery is progressing very well.  - Our office will call in 3 months for a telephone checkup.   - I will see her  again at 6 months for our final post procedural follow-up evaluation.   -  Signed: [place], [place] 07/06/2014, 6:13 PM   I spent a total of 10 Minutes in face to face in clinical consultation, greater than 50% of which was counseling/coordinating care for post [place].

## 2014-07-09 ENCOUNTER — Telehealth: Payer: Self-pay | Admitting: *Deleted

## 2014-07-09 NOTE — Telephone Encounter (Signed)
Post [place] 06/12/14. Pt called stating Sat and Nancy Fetter she had heavy pressure on rt side of the lower pelvic region and pain, also states she is having bloating and gas pressure. No fever, has had a bowel movement and taking Ibuprofen. [place] and then called the pt back, I told her this could be the coming of her cycle and to get an over the counter medicine for the gas/bloating. I also told her we could get her in to see one of the other Rads this week and she said if she is not better by tomorrow she will call me to be seen.

## 2014-09-05 ENCOUNTER — Telehealth: Payer: Self-pay | Admitting: Radiology

## 2014-09-05 NOTE — Telephone Encounter (Signed)
LMP:  Late May 2016.  Symptoms are improving with decreased flow.  Improvement in abd & pelvic pressure; bloating & back pain.   Prefers to schedule follow up appointment at 6 mo s/p [place].   Cashtyn Pouliot Riki Rusk, RN 09/05/2014 9:57 AM

## 2014-09-18 ENCOUNTER — Telehealth: Payer: Self-pay | Admitting: Family Medicine

## 2014-09-18 NOTE — Telephone Encounter (Signed)
Margo Ward Case Manager for [employer] called and states she is an Therapist, sports for this patient if needed.  No Concerns

## 2014-09-22 ENCOUNTER — Other Ambulatory Visit: Payer: Self-pay | Admitting: Medical

## 2014-09-24 NOTE — Telephone Encounter (Signed)
Needs office visit.

## 2014-09-28 ENCOUNTER — Encounter: Payer: Self-pay | Admitting: Medical

## 2014-09-28 ENCOUNTER — Ambulatory Visit (INDEPENDENT_AMBULATORY_CARE_PROVIDER_SITE_OTHER): Payer: BLUE CROSS/BLUE SHIELD | Admitting: Medical

## 2014-09-28 VITALS — BP 112/80 | HR 72 | Temp 98.6°F | Resp 15 | Wt 275.0 lb

## 2014-09-28 DIAGNOSIS — J0110 Acute frontal sinusitis, unspecified: Secondary | ICD-10-CM | POA: Diagnosis not present

## 2014-09-28 MED ORDER — AMOXICILLIN 875 MG PO TABS: 875.0000 mg | ORAL_TABLET | Freq: Two times a day (BID) | ORAL | Status: DC

## 2014-09-28 MED ORDER — BECLOMETHASONE DIPROPIONATE 80 MCG/ACT NA AERS: 1.0000 | INHALATION_SPRAY | Freq: Two times a day (BID) | NASAL | Status: DC

## 2014-09-28 NOTE — Progress Notes (Signed)
Subjective:  Denise Schroeder is a 49 y.o. female who presents for possible sinus infection.  Symptoms include 1+ week hx/o sinus pressure, ear pressure, head congestion, mild cough at night, nausea, scratchy throat.  Denies fever, NVD, SOB, wheezing, chills.  Patient is a non-smoker.  Using afrin, ibuprofen, sudafed, zyrtec for symptoms.  Denies sick contacts.  No other aggravating or relieving factors.  No other c/o.  ROS as in subjective   Objective: Filed Vitals:   09/28/14 1449  BP: 112/80  Pulse: 72  Temp: 98.6 F (37 C)  Resp: 15    General appearance: Alert, WD/WN, no distress                             Skin: warm, no rash                           Head: +frontal sinus tenderness,                            Eyes: conjunctiva normal, corneas clear, PERRLA                            Ears: pearly TMs, external ear canals normal                          Nose: septum midline, turbinates swollen, with erythema and clear discharge             Mouth/throat: MMM, tongue normal, mild pharyngeal erythema                           Neck: supple, no adenopathy, no thyromegaly, nontender                          Lungs: CTA bilaterally, no wheezes, rales, or rhonchi      Assessment and Plan:   Encounter Diagnosis  Name Primary?  . Acute frontal sinusitis, recurrence not specified Yes     Prescription given for amoxicillin.  Script for Qnasal and sample given.   Advised Afrin tonight and tomorrow and stop as she also begins Qnasal BID for 1-2 wk prn to get off the afrin and help with symptoms.  Can use OTC Mucinex for congestion.  Tylenol or Ibuprofen OTC for fever and malaise.  Discussed symptomatic relief, nasal saline flush, and call or return if worse or not improving in 2-3 days.

## 2014-10-19 ENCOUNTER — Telehealth: Payer: Self-pay | Admitting: Internal Medicine

## 2014-10-19 MED ORDER — FLUCONAZOLE 150 MG PO TABS: 150.0000 mg | ORAL_TABLET | Freq: Once | ORAL | Status: DC

## 2014-10-19 NOTE — Telephone Encounter (Signed)
Pt was seen a couple weeks ago for sinus issues and was given amoxicillin but now as a yeast infection and she wants something sent in to [facility]

## 2014-10-19 NOTE — Telephone Encounter (Signed)
Call her 

## 2014-10-30 ENCOUNTER — Other Ambulatory Visit: Payer: Self-pay | Admitting: Medical

## 2014-11-06 ENCOUNTER — Encounter: Payer: Self-pay | Admitting: Medical

## 2014-11-07 ENCOUNTER — Encounter: Payer: Self-pay | Admitting: Medical

## 2014-11-07 ENCOUNTER — Ambulatory Visit (INDEPENDENT_AMBULATORY_CARE_PROVIDER_SITE_OTHER): Payer: BLUE CROSS/BLUE SHIELD | Admitting: Medical

## 2014-11-07 VITALS — BP 118/78 | HR 72 | Temp 98.0°F | Resp 15 | Ht 70.0 in | Wt 277.1 lb

## 2014-11-07 DIAGNOSIS — D649 Anemia, unspecified: Secondary | ICD-10-CM

## 2014-11-07 DIAGNOSIS — E038 Other specified hypothyroidism: Secondary | ICD-10-CM

## 2014-11-07 DIAGNOSIS — Z23 Encounter for immunization: Secondary | ICD-10-CM | POA: Diagnosis not present

## 2014-11-07 DIAGNOSIS — E669 Obesity, unspecified: Secondary | ICD-10-CM

## 2014-11-07 DIAGNOSIS — R208 Other disturbances of skin sensation: Secondary | ICD-10-CM

## 2014-11-07 DIAGNOSIS — R200 Anesthesia of skin: Secondary | ICD-10-CM

## 2014-11-07 LAB — CBC
HCT: 35.1 % — ABNORMAL LOW (ref 36.0–46.0)
Hemoglobin: 11.4 g/dL — ABNORMAL LOW (ref 12.0–15.0)
MCH: 27.9 pg (ref 26.0–34.0)
MCHC: 32.5 g/dL (ref 30.0–36.0)
MCV: 85.8 fL (ref 78.0–100.0)
MPV: 9.4 fL (ref 8.6–12.4)
Platelets: 246 10*3/uL (ref 150–400)
RBC: 4.09 MIL/uL (ref 3.87–5.11)
RDW: 15.3 % (ref 11.5–15.5)
WBC: 5.7 10*3/uL (ref 4.0–10.5)

## 2014-11-07 LAB — COMPREHENSIVE METABOLIC PANEL
ALT: 11 U/L (ref 6–29)
AST: 16 U/L (ref 10–35)
Albumin: 4 g/dL (ref 3.6–5.1)
Alkaline Phosphatase: 58 U/L (ref 33–115)
BUN: 14 mg/dL (ref 7–25)
CO2: 25 mmol/L (ref 20–31)
CREATININE: 0.76 mg/dL (ref 0.50–1.10)
Calcium: 9.3 mg/dL (ref 8.6–10.2)
Chloride: 104 mmol/L (ref 98–110)
GLUCOSE: 74 mg/dL (ref 65–99)
Potassium: 4.3 mmol/L (ref 3.5–5.3)
SODIUM: 140 mmol/L (ref 135–146)
Total Bilirubin: 0.3 mg/dL (ref 0.2–1.2)
Total Protein: 6.7 g/dL (ref 6.1–8.1)

## 2014-11-07 LAB — LIPID PANEL
CHOL/HDL RATIO: 3.1 ratio (ref <=5.0)
CHOLESTEROL: 176 mg/dL (ref 125–200)
HDL: 56 mg/dL (ref >=46)
LDL Cholesterol: 97 mg/dL (ref <130)
Triglycerides: 113 mg/dL (ref <150)
VLDL: 23 mg/dL (ref <30)

## 2014-11-07 LAB — HEMOGLOBIN A1C
Hgb A1c MFr Bld: 5.3 % (ref <5.7)
Mean Plasma Glucose: 105 mg/dL (ref <117)

## 2014-11-07 LAB — TSH: TSH: 2.271 u[IU]/mL (ref 0.350–4.500)

## 2014-11-07 LAB — RPR

## 2014-11-07 LAB — T4, FREE: FREE T4: 1.06 ng/dL (ref 0.80–1.80)

## 2014-11-07 LAB — VITAMIN B12: Vitamin B-12: 1571 pg/mL — ABNORMAL HIGH (ref 211–911)

## 2014-11-07 NOTE — Progress Notes (Signed)
   Subjective: Chief Complaint  Patient presents with  . Medication Refill    Patient states that she is here to have her TSH checked and recieve a refill on levothyroxine  . Numbness    Patient states that she has some numbness in her face and lips. She states that she notices it more when she uses lip balm or lipstick   Here for recheck and other concerns.  hypothyroidism - compliant with medication without c/o  Anemia - was anemic prior to last visit, had gynecological procedure for uterine bleeding.   Doing fine now  Her main c/o today is right facial numbness.  First noticed this months ago, but it has been persistent in right cheek and upper right lip.  Denies other numbness, tingling, weakness, speech changes, vision or hearing changes, or falls.   No other c/o.  Past Medical History  Diagnosis Date  . GERD (gastroesophageal reflux disease)   . Anemia   . Allergy   . Hashimoto thyroiditis   . Goiter   . Obesity   . Uterine fibroid    ROS as in subjective   Objective: BP 118/78 mmHg  Pulse 72  Temp(Src) 98 F (36.7 C) (Oral)  Resp 15  Ht 5\' 10"  (1.778 m)  Wt 277 lb 2 oz (125.703 kg)  BMI 39.76 kg/m2  LMP 09/27/2014 (Approximate)  General appearance: alert, no distress, WD/WN HEENT: normocephalic, sclerae anicteric, PERRLA, EOMi, nares patent, no discharge or erythema, pharynx normal Oral cavity: MMM, no lesions Neck: supple, no lymphadenopathy, no thyromegaly, no masses Heart: RRR, normal S1, S2, no murmurs Lungs: CTA bilaterally, no wheezes, rhonchi, or rales Extremities: no edema, no cyanosis, no clubbing Pulses: 2+ symmetric, upper and lower extremities, normal cap refill Neurological: decreased sharp sensation to right face beneath orbit along cheek and upper right lip, otherwise alert, oriented x 3, CN2-12 intact, strength normal upper extremities and lower extremities, sensation normal throughout, DTRs 2+ throughout, no cerebellar signs, gait  normal Psychiatric: normal affect, behavior normal, pleasant     Adult ECG Report  Indication: right facial numbness  Rate: 56 bpm  Rhythm: sinus bradycardia  QRS Axis: 41 degrees  PR Interval: 172ms  QRS Duration: 67ms  QTc: 457ms  Conduction Disturbances: none  Other Abnormalities: none  Patient's cardiac risk factors are: obesity (BMI >= 30 kg/m2) and sedentary lifestyle.  EKG comparison: 02/2014  Narrative Interpretation: sinus bradycardia, no acute changes    Assessment: Encounter Diagnoses  Name Primary?  . Right facial numbness Yes  . Obesity   . Other specified hypothyroidism   . Anemia, unspecified anemia type   . Need for diphtheria-tetanus-pertussis (Tdap) vaccine, adult/adolescent   . Flu vaccine need     Plan: Right facial numbness - etiology unclear.  Labs and EKG today, will likely pursue MRI brian  obesity - c/t efforts at weight loss  Hypothyroidism - labs today, c/t same medication  Anemia - should be improved since she had procedure with gynecology for uterine bleeding months ago.   Labs today  Counseled on the Tdap (tetanus, diptheria, and acellular pertussis) vaccine.  Vaccine information sheet given. Tdap vaccine given after consent obtained.  Counseled on the influenza virus vaccine.  Vaccine information sheet given.  Influenza vaccine given after consent obtained.

## 2014-11-08 ENCOUNTER — Telehealth: Payer: Self-pay

## 2014-11-08 ENCOUNTER — Other Ambulatory Visit: Payer: Self-pay | Admitting: Medical

## 2014-11-08 LAB — HIV ANTIBODY (ROUTINE TESTING W REFLEX): HIV 1&2 Ab, 4th Generation: NONREACTIVE

## 2014-11-08 LAB — VITAMIN D 25 HYDROXY (VIT D DEFICIENCY, FRACTURES): Vit D, 25-Hydroxy: 33 ng/mL (ref 30–100)

## 2014-11-08 MED ORDER — LEVOTHYROXINE SODIUM 100 MCG PO TABS: 100.0000 ug | ORAL_TABLET | Freq: Every day | ORAL | Status: DC

## 2014-11-12 NOTE — Telephone Encounter (Signed)
Opened in error

## 2014-12-03 ENCOUNTER — Telehealth: Payer: Self-pay | Admitting: Medical

## 2014-12-03 ENCOUNTER — Ambulatory Visit (INDEPENDENT_AMBULATORY_CARE_PROVIDER_SITE_OTHER): Payer: BLUE CROSS/BLUE SHIELD | Admitting: Medical

## 2014-12-03 ENCOUNTER — Encounter: Payer: Self-pay | Admitting: Medical

## 2014-12-03 VITALS — BP 100/70 | HR 70 | Temp 98.1°F | Resp 18 | Wt 275.4 lb

## 2014-12-03 DIAGNOSIS — R208 Other disturbances of skin sensation: Secondary | ICD-10-CM

## 2014-12-03 DIAGNOSIS — K219 Gastro-esophageal reflux disease without esophagitis: Secondary | ICD-10-CM

## 2014-12-03 DIAGNOSIS — R079 Chest pain, unspecified: Secondary | ICD-10-CM

## 2014-12-03 DIAGNOSIS — R200 Anesthesia of skin: Secondary | ICD-10-CM

## 2014-12-03 DIAGNOSIS — L299 Pruritus, unspecified: Secondary | ICD-10-CM | POA: Diagnosis not present

## 2014-12-03 DIAGNOSIS — J309 Allergic rhinitis, unspecified: Secondary | ICD-10-CM

## 2014-12-03 NOTE — Telephone Encounter (Signed)
Set up MRI brain, RE: Facial numbness

## 2014-12-03 NOTE — Telephone Encounter (Signed)
Pt has to call [facility] to schedule appt as they have to ask her some questions. Pt was given phone # to [facility] [phone]). i have also called pt insurance to get approval for the MRI but they need a provider to provider review as they will not approve. Please call the patient insurance provider review line @ [phone] to speak with a provider to see if this will get approved and get an [place] approval number.  Pt Member ID- [id number] CPT Code- 29476 ICD-10- R20.8

## 2014-12-03 NOTE — Telephone Encounter (Signed)
I am working on this

## 2014-12-03 NOTE — Progress Notes (Signed)
Subjective: Chief Complaint  Patient presents with  . Chest Pain    Possible acid reflux   She notes last week about 5 days had some bad chest pains, got worse in the night with discomfort, chest heaviness, neck fullness.   Took gas x, Pepcid, 2 tbsp vinegar, mustard, and pain persisted all night.   The pain has been consistent the last few days.  Current has pain from chest through to the back.  In the last 48 hours has had heaviness in chest, has had some belching, has burning sensation in neck and with swallowing but feels heaviness on chest.  Today hasn't taken anything.   Yesterday took Pepcid once.   Denies sweats, SOB, nausea, palpations.   No vomiting. No syncope.  Also notes some problems with right ear, feels fullness in ear, and thinks this is causing some dizziness. Denies recent strenuous activity or heavy lifting.  Has been eating a lot of beef lately including last Tuesday night ate beef with Velveeta cheese dip and hot peppers, and Wednesday ate beef roast, and later that day had ice cream and cake.   Despite stopping B12 from last visit , still having facial numbness.  Wants to proceed with MRI as we discussed.  Wants to reviewed labs from last visit.   No other aggravating or relieving factors. No other complaint.   Past Medical History  Diagnosis Date  . GERD (gastroesophageal reflux disease)   . Anemia   . Allergy   . Hashimoto thyroiditis   . Goiter   . Obesity   . Uterine fibroid    ROS as in subjective   Objective: BP 100/70 mmHg  Pulse 70  Temp(Src) 98.1 F (36.7 C)  Resp 18  Wt 275 lb 6.4 oz (124.921 kg)  LMP 09/27/2014 (Approximate)  General appearance: alert, no distress, WD/WN HEENT: normocephalic, sclerae anicteric, TMs pearly, nares patent, no discharge or erythema, pharynx normal Oral cavity: MMM, no lesions Neck: supple, no lymphadenopathy, no thyromegaly, no masses Heart: RRR, normal S1, S2, no murmurs Lungs: CTA bilaterally, no wheezes, rhonchi,  or rales Abdomen: +bs, soft, +epigastric tenderness, otherwise non tender, non distended, no masses, no hepatomegaly, no splenomegaly Pulses: 2+ symmetric, upper and lower extremities, normal cap refill Ext: no edema Neuro: see exam from last visit.   No new focal findings.    Assessment: Encounter Diagnoses  Name Primary?  . Gastroesophageal reflux disease without esophagitis Yes  . Chest pain, unspecified chest pain type   . Right facial numbness   . Ear itch   . Allergic rhinitis, unspecified allergic rhinitis type     Plan: GERD/Chest pain due to GERD - Begin samples of Nexium and GERD trigger avoidance Allergic rhinitis, ear itching - Begin benadryl that she has at home the next few days, then begin OTC Zyrtec QHS daily for the next 1- 47mo Right facial numbness- reviewed recent labs.  Will set up for MRI as etiology unclear.

## 2014-12-03 NOTE — Patient Instructions (Signed)
Recommendations:  Use the Nexium samples  Begin Nexium OTC, 2 capsules NOW and at bedtime  Then starting tomorrow, just take 2 capsules daily in the morning about 30 minutes prior to breakfast/  Take the Nexium daily until you run out  Today, take Benadryl OTC 1 to 2 times today   In general to avoid gastritis and acid reflux:  Limit spicy foods, fast food, fried foods  Limit peppers  Avoid large portions  And don't eat or drink within 2 hours of bedtime

## 2014-12-04 NOTE — Telephone Encounter (Signed)
I called the review line and got it approved.  Approval # [id number] good for 30 days.

## 2014-12-04 NOTE — Telephone Encounter (Signed)
error 

## 2014-12-05 NOTE — Telephone Encounter (Signed)
Pt was notified to call [facility] to get it scheduled as she only has 30 days to get this done. Pt will call today

## 2014-12-19 ENCOUNTER — Ambulatory Visit
Admission: RE | Admit: 2014-12-19 | Discharge: 2014-12-19 | Disposition: A | Discharge status: Home / Self Care | Payer: BLUE CROSS/BLUE SHIELD | Source: Ambulatory Visit | Attending: Medical | Admitting: Medical

## 2014-12-19 DIAGNOSIS — R200 Anesthesia of skin: Secondary | ICD-10-CM

## 2015-06-11 ENCOUNTER — Encounter: Payer: Self-pay | Admitting: Medical

## 2015-06-11 ENCOUNTER — Ambulatory Visit (INDEPENDENT_AMBULATORY_CARE_PROVIDER_SITE_OTHER): Payer: BLUE CROSS/BLUE SHIELD | Admitting: Medical

## 2015-06-11 VITALS — BP 110/70 | HR 78 | Temp 99.0°F | Resp 16 | Wt 274.0 lb

## 2015-06-11 DIAGNOSIS — A084 Viral intestinal infection, unspecified: Secondary | ICD-10-CM | POA: Diagnosis not present

## 2015-06-11 DIAGNOSIS — J309 Allergic rhinitis, unspecified: Secondary | ICD-10-CM

## 2015-06-11 MED ORDER — ONDANSETRON HCL 4 MG PO TABS: 4.0000 mg | ORAL_TABLET | Freq: Three times a day (TID) | ORAL | Status: DC | PRN

## 2015-06-11 MED ORDER — FEXOFENADINE HCL 180 MG PO TABS: 180.0000 mg | ORAL_TABLET | Freq: Every day | ORAL | Status: DC

## 2015-06-11 NOTE — Progress Notes (Signed)
Subjective: Chief Complaint  Patient presents with  . stomach pain    said she ate something over the weekend that didnt agree with her and that her upper stomach area is hurting. has only been eating soup. said it was severe enough she was going to go to the hospital.    Here for f/u on possible food poisoning this past weekend.  This past weekend had nausea, vomiting, upper stomach discomfort.  Was achy, stayed in the bed all day Saturday.   Had break out of sweats on Sunday, felt weak.  Just had soup over Sunday.  Pain eased off a little Sunday but had some headache.   Was using some ibuprofen.  Monday felt a little better.  Still had headache and upper abdominal pain Monday.  She notes only slight abdominal pain in last 24 hours, having limited food intake currently, no NVD or today.   No fever.     Needs something for her allergies.  Having runny nose, itchy eyes, burning eyes, sneezing some, headaches.  Has some cough.  Using nothing for this.  No other aggravating or relieving factors. No other complaint.  Past Medical History  Diagnosis Date  . GERD (gastroesophageal reflux disease)   . Anemia   . Allergy   . Hashimoto thyroiditis   . Goiter   . Obesity   . Uterine fibroid    Past Surgical History  Procedure Laterality Date  . Cholecystectomy      ROS as in subjective   Objective: BP 110/70 mmHg  Pulse 78  Temp(Src) 99 F (37.2 C) (Tympanic)  Resp 16  Wt 274 lb (124.286 kg)  LMP 04/23/2015  General appearance: alert, no distress, WD/WN HEENT: normocephalic, sclerae anicteric, TMs pearly, nares patent, no discharge or erythema, pharynx normal Oral cavity: MMM, no lesions Neck: supple, no lymphadenopathy, no thyromegaly, no masses Heart: RRR, normal S1, S2, no murmurs Lungs: CTA bilaterally, no wheezes, rhonchi, or rales Abdomen: +bs, soft, mild epigastric tenderness, otherwise non tender, non distended, no masses, no hepatomegaly, no splenomegaly Pulses: 2+  symmetric, upper and lower extremities, normal cap refill    Assessment: Encounter Diagnoses  Name Primary?  . Allergic rhinitis, unspecified allergic rhinitis type Yes  . Viral gastroenteritis      Plan: Allergic rhinitis - begin fexofenadine, avoid allergen triggers.   F/u prn  Viral gastroenteritis - resolving.  Reassured  Discussed symptom, supportive care, and call/return if worse, not improving, fever, or blood in stool.    Georgenia was seen today for stomach pain.  Diagnoses and all orders for this visit:  Allergic rhinitis, unspecified allergic rhinitis type  Viral gastroenteritis  Other orders -     fexofenadine (ALLEGRA) 180 MG tablet; Take 1 tablet (180 mg total) by mouth daily. -     ondansetron (ZOFRAN) 4 MG tablet; Take 1 tablet (4 mg total) by mouth every 8 (eight) hours as needed for nausea or vomiting.

## 2015-07-01 ENCOUNTER — Telehealth: Payer: Self-pay | Admitting: Medical

## 2015-07-01 NOTE — Telephone Encounter (Signed)
Pain is not the same as numbness.   If numb (no sensation in face), that is different than pain.  So either have her come in or if similar to the prior "numbness in the face," it may be worth referring to neurology for further eval.

## 2015-07-01 NOTE — Telephone Encounter (Signed)
She said that it is normally numbness but last night it turned to pain. Pt scheduled appt

## 2015-07-01 NOTE — Telephone Encounter (Signed)
Pt called and states that she has been having numbness in her face for the past 3 week and last night the pain was very severe and was down to her jaw and neck, said it she had to put her head down in the pillow and put pressure on it, and after about 20 minutes the pain finally went away but the numbness is still there, said she has had this since last year, she doesn't know if it is coming from allergies, aniexity,or what. Said please advise. Pt can be reached at [phone]

## 2015-08-09 ENCOUNTER — Emergency Department (HOSPITAL_COMMUNITY): Payer: BLUE CROSS/BLUE SHIELD

## 2015-08-09 ENCOUNTER — Encounter (HOSPITAL_COMMUNITY): Payer: Self-pay | Admitting: Emergency Medicine

## 2015-08-09 ENCOUNTER — Observation Stay (HOSPITAL_COMMUNITY): Payer: BLUE CROSS/BLUE SHIELD

## 2015-08-09 ENCOUNTER — Observation Stay (HOSPITAL_COMMUNITY)
Admission: EM | Admit: 2015-08-09 | Discharge: 2015-08-11 | Disposition: A | Discharge status: Home / Self Care | Payer: BLUE CROSS/BLUE SHIELD | Attending: Family Medicine | Admitting: Family Medicine

## 2015-08-09 DIAGNOSIS — R202 Paresthesia of skin: Secondary | ICD-10-CM | POA: Insufficient documentation

## 2015-08-09 DIAGNOSIS — H538 Other visual disturbances: Secondary | ICD-10-CM | POA: Diagnosis not present

## 2015-08-09 DIAGNOSIS — Z7982 Long term (current) use of aspirin: Secondary | ICD-10-CM | POA: Diagnosis not present

## 2015-08-09 DIAGNOSIS — I10 Essential (primary) hypertension: Secondary | ICD-10-CM | POA: Diagnosis not present

## 2015-08-09 DIAGNOSIS — R51 Headache: Principal | ICD-10-CM | POA: Insufficient documentation

## 2015-08-09 DIAGNOSIS — E785 Hyperlipidemia, unspecified: Secondary | ICD-10-CM | POA: Diagnosis not present

## 2015-08-09 DIAGNOSIS — E7800 Pure hypercholesterolemia, unspecified: Secondary | ICD-10-CM | POA: Insufficient documentation

## 2015-08-09 DIAGNOSIS — Z6841 Body Mass Index (BMI) 40.0 and over, adult: Secondary | ICD-10-CM | POA: Diagnosis not present

## 2015-08-09 DIAGNOSIS — I2729 Other secondary pulmonary hypertension: Secondary | ICD-10-CM

## 2015-08-09 DIAGNOSIS — E669 Obesity, unspecified: Secondary | ICD-10-CM | POA: Insufficient documentation

## 2015-08-09 DIAGNOSIS — E039 Hypothyroidism, unspecified: Secondary | ICD-10-CM | POA: Insufficient documentation

## 2015-08-09 DIAGNOSIS — R519 Headache, unspecified: Secondary | ICD-10-CM | POA: Diagnosis present

## 2015-08-09 LAB — URINALYSIS, ROUTINE W REFLEX MICROSCOPIC
Bilirubin Urine: NEGATIVE
GLUCOSE, UA: NEGATIVE mg/dL
KETONES UR: NEGATIVE mg/dL
LEUKOCYTES UA: NEGATIVE
Nitrite: NEGATIVE
PROTEIN: NEGATIVE mg/dL
Specific Gravity, Urine: 1.023 (ref 1.005–1.030)
pH: 6 (ref 5.0–8.0)

## 2015-08-09 LAB — I-STAT CHEM 8, ED
BUN: 12 mg/dL (ref 6–20)
CALCIUM ION: 1.21 mmol/L (ref 1.12–1.23)
CHLORIDE: 105 mmol/L (ref 101–111)
CREATININE: 0.9 mg/dL (ref 0.44–1.00)
Glucose, Bld: 88 mg/dL (ref 65–99)
HCT: 37 % (ref 36.0–46.0)
Hemoglobin: 12.6 g/dL (ref 12.0–15.0)
Potassium: 4 mmol/L (ref 3.5–5.1)
SODIUM: 141 mmol/L (ref 135–145)
TCO2: 24 mmol/L (ref 0–100)

## 2015-08-09 LAB — PROTIME-INR
INR: 1.15 (ref 0.00–1.49)
Prothrombin Time: 14.9 seconds (ref 11.6–15.2)

## 2015-08-09 LAB — COMPREHENSIVE METABOLIC PANEL
ALBUMIN: 3.5 g/dL (ref 3.5–5.0)
ALK PHOS: 65 U/L (ref 38–126)
ALT: 13 U/L — AB (ref 14–54)
AST: 15 U/L (ref 15–41)
Anion gap: 7 (ref 5–15)
BILIRUBIN TOTAL: 0.4 mg/dL (ref 0.3–1.2)
BUN: 11 mg/dL (ref 6–20)
CO2: 24 mmol/L (ref 22–32)
CREATININE: 0.89 mg/dL (ref 0.44–1.00)
Calcium: 9.5 mg/dL (ref 8.9–10.3)
Chloride: 107 mmol/L (ref 101–111)
GFR calc Af Amer: >60 mL/min (ref >60)
GLUCOSE: 92 mg/dL (ref 65–99)
POTASSIUM: 3.9 mmol/L (ref 3.5–5.1)
Sodium: 138 mmol/L (ref 135–145)
TOTAL PROTEIN: 6.8 g/dL (ref 6.5–8.1)

## 2015-08-09 LAB — I-STAT TROPONIN, ED: Troponin i, poc: 0 ng/mL (ref 0.00–0.08)

## 2015-08-09 LAB — RAPID URINE DRUG SCREEN, HOSP PERFORMED
Amphetamines: NOT DETECTED
BARBITURATES: NOT DETECTED
Benzodiazepines: NOT DETECTED
COCAINE: NOT DETECTED
Opiates: NOT DETECTED
TETRAHYDROCANNABINOL: NOT DETECTED

## 2015-08-09 LAB — DIFFERENTIAL
BASOS ABS: 0 10*3/uL (ref 0.0–0.1)
Basophils Relative: 0 %
EOS PCT: 4 %
Eosinophils Absolute: 0.2 10*3/uL (ref 0.0–0.7)
LYMPHS PCT: 30 %
Lymphs Abs: 1.8 10*3/uL (ref 0.7–4.0)
MONOS PCT: 7 %
Monocytes Absolute: 0.4 10*3/uL (ref 0.1–1.0)
Neutro Abs: 3.6 10*3/uL (ref 1.7–7.7)
Neutrophils Relative %: 59 %

## 2015-08-09 LAB — URINE MICROSCOPIC-ADD ON

## 2015-08-09 LAB — CBC
HEMATOCRIT: 36.1 % (ref 36.0–46.0)
HEMOGLOBIN: 11.4 g/dL — AB (ref 12.0–15.0)
MCH: 27.6 pg (ref 26.0–34.0)
MCHC: 31.6 g/dL (ref 30.0–36.0)
MCV: 87.4 fL (ref 78.0–100.0)
Platelets: 241 10*3/uL (ref 150–400)
RBC: 4.13 MIL/uL (ref 3.87–5.11)
RDW: 13.8 % (ref 11.5–15.5)
WBC: 6 10*3/uL (ref 4.0–10.5)

## 2015-08-09 LAB — APTT: APTT: 28 s (ref 24–37)

## 2015-08-09 LAB — ETHANOL: Alcohol, Ethyl (B): <5 mg/dL (ref <5)

## 2015-08-09 MED ORDER — STROKE: EARLY STAGES OF RECOVERY BOOK
Freq: Once | Status: AC
  Administered 2015-08-09: 17:00:00

## 2015-08-09 MED ORDER — LORAZEPAM 2 MG/ML IJ SOLN
1.0000 mg | Freq: Once | INTRAMUSCULAR | Status: AC
  Administered 2015-08-09: 1 mg via INTRAVENOUS
  Filled 2015-08-09: qty 1

## 2015-08-09 MED ORDER — ASPIRIN 325 MG PO TABS
325.0000 mg | ORAL_TABLET | Freq: Every day | ORAL | Status: DC
  Administered 2015-08-09 – 2015-08-11 (×3): 325 mg via ORAL
  Filled 2015-08-09 (×3): qty 1

## 2015-08-09 MED ORDER — METOCLOPRAMIDE HCL 5 MG/ML IJ SOLN
10.0000 mg | Freq: Once | INTRAMUSCULAR | Status: AC
  Administered 2015-08-09: 10 mg via INTRAVENOUS
  Filled 2015-08-09: qty 2

## 2015-08-09 MED ORDER — IOPAMIDOL (ISOVUE-370) INJECTION 76%
INTRAVENOUS | Status: AC
  Administered 2015-08-09: 50 mL
  Filled 2015-08-09: qty 50

## 2015-08-09 MED ORDER — SODIUM CHLORIDE 0.9 % IV BOLUS (SEPSIS)
1000.0000 mL | INTRAVENOUS | Status: AC
Start: 2015-08-09 — End: 2015-08-09
  Administered 2015-08-09: 1000 mL via INTRAVENOUS

## 2015-08-09 MED ORDER — LEVOTHYROXINE SODIUM 100 MCG PO TABS
100.0000 ug | ORAL_TABLET | Freq: Every day | ORAL | Status: DC
  Administered 2015-08-10 – 2015-08-11 (×2): 100 ug via ORAL
  Filled 2015-08-09 (×2): qty 1

## 2015-08-09 MED ORDER — ENOXAPARIN SODIUM 40 MG/0.4ML ~~LOC~~ SOLN
40.0000 mg | SUBCUTANEOUS | Status: DC
  Administered 2015-08-10: 40 mg via SUBCUTANEOUS
  Filled 2015-08-09: qty 0.4

## 2015-08-09 MED ORDER — SODIUM CHLORIDE 0.9 % IV SOLN
INTRAVENOUS | Status: DC
  Administered 2015-08-09: 17:00:00 via INTRAVENOUS

## 2015-08-09 MED ORDER — LORATADINE 10 MG PO TABS
10.0000 mg | ORAL_TABLET | Freq: Every day | ORAL | Status: DC
  Administered 2015-08-09 – 2015-08-11 (×3): 10 mg via ORAL
  Filled 2015-08-09 (×3): qty 1

## 2015-08-09 NOTE — H&P (Signed)
[facility] History and Physical  Denise Schroeder [id number] DOB: 1965-10-08 DOA: 08/09/2015  Referring physician:  PCP: Crisoforo Oxford, PA-C  Specialists:   Chief Complaint: headaches, right sided paresthesia   HPI: Denise Schroeder is a 50 y.o. female with PMH of Anemia, Hypothyroidism, presented with headaches. Patient reports of intermittent tension like occipital, non radiating headaches since yesterday. She also c/o dizziness and blurred vision around 11 AM. Also reported right sided paresthesia which happened last night. Code stroke was called in ED. Per neurology: recommended further eval r/o CVA -currently, patient reports mild residual occipital headaches, denies focal neuro weaknesses or paresthesia to me. No change in speech or vision. Denies chest pains or shortness of breath, no nausea, vomiting or diarrhea   Review of Systems: The patient denies anorexia, fever, weight loss,, vision loss, decreased hearing, hoarseness, chest pain, syncope, dyspnea on exertion, peripheral edema, balance deficits, hemoptysis, abdominal pain, melena, hematochezia, severe indigestion/heartburn, hematuria, incontinence, genital sores, muscle weakness, suspicious skin lesions, transient blindness, difficulty walking, depression, unusual weight change, abnormal bleeding, enlarged lymph nodes, angioedema, and breast masses.    Past Medical History  Diagnosis Date  . GERD (gastroesophageal reflux disease)   . Anemia   . Allergy   . Hashimoto thyroiditis   . Goiter   . Obesity   . Uterine fibroid    Past Surgical History  Procedure Laterality Date  . Cholecystectomy     Social History:  reports that she has never smoked. She has never used smokeless tobacco. She reports that she drinks alcohol. She reports that she does not use illicit drugs. Home;  where does patient live--home, ALF, SNF? and with whom if at home? Yes;  Can patient participate in ADLs?  Allergies  Allergen  Reactions  . Citrus Itching, Swelling and Cough    All fruits cause this reaction  . Latex Hives    Family History  Problem Relation Age of Onset  . Diabetes Mother   . Hypertension Mother   . Asthma Son   . Diabetes Maternal Aunt   . Diabetes Maternal Uncle   . Stroke Neg Hx   . Heart disease Neg Hx   . Hyperlipidemia Neg Hx   . Colon cancer Neg Hx   . Colon polyps Neg Hx   . Kidney disease Neg Hx   . Esophageal cancer Neg Hx   . Gallbladder disease Neg Hx     (be sure to complete)  Prior to Admission medications   Medication Sig Start Date End Date Taking? Authorizing Provider  CALCIUM PO Take 2 tablets by mouth at bedtime.     Historical Provider, MD  cetirizine (ZYRTEC) 10 MG tablet Take 10 mg by mouth daily as needed for allergies. Reported on 06/11/2015    Historical Provider, MD  EPINEPHrine (EPIPEN 2-PAK) 0.3 mg/0.3 mL IJ SOAJ injection Inject 0.3 mLs (0.3 mg total) into the muscle once. Patient not taking: Reported on 06/11/2015 03/13/14   Camelia Eng Tysinger, PA-C  fexofenadine (ALLEGRA) 180 MG tablet Take 1 tablet (180 mg total) by mouth daily. 06/11/15   Camelia Eng Tysinger, PA-C  ibuprofen (ADVIL,MOTRIN) 800 MG tablet Take 1 tablet (800 mg total) by mouth 3 (three) times daily. 06/13/14   Ascencion Dike, PA-C  IRON PO Take 1 tablet by mouth at bedtime.     Historical Provider, MD  levothyroxine (SYNTHROID, LEVOTHROID) 100 MCG tablet Take 1 tablet (100 mcg total) by mouth daily before breakfast. 11/08/14   Carlena Hurl,  PA-C  Multiple Vitamin (MULTIVITAMIN WITH MINERALS) TABS Take 1 tablet by mouth every morning.     Historical Provider, MD  ondansetron (ZOFRAN) 4 MG tablet Take 1 tablet (4 mg total) by mouth every 8 (eight) hours as needed for nausea or vomiting. 06/11/15   Carlena Hurl, PA-C   Physical Exam: Filed Vitals:   08/09/15 1515 08/09/15 1545  BP: 116/67 108/73  Pulse: 60 64  Temp:    Resp: 18 22     General:  Alert, no distress   Eyes: eom-i, perrla    ENT: no oral ulcers   Neck: supple, no JVD  Cardiovascular: s1,s2 rrr  Respiratory: CTA BL   Abdomen: soft, nt, nd   Skin: no rash   Musculoskeletal: no leg edema   Psychiatric: no hallucinations   Neurologic:  CN 2-12 intact, motor 5/5 BL symmetric, sensation is intact   Labs on Admission:  Basic Metabolic Panel:  Recent Labs Lab 08/09/15 1325 08/09/15 1334  NA 138 141  K 3.9 4.0  CL 107 105  CO2 24  --   GLUCOSE 92 88  BUN 11 12  CREATININE 0.89 0.90  CALCIUM 9.5  --    Liver Function Tests:  Recent Labs Lab 08/09/15 1325  AST 15  ALT 13*  ALKPHOS 65  BILITOT 0.4  PROT 6.8  ALBUMIN 3.5   No results for input(s): LIPASE, AMYLASE in the last 168 hours. No results for input(s): AMMONIA in the last 168 hours. CBC:  Recent Labs Lab 08/09/15 1325 08/09/15 1334  WBC 6.0  --   NEUTROABS 3.6  --   HGB 11.4* 12.6  HCT 36.1 37.0  MCV 87.4  --   PLT 241  --    Cardiac Enzymes: No results for input(s): CKTOTAL, CKMB, CKMBINDEX, TROPONINI in the last 168 hours.  BNP (last 3 results) No results for input(s): BNP in the last 8760 hours.  ProBNP (last 3 results) No results for input(s): PROBNP in the last 8760 hours.  CBG: No results for input(s): GLUCAP in the last 168 hours.  Radiological Exams on Admission: Ct Angio Head W/cm &/or Wo Cm  08/09/2015  CLINICAL DATA:  50 year old female with headache and dizziness since yesterday. Subsequent encounter. EXAM: CT ANGIOGRAPHY HEAD AND NECK TECHNIQUE: Multidetector CT imaging of the head and neck was performed using the standard protocol during bolus administration of intravenous contrast. Multiplanar CT image reconstructions and MIPs were obtained to evaluate the vascular anatomy. Carotid stenosis measurements (when applicable) are obtained utilizing NASCET criteria, using the distal internal carotid diameter as the denominator. CONTRAST:  50 cc Isovue 370. COMPARISON:  08/09/2015 head CT.  12/19/2014  brain MR. FINDINGS: CT HEAD Brain: No intracranial hemorrhage. No CT evidence of large acute infarct. No intracranial mass or abnormal enhancement. No hydrocephalus. Incidentally noted is a cavum septum pellucidum. Calvarium and skull base: Mild hyperostosis frontalis interna. Paranasal sinuses: Bubbly opacification left sphenoid sinus with air-fluid level suggesting acute sinusitis. Orbits: Minimal exophthalmos. CTA NECK Aortic arch: 3 vessel arch. Right carotid system: No significant narrowing or regularity. Left carotid system: Mild plaque left carotid bifurcation without significant narrowing. Vertebral arteries:Limited imaging of proximal left vertebral artery secondary to venous reflux. No obvious stenosis. Vertebral arteries are of similar size and small in caliber. Skeleton: No worrisome osseous abnormality. Mild cervical spondylotic changes. Evaluation limited by habitus. Other neck: No mass or inflammation. CTA HEAD Anterior circulation: No significant stenosis cavernous segment of either internal carotid artery. Slight ectasia with  proximal narrowing petrous segment right internal carotid artery. Mild narrowing and irregularity petrous segment left internal carotid artery. Hypoplastic A1 segment left anterior cerebral artery. Mild irregularity and minimal narrowing M1 segment of middle cerebral artery bilaterally. Middle cerebral artery branch vessel narrowing and irregularity bilaterally. Posterior circulation: Narrowed irregular distal vertebral arteries. Markedly attenuated narrowed irregular basilar artery. Fetal contribution to the posterior cerebral arteries. Mild irregularity posterior cerebral arteries without high-grade stenosis. Poor delineation posterior inferior cerebellar arteries. Venous sinuses: Patent.  Venous phase imaging limited. Anatomic variants: As above. Delayed phase: As above. IMPRESSION: CT HEAD No intracranial hemorrhage. No CT evidence of large acute infarct. No intracranial  mass or abnormal enhancement. Bubbly opacification left sphenoid sinus with air-fluid level suggesting acute sinusitis. CTA NECK No significant stenosis of either carotid artery. Limited imaging of proximal left vertebral artery secondary to venous reflux. No obvious stenosis. Mild cervical spondylotic changes. Evaluation limited by patient's habitus. CTA HEAD No significant stenosis cavernous segment of either internal carotid artery. Slight ectasia with proximal narrowing petrous segment right internal carotid artery. Mild narrowing and irregularity petrous segment left internal carotid artery. Hypoplastic A1 segment left anterior cerebral artery. Mild irregularity and minimal narrowing M1 segment of middle cerebral artery bilaterally. Middle cerebral artery branch vessel narrowing and irregularity bilaterally. Narrowed irregular distal vertebral arteries. Markedly attenuated narrowed irregular basilar artery. Fetal contribution to the posterior cerebral arteries. Mild irregularity posterior cerebral arteries without high-grade stenosis. Poor delineation posterior inferior cerebellar arteries. Electronically Signed   By: Genia Del M.D.   On: 08/09/2015 14:56   Ct Head Wo Contrast  08/09/2015  CLINICAL DATA:  Code stroke, headache, present since yesterday, dizziness EXAM: CT HEAD WITHOUT CONTRAST TECHNIQUE: Contiguous axial images were obtained from the base of the skull through the vertex without intravenous contrast. COMPARISON:  MR brain 12/19/2014 FINDINGS: There is no evidence of mass effect, midline shift or extra-axial fluid collections. There is no evidence of a space-occupying lesion or intracranial hemorrhage. There is no evidence of a cortical-based area of acute infarction. There is mild generalized cerebral atrophy. The ventricles and sulci are appropriate for the patient's age. The basal cisterns are patent. Visualized portions of the orbits are unremarkable. Small air-fluid level in the left  sphenoid sinus. The osseous structures are unremarkable. IMPRESSION: No acute intracranial pathology. Electronically Signed   By: Kathreen Devoid   On: 08/09/2015 13:43   Ct Angio Neck W/cm &/or Wo/cm  08/09/2015  CLINICAL DATA:  51 year old female with headache and dizziness since yesterday. Subsequent encounter. EXAM: CT ANGIOGRAPHY HEAD AND NECK TECHNIQUE: Multidetector CT imaging of the head and neck was performed using the standard protocol during bolus administration of intravenous contrast. Multiplanar CT image reconstructions and MIPs were obtained to evaluate the vascular anatomy. Carotid stenosis measurements (when applicable) are obtained utilizing NASCET criteria, using the distal internal carotid diameter as the denominator. CONTRAST:  50 cc Isovue 370. COMPARISON:  08/09/2015 head CT.  12/19/2014 brain MR. FINDINGS: CT HEAD Brain: No intracranial hemorrhage. No CT evidence of large acute infarct. No intracranial mass or abnormal enhancement. No hydrocephalus. Incidentally noted is a cavum septum pellucidum. Calvarium and skull base: Mild hyperostosis frontalis interna. Paranasal sinuses: Bubbly opacification left sphenoid sinus with air-fluid level suggesting acute sinusitis. Orbits: Minimal exophthalmos. CTA NECK Aortic arch: 3 vessel arch. Right carotid system: No significant narrowing or regularity. Left carotid system: Mild plaque left carotid bifurcation without significant narrowing. Vertebral arteries:Limited imaging of proximal left vertebral artery secondary to venous reflux. No obvious  stenosis. Vertebral arteries are of similar size and small in caliber. Skeleton: No worrisome osseous abnormality. Mild cervical spondylotic changes. Evaluation limited by habitus. Other neck: No mass or inflammation. CTA HEAD Anterior circulation: No significant stenosis cavernous segment of either internal carotid artery. Slight ectasia with proximal narrowing petrous segment right internal carotid artery.  Mild narrowing and irregularity petrous segment left internal carotid artery. Hypoplastic A1 segment left anterior cerebral artery. Mild irregularity and minimal narrowing M1 segment of middle cerebral artery bilaterally. Middle cerebral artery branch vessel narrowing and irregularity bilaterally. Posterior circulation: Narrowed irregular distal vertebral arteries. Markedly attenuated narrowed irregular basilar artery. Fetal contribution to the posterior cerebral arteries. Mild irregularity posterior cerebral arteries without high-grade stenosis. Poor delineation posterior inferior cerebellar arteries. Venous sinuses: Patent.  Venous phase imaging limited. Anatomic variants: As above. Delayed phase: As above. IMPRESSION: CT HEAD No intracranial hemorrhage. No CT evidence of large acute infarct. No intracranial mass or abnormal enhancement. Bubbly opacification left sphenoid sinus with air-fluid level suggesting acute sinusitis. CTA NECK No significant stenosis of either carotid artery. Limited imaging of proximal left vertebral artery secondary to venous reflux. No obvious stenosis. Mild cervical spondylotic changes. Evaluation limited by patient's habitus. CTA HEAD No significant stenosis cavernous segment of either internal carotid artery. Slight ectasia with proximal narrowing petrous segment right internal carotid artery. Mild narrowing and irregularity petrous segment left internal carotid artery. Hypoplastic A1 segment left anterior cerebral artery. Mild irregularity and minimal narrowing M1 segment of middle cerebral artery bilaterally. Middle cerebral artery branch vessel narrowing and irregularity bilaterally. Narrowed irregular distal vertebral arteries. Markedly attenuated narrowed irregular basilar artery. Fetal contribution to the posterior cerebral arteries. Mild irregularity posterior cerebral arteries without high-grade stenosis. Poor delineation posterior inferior cerebellar arteries. Electronically  Signed   By: Genia Del M.D.   On: 08/09/2015 14:56    EKG: Independently reviewed.   Assessment/Plan Active Problems:   Headache   50 y.o. female with PMH of Anemia, Hypothyroidism, Uterine fibroids, presented with headaches, dizziness and blurred vision and paresthesia   Headaches, dizziness and blurred vision and paresthesia. Probable complicated migraine. Unlikely stroke/tia. CT head: no acute findings. Neuro exam is non focal. Still has mild headaches, but neuro focal neuro symptoms have resolved  -start ASA, we will obtain TIA work up [employer] brain, ha1c, lipid profile, echo, already ordered cta head/neck in ED per neurology. Cont pain control.   Hypothyroidism. Cont levothyroxine.   Neurology.  if consultant consulted, please document name and whether formally or informally consulted  Code Status: full (must indicate code status--if unknown or must be presumed, indicate so) Family Communication: d/w patient, her son (indicate person spoken with, if applicable, with phone number if by telephone) Disposition Plan: home 24-48 hrs  (indicate anticipated LOS)  Time spent: >45 minutes   Kinnie Feil [facility] Pager [phone]  If 7PM-7AM, please contact night-coverage [email or web address] Password [email or web address] 08/09/2015, 4:06 PM

## 2015-08-09 NOTE — ED Notes (Signed)
Per [facility], pt intial LSN was 11am this morning, however after further inquiry, pt LSN was yesterday at 2200. Pt symptoms are headache, dizziness, and blured vision. Per ems pt also had poor coordination on L side. Pt also c/o numbness on RIGHT side of face. Pt AAOX4.

## 2015-08-09 NOTE — ED Notes (Signed)
Notified MD Ralene Bathe that pt. Is unable to void on bedpan at this time. Pt. Also on menstrual cycle so questionable whether able to obtain clean catch. MD ok with not cathing pt. At this time.

## 2015-08-09 NOTE — Code Documentation (Signed)
50yo female arriving to [facility] via [facility] at 13.  Patient with sudden onset dizziness, headache and blurred vision at 1100 today.  EMS assessed left side to be uncoordinated and activated a code stroke.  Labs drawn on arrival and patient cleared for CT by Dr. Ralene Bathe. Stroke team to the bedside.  Patient to CT 2.  CT completed.  Patient to D35.  NIHSS 3, see documentation for details and code stroke times.  Patient with left arm and leg ataxia and decreased sensation in the right face and arm.  Patient reporting that headache started yesterday as well as right arm numbness which has not resolved.  LKW now yesterday at 2200.  Patient is outside the window for treatment with tPA.  Patient to have CTA completed per Dr. Leonel Ramsay.  Bedside handoff with ED RN Martie Round.

## 2015-08-09 NOTE — ED Provider Notes (Signed)
CSN: [id number]     Arrival date & time 08/09/15  1323 History   First MD Initiated Contact with Patient 08/09/15 1324     Chief Complaint  Patient presents with  . Code Stroke    The history is provided by the patient. No language interpreter was used.   Denise Schroeder is a 50 y.o. female who presents to the Emergency Department complaining of headache.  She presents by EMS for headache and dizziness. Code stroke was activated by EMS prior to ED arrival. Last seen normal was at 11 AM. Around 11 she began to develop a left sided and posterior headache. The headache suddenly worsened about noon and she has associated dizziness and blurred vision. No prior similar symptoms. No fevers, vomiting, numbness, weakness.  Past Medical History  Diagnosis Date  . GERD (gastroesophageal reflux disease)   . Anemia   . Allergy   . Hashimoto thyroiditis   . Goiter   . Obesity   . Uterine fibroid    Past Surgical History  Procedure Laterality Date  . Cholecystectomy     Family History  Problem Relation Age of Onset  . Diabetes Mother   . Hypertension Mother   . Asthma Son   . Diabetes Maternal Aunt   . Diabetes Maternal Uncle   . Stroke Neg Hx   . Heart disease Neg Hx   . Hyperlipidemia Neg Hx   . Colon cancer Neg Hx   . Colon polyps Neg Hx   . Kidney disease Neg Hx   . Esophageal cancer Neg Hx   . Gallbladder disease Neg Hx    Social History  Substance Use Topics  . Smoking status: Never Smoker   . Smokeless tobacco: Never Used  . Alcohol Use: 0.0 oz/week    0 Standard drinks or equivalent per week     Comment: Occassionally   OB History    No data available     Review of Systems  All other systems reviewed and are negative.     Allergies  Citrus and Latex  Home Medications   Prior to Admission medications   Medication Sig Start Date End Date Taking? Authorizing Provider  CALCIUM PO Take 2 tablets by mouth at bedtime.    Yes Historical Provider, MD  EPINEPHrine  (EPIPEN 2-PAK) 0.3 mg/0.3 mL IJ SOAJ injection Inject 0.3 mLs (0.3 mg total) into the muscle once. 03/13/14  Yes Camelia Eng Tysinger, PA-C  fexofenadine (ALLEGRA) 180 MG tablet Take 1 tablet (180 mg total) by mouth daily. 06/11/15  Yes Camelia Eng Tysinger, PA-C  ibuprofen (ADVIL,MOTRIN) 800 MG tablet Take 1 tablet (800 mg total) by mouth 3 (three) times daily. Patient taking differently: Take 800 mg by mouth 2 (two) times daily as needed for moderate pain.  06/13/14  Yes Ascencion Dike, PA-C  IRON PO Take 1 tablet by mouth at bedtime as needed (for low iron leves).    Yes Historical Provider, MD  levothyroxine (SYNTHROID, LEVOTHROID) 100 MCG tablet Take 1 tablet (100 mcg total) by mouth daily before breakfast. 11/08/14  Yes Camelia Eng Tysinger, PA-C  Multiple Vitamin (MULTIVITAMIN WITH MINERALS) TABS Take 1 tablet by mouth every morning.    Yes Historical Provider, MD  ondansetron (ZOFRAN) 4 MG tablet Take 1 tablet (4 mg total) by mouth every 8 (eight) hours as needed for nausea or vomiting. 06/11/15  Yes Camelia Eng Tysinger, PA-C  terbinafine (LAMISIL) 250 MG tablet Take 250 mg by mouth daily. 07/22/15  Yes Historical  Provider, MD   BP 100/57 mmHg  Pulse 67  Temp(Src) 97.2 F (36.2 C) (Oral)  Resp 15  Ht 5\' 9"  (1.753 m)  Wt 277 lb 5.4 oz (125.8 kg)  BMI 40.94 kg/m2  SpO2 97%  LMP 08/09/2015 Physical Exam  Constitutional: She is oriented to person, place, and time. She appears well-developed and well-nourished.  HENT:  Head: Normocephalic and atraumatic.  Eyes: Pupils are equal, round, and reactive to light.  Cardiovascular: Normal rate and regular rhythm.   No murmur heard. Pulmonary/Chest: Effort normal and breath sounds normal. No respiratory distress.  Abdominal: Soft. There is no tenderness. There is no rebound and no guarding.  Musculoskeletal: She exhibits no edema or tenderness.  Neurological: She is alert and oriented to person, place, and time.  5 out of 5 strength in all 4 extremities,  sensation to light touch intact in all 4 extremities. Ataxia with FTN with the left upper extremity. No facial asymmetry.  Skin: Skin is warm and dry.  Psychiatric: She has a normal mood and affect. Her behavior is normal.  Nursing note and vitals reviewed.   ED Course  Procedures (including critical care time) Labs Review Labs Reviewed  CBC - Abnormal; Notable for the following:    Hemoglobin 11.4 (*)    All other components within normal limits  COMPREHENSIVE METABOLIC PANEL - Abnormal; Notable for the following:    ALT 13 (*)    All other components within normal limits  URINALYSIS, ROUTINE W REFLEX MICROSCOPIC (NOT AT [facility]) - Abnormal; Notable for the following:    Color, Urine AMBER (*)    Hgb urine dipstick LARGE (*)    All other components within normal limits  URINE MICROSCOPIC-ADD ON - Abnormal; Notable for the following:    Squamous Epithelial / LPF 0-5 (*)    Bacteria, UA FEW (*)    All other components within normal limits  LIPID PANEL - Abnormal; Notable for the following:    LDL Cholesterol 114 (*)    All other components within normal limits  ETHANOL  PROTIME-INR  APTT  DIFFERENTIAL  URINE RAPID DRUG SCREEN, HOSP PERFORMED  HEMOGLOBIN A1C  I-STAT CHEM 8, ED  I-STAT TROPOININ, ED    Imaging Review Ct Angio Head W/cm &/or Wo Cm  08/09/2015  CLINICAL DATA:  50 year old female with headache and dizziness since yesterday. Subsequent encounter. EXAM: CT ANGIOGRAPHY HEAD AND NECK TECHNIQUE: Multidetector CT imaging of the head and neck was performed using the standard protocol during bolus administration of intravenous contrast. Multiplanar CT image reconstructions and MIPs were obtained to evaluate the vascular anatomy. Carotid stenosis measurements (when applicable) are obtained utilizing NASCET criteria, using the distal internal carotid diameter as the denominator. CONTRAST:  50 cc Isovue 370. COMPARISON:  08/09/2015 head CT.  12/19/2014 brain MR. FINDINGS: CT HEAD  Brain: No intracranial hemorrhage. No CT evidence of large acute infarct. No intracranial mass or abnormal enhancement. No hydrocephalus. Incidentally noted is a cavum septum pellucidum. Calvarium and skull base: Mild hyperostosis frontalis interna. Paranasal sinuses: Bubbly opacification left sphenoid sinus with air-fluid level suggesting acute sinusitis. Orbits: Minimal exophthalmos. CTA NECK Aortic arch: 3 vessel arch. Right carotid system: No significant narrowing or regularity. Left carotid system: Mild plaque left carotid bifurcation without significant narrowing. Vertebral arteries:Limited imaging of proximal left vertebral artery secondary to venous reflux. No obvious stenosis. Vertebral arteries are of similar size and small in caliber. Skeleton: No worrisome osseous abnormality. Mild cervical spondylotic changes. Evaluation limited by habitus. Other  neck: No mass or inflammation. CTA HEAD Anterior circulation: No significant stenosis cavernous segment of either internal carotid artery. Slight ectasia with proximal narrowing petrous segment right internal carotid artery. Mild narrowing and irregularity petrous segment left internal carotid artery. Hypoplastic A1 segment left anterior cerebral artery. Mild irregularity and minimal narrowing M1 segment of middle cerebral artery bilaterally. Middle cerebral artery branch vessel narrowing and irregularity bilaterally. Posterior circulation: Narrowed irregular distal vertebral arteries. Markedly attenuated narrowed irregular basilar artery. Fetal contribution to the posterior cerebral arteries. Mild irregularity posterior cerebral arteries without high-grade stenosis. Poor delineation posterior inferior cerebellar arteries. Venous sinuses: Patent.  Venous phase imaging limited. Anatomic variants: As above. Delayed phase: As above. IMPRESSION: CT HEAD No intracranial hemorrhage. No CT evidence of large acute infarct. No intracranial mass or abnormal enhancement.  Bubbly opacification left sphenoid sinus with air-fluid level suggesting acute sinusitis. CTA NECK No significant stenosis of either carotid artery. Limited imaging of proximal left vertebral artery secondary to venous reflux. No obvious stenosis. Mild cervical spondylotic changes. Evaluation limited by patient's habitus. CTA HEAD No significant stenosis cavernous segment of either internal carotid artery. Slight ectasia with proximal narrowing petrous segment right internal carotid artery. Mild narrowing and irregularity petrous segment left internal carotid artery. Hypoplastic A1 segment left anterior cerebral artery. Mild irregularity and minimal narrowing M1 segment of middle cerebral artery bilaterally. Middle cerebral artery branch vessel narrowing and irregularity bilaterally. Narrowed irregular distal vertebral arteries. Markedly attenuated narrowed irregular basilar artery. Fetal contribution to the posterior cerebral arteries. Mild irregularity posterior cerebral arteries without high-grade stenosis. Poor delineation posterior inferior cerebellar arteries. Electronically Signed   By: Genia Del M.D.   On: 08/09/2015 14:56   Ct Head Wo Contrast  08/09/2015  CLINICAL DATA:  Code stroke, headache, present since yesterday, dizziness EXAM: CT HEAD WITHOUT CONTRAST TECHNIQUE: Contiguous axial images were obtained from the base of the skull through the vertex without intravenous contrast. COMPARISON:  MR brain 12/19/2014 FINDINGS: There is no evidence of mass effect, midline shift or extra-axial fluid collections. There is no evidence of a space-occupying lesion or intracranial hemorrhage. There is no evidence of a cortical-based area of acute infarction. There is mild generalized cerebral atrophy. The ventricles and sulci are appropriate for the patient's age. The basal cisterns are patent. Visualized portions of the orbits are unremarkable. Small air-fluid level in the left sphenoid sinus. The osseous  structures are unremarkable. IMPRESSION: No acute intracranial pathology. Electronically Signed   By: Kathreen Devoid   On: 08/09/2015 13:43   Ct Angio Neck W/cm &/or Wo/cm  08/09/2015  CLINICAL DATA:  50 year old female with headache and dizziness since yesterday. Subsequent encounter. EXAM: CT ANGIOGRAPHY HEAD AND NECK TECHNIQUE: Multidetector CT imaging of the head and neck was performed using the standard protocol during bolus administration of intravenous contrast. Multiplanar CT image reconstructions and MIPs were obtained to evaluate the vascular anatomy. Carotid stenosis measurements (when applicable) are obtained utilizing NASCET criteria, using the distal internal carotid diameter as the denominator. CONTRAST:  50 cc Isovue 370. COMPARISON:  08/09/2015 head CT.  12/19/2014 brain MR. FINDINGS: CT HEAD Brain: No intracranial hemorrhage. No CT evidence of large acute infarct. No intracranial mass or abnormal enhancement. No hydrocephalus. Incidentally noted is a cavum septum pellucidum. Calvarium and skull base: Mild hyperostosis frontalis interna. Paranasal sinuses: Bubbly opacification left sphenoid sinus with air-fluid level suggesting acute sinusitis. Orbits: Minimal exophthalmos. CTA NECK Aortic arch: 3 vessel arch. Right carotid system: No significant narrowing or regularity. Left carotid system:  Mild plaque left carotid bifurcation without significant narrowing. Vertebral arteries:Limited imaging of proximal left vertebral artery secondary to venous reflux. No obvious stenosis. Vertebral arteries are of similar size and small in caliber. Skeleton: No worrisome osseous abnormality. Mild cervical spondylotic changes. Evaluation limited by habitus. Other neck: No mass or inflammation. CTA HEAD Anterior circulation: No significant stenosis cavernous segment of either internal carotid artery. Slight ectasia with proximal narrowing petrous segment right internal carotid artery. Mild narrowing and  irregularity petrous segment left internal carotid artery. Hypoplastic A1 segment left anterior cerebral artery. Mild irregularity and minimal narrowing M1 segment of middle cerebral artery bilaterally. Middle cerebral artery branch vessel narrowing and irregularity bilaterally. Posterior circulation: Narrowed irregular distal vertebral arteries. Markedly attenuated narrowed irregular basilar artery. Fetal contribution to the posterior cerebral arteries. Mild irregularity posterior cerebral arteries without high-grade stenosis. Poor delineation posterior inferior cerebellar arteries. Venous sinuses: Patent.  Venous phase imaging limited. Anatomic variants: As above. Delayed phase: As above. IMPRESSION: CT HEAD No intracranial hemorrhage. No CT evidence of large acute infarct. No intracranial mass or abnormal enhancement. Bubbly opacification left sphenoid sinus with air-fluid level suggesting acute sinusitis. CTA NECK No significant stenosis of either carotid artery. Limited imaging of proximal left vertebral artery secondary to venous reflux. No obvious stenosis. Mild cervical spondylotic changes. Evaluation limited by patient's habitus. CTA HEAD No significant stenosis cavernous segment of either internal carotid artery. Slight ectasia with proximal narrowing petrous segment right internal carotid artery. Mild narrowing and irregularity petrous segment left internal carotid artery. Hypoplastic A1 segment left anterior cerebral artery. Mild irregularity and minimal narrowing M1 segment of middle cerebral artery bilaterally. Middle cerebral artery branch vessel narrowing and irregularity bilaterally. Narrowed irregular distal vertebral arteries. Markedly attenuated narrowed irregular basilar artery. Fetal contribution to the posterior cerebral arteries. Mild irregularity posterior cerebral arteries without high-grade stenosis. Poor delineation posterior inferior cerebellar arteries. Electronically Signed   By:  Genia Del M.D.   On: 08/09/2015 14:56   Mr Brain Wo Contrast  08/09/2015  CLINICAL DATA:  50 year old female with headaches. Dizziness and blurred vision. Right-sided paresthesias. Subsequent encounter. EXAM: MRI HEAD WITHOUT CONTRAST TECHNIQUE: Multiplanar, multiecho pulse sequences of the brain and surrounding structures were obtained without intravenous contrast. COMPARISON:  CT angiogram 08/09/2015.  Brain MR 12/19/2014. FINDINGS: No acute infarct or intracranial hemorrhage. No intracranial mass lesion noted on this unenhanced exam. Small caliber patent vertebral arteries and basilar artery. Appearance without change. No hydrocephalus. Patent cavum septum pellucidum incidentally noted. Air-fluid level left sphenoid sinus suggestive of acute sinusitis. Cervical medullary junction, pituitary region, pineal region and orbital structures unremarkable. IMPRESSION: No acute infarct. Left sphenoid sinus air-fluid level suggestive of acute sinusitis. Electronically Signed   By: Genia Del M.D.   On: 08/09/2015 22:03   I have personally reviewed and evaluated these images and lab results as part of my medical decision-making.   EKG Interpretation   Date/Time:  Friday Aug 09 2015 13:46:57 EDT Ventricular Rate:  75 PR Interval:  154 QRS Duration: 94 QT Interval:  389 QTC Calculation: 434 R Axis:   78 Text Interpretation:  Sinus rhythm Confirmed by Hazle Coca [id number]) on  08/09/2015 2:56:29 PM      MDM   Final diagnoses:  Paresthesia      Patient here for evaluation of headache, blurred vision, dizziness. She presented as a code stroke to the emergency department. She has been evaluated by neurology with CT head and CTA head and neck. Plan to admit this patient is high  risk for CVA. Hospitalist consulted for admission.  Quintella Reichert, MD 08/10/15 0700

## 2015-08-09 NOTE — Consult Note (Signed)
Neurology Consultation Reason for Consult: Dizziness Referring Physician: Ralene Bathe, E  CC: Dizziness  History is obtained from:patient   HPI: Denise Schroeder is a 50 y.o. female with headache since yesterday which she describes as located over her left occipital area. Last night she noticed her right arm go numb, but did not think much of it. Today, she had sudden worsening of headache while she was driving and at the same time noticed significant "dizziness." Her description this sounds vertiginous to me. She was brought in as a code stroke, but was not given TPA due to the numbness that started last night. She was seen to have ataxia on the left.  CT angiogram was performed to rule out intervenable lesions and shows a very diminutive posterior circulation, but I suspect that this is chronic with a left vertebral ending in PICA and bilateral fetal PCAs    LKW: 5/25 10 PM tpa given?: no, outside of window    ROS: A 14 point ROS was performed and is negative except as noted in the HPI.   Past Medical History  Diagnosis Date  . GERD (gastroesophageal reflux disease)   . Anemia   . Allergy   . Hashimoto thyroiditis   . Goiter   . Obesity   . Uterine fibroid      Family History  Problem Relation Age of Onset  . Diabetes Mother   . Hypertension Mother   . Asthma Son   . Diabetes Maternal Aunt   . Diabetes Maternal Uncle   . Stroke Neg Hx   . Heart disease Neg Hx   . Hyperlipidemia Neg Hx   . Colon cancer Neg Hx   . Colon polyps Neg Hx   . Kidney disease Neg Hx   . Esophageal cancer Neg Hx   . Gallbladder disease Neg Hx      Social History:  reports that she has never smoked. She has never used smokeless tobacco. She reports that she drinks alcohol. She reports that she does not use illicit drugs.   Exam: Current vital signs: BP 129/65 mmHg  Pulse 64  Temp(Src) 98.1 F (36.7 C) (Oral)  Resp 18  Ht 5\' 9"  (1.753 m)  Wt 125.8 kg (277 lb 5.4 oz)  BMI 40.94 kg/m2   SpO2 100%  LMP 08/09/2015 Vital signs in last 24 hours: Temp:  [98.1 F (36.7 C)] 98.1 F (36.7 C) (05/26 1341) Pulse Rate:  [64] 64 (05/26 1341) Resp:  [18] 18 (05/26 1341) BP: (129)/(65) 129/65 mmHg (05/26 1341) SpO2:  [100 %] 100 % (05/26 1341) Weight:  [125.8 kg (277 lb 5.4 oz)] 125.8 kg (277 lb 5.4 oz) (05/26 1341)   Physical Exam  Constitutional: Appears well-developed and well-nourished.  Psych: Affect appropriate to situation Eyes: No scleral injection HENT: No OP obstrucion Head: Normocephalic.  Cardiovascular: Normal rate and regular rhythm.  Respiratory: Effort normal and breath sounds normal to anterior ascultation GI: Soft.  No distension. There is no tenderness.  Skin: WDI  Neuro: Mental Status: Patient is awake, alert, oriented to person, place, month, year, and situation. Patient is able to give a clear and coherent history. No signs of aphasia or neglect Cranial Nerves: II: Visual Fields are full. Pupils are equal, round, and reactive to light.   III,IV, VI: EOMI without ptosis or diploplia.  V: Facial sensation is decreased on the right to pinprick  VII: Facial movement is symmetric.  VIII: hearing is intact to voice X: Uvula elevates symmetrically XI: Shoulder  shrug is symmetric. XII: tongue is midline without atrophy or fasciculations.  Motor: Tone is normal. Bulk is normal. 5/5 strength was present in all four extremities.  Sensory: Sensation is decreased throughout the right side  Cerebellar: FNF and HKS are intact on the right, ataxic on the left     I have reviewed labs in epic and the results pertinent to this consultation are: Chem 8-unremarkable   I have reviewed the images obtained: CT angiogram-no acute stroke or hemorrhage, very small posterior circulation  Impression: 50 year old female with what I suspect is likely a small brainstem stroke that either enlarged or that she had 2 separate events. I would favor keeping her well hydrated  with fluids with frequent neuro checks. I suspect she may have some degree of atherosclerotic disease in the posterior circulation which is unable to support this given the small size of her circulation.  Recommendations: 1. HgbA1c, fasting lipid panel 2. MRI of the brain without contrast 3. Frequent neuro checks 4. Echocardiogram 5. I will give normal saline 1 L bolus, followed by normal saline at 100 mL per hour 6. Prophylactic therapy-Antiplatelet med: Aspirin - dose 325mg  PO or 300mg  PR 7. Risk factor modification 8. Telemetry monitoring 9. PT consult, OT consult, Speech consult 10. please page stroke NP  Or  PA  Or MD  M-F from 8am -4 pm starting 5/27 as this patient will be followed by the stroke team at this point.   You can look them up on [email or web address]       Roland Rack, MD [facility] [phone]  If 7pm- 7am, please page neurology on call as listed in [facility].

## 2015-08-09 NOTE — Progress Notes (Signed)
Patient arrived from ED to 5M08 alert and pleasant at this time. Safety precautions and orders reviewed with patient. Stroke booklet given and reviewed. Call light within reach. MD paged. TELE applied and confirmed. No other distress. Will continue to monitor.   Ave Filter, RN

## 2015-08-10 ENCOUNTER — Observation Stay (HOSPITAL_BASED_OUTPATIENT_CLINIC_OR_DEPARTMENT_OTHER): Payer: BLUE CROSS/BLUE SHIELD

## 2015-08-10 ENCOUNTER — Observation Stay (HOSPITAL_COMMUNITY): Payer: BLUE CROSS/BLUE SHIELD

## 2015-08-10 DIAGNOSIS — E785 Hyperlipidemia, unspecified: Secondary | ICD-10-CM

## 2015-08-10 DIAGNOSIS — R51 Headache: Secondary | ICD-10-CM | POA: Diagnosis not present

## 2015-08-10 DIAGNOSIS — R202 Paresthesia of skin: Secondary | ICD-10-CM | POA: Diagnosis not present

## 2015-08-10 DIAGNOSIS — G459 Transient cerebral ischemic attack, unspecified: Secondary | ICD-10-CM | POA: Diagnosis not present

## 2015-08-10 LAB — ECHOCARDIOGRAM COMPLETE
HEIGHTINCHES: 69 in
Weight: 4437.42 oz

## 2015-08-10 LAB — LIPID PANEL
Cholesterol: 176 mg/dL (ref 0–200)
HDL: 49 mg/dL (ref >40)
LDL CALC: 114 mg/dL — AB (ref 0–99)
TRIGLYCERIDES: 66 mg/dL (ref <150)
Total CHOL/HDL Ratio: 3.6 RATIO
VLDL: 13 mg/dL (ref 0–40)

## 2015-08-10 MED ORDER — ZOLPIDEM TARTRATE 5 MG PO TABS
5.0000 mg | ORAL_TABLET | Freq: Once | ORAL | Status: AC
  Administered 2015-08-10: 5 mg via ORAL
  Filled 2015-08-10: qty 1

## 2015-08-10 MED ORDER — ATORVASTATIN CALCIUM 40 MG PO TABS
40.0000 mg | ORAL_TABLET | Freq: Every day | ORAL | Status: DC
  Administered 2015-08-10: 40 mg via ORAL
  Filled 2015-08-10: qty 1

## 2015-08-10 NOTE — Evaluation (Signed)
Speech Language Pathology Evaluation Patient Details Name: Denise Schroeder MRN: [id number] DOB: 08/20/1965 Today's Date: 08/10/2015 Time: [id number] SLP Time Calculation (min) (ACUTE ONLY): 10 min  Problem List:  Patient Active Problem List   Diagnosis Date Noted  . Headache 08/09/2015  . Paresthesia 08/09/2015  . Uterine fibroid 06/12/2014  . Fibroid, uterine   . Anemia 04/27/2014  . Low back pain 12/22/2010   Past Medical History:  Past Medical History  Diagnosis Date  . GERD (gastroesophageal reflux disease)   . Anemia   . Allergy   . Hashimoto thyroiditis   . Goiter   . Obesity   . Uterine fibroid    Past Surgical History:  Past Surgical History  Procedure Laterality Date  . Cholecystectomy     HPI:  Pt is a 50 y.o. female with PMH of Anemia, Hypothyroidism, presented with headaches on 5/26 with dizziness and blurred vision. R side paresthesia the night before. Suspected complicated migraine. MRI 5/26 showed no acute findings. SLP eval ordered as part of stroke workup.   Assessment / Plan / Recommendation Clinical Impression  Pt currently demonstrating speech/ language/ cognitive skills within normal limits for tasks assessed. Pt is conversant and expressed not noticing any changes in communication skills when admitted or currently. No further SLP f/u needed. Please re-consult if needs arise.    SLP Assessment  Patient does not need any further Speech Lanaguage Pathology Services    Follow Up Recommendations  None    Frequency and Duration           SLP Evaluation Prior Functioning  Cognitive/Linguistic Baseline: Within functional limits Type of Home: House Available Help at Discharge: Family;Available 24 hours/day Vocation: Full time employment   Cognition  Overall Cognitive Status: Within Functional Limits for tasks assessed Arousal/Alertness: Awake/alert Orientation Level: Oriented X4 Attention: Sustained;Selective;Alternating Sustained Attention:  Appears intact Selective Attention: Appears intact Alternating Attention: Appears intact Memory: Appears intact Safety/Judgment: Appears intact    Comprehension  Auditory Comprehension Overall Auditory Comprehension: Appears within functional limits for tasks assessed Yes/No Questions: Within Functional Limits Commands: Within Functional Limits Conversation: Complex Reading Comprehension Reading Status: Within funtional limits    Expression Expression Primary Mode of Expression: Verbal Verbal Expression Overall Verbal Expression: Appears within functional limits for tasks assessed Initiation: No impairment Level of Generative/Spontaneous Verbalization: Conversation Repetition: No impairment Naming: No impairment Pragmatics: No impairment Non-Verbal Means of Communication: Not applicable   Oral / Motor  Oral Motor/Sensory Function Overall Oral Motor/Sensory Function: Within functional limits Motor Speech Overall Motor Speech: Appears within functional limits for tasks assessed Respiration: Within functional limits Phonation: Normal Resonance: Within functional limits Articulation: Within functional limitis Intelligibility: Intelligible Motor Planning: Witnin functional limits Motor Speech Errors: Not applicable   GO          Functional Assessment Tool Used: clinical judgment Functional Limitations: Other Speech Language Pathology Other Speech-Language Pathology Functional Limitation [phone]): 0 percent impaired, limited or restricted         Kern Reap, [place], CCC-SLP 08/10/2015, 3:47 PM [phone]

## 2015-08-10 NOTE — Evaluation (Signed)
Physical Therapy Evaluation and D/C Patient Details Name: Denise Schroeder MRN: [id number] DOB: 01/02/1966 Today's Date: 08/10/2015   History of Present Illness  Denise Schroeder is a 50 y.o. female with PMH of Anemia, Hypothyroidism, presented with headaches. Patient reports of intermittent tension like occipital, non radiating headaches since yesterday. She also c/o dizziness and blurred vision around 11 AM. Also reported right sided paresthesia which happened last night. Code stroke was called in ED. Per neurology: recommended further eval r/o CVA  Clinical Impression  Pt admitted with above diagnosis. Pt currently without significant functional limitations and is able to ambulate independently, go up and down steps and perform balance activities without physical assist.  Pt does not need OT services as she can perform ADL's independently -reach hair and face for grooming, don/doff socks, and other ADL activity.  This PT informed OT of no OT needs for this pt.  Will sign off as pt is independent and at baseline.  Thanks.      Follow Up Recommendations No PT follow up    Equipment Recommendations  None recommended by PT    Recommendations for Other Services       Precautions / Restrictions Precautions Precautions: None Restrictions Weight Bearing Restrictions: No      Mobility  Bed Mobility Overal bed mobility: Independent                Transfers Overall transfer level: Independent                  Ambulation/Gait Ambulation/Gait assistance: Independent Ambulation Distance (Feet): 450 Feet Assistive device: None Gait Pattern/deviations: WFL(Within Functional Limits)   Gait velocity interpretation: at or above normal speed for age/gender General Gait Details: No difficulties with challenges to balance.   Stairs Stairs: Yes Stairs assistance: Supervision Stair Management: No rails;Alternating pattern;Forwards Number of Stairs: 4 General stair comments: No  difficulties on steps.  No assist needed.  Wheelchair Mobility    Modified Rankin (Stroke Patients Only) Modified Rankin (Stroke Patients Only) Pre-Morbid Rankin Score: No symptoms Modified Rankin: Slight disability     Balance Overall balance assessment: Independent                               Standardized Balance Assessment Standardized Balance Assessment : Dynamic Gait Index   Dynamic Gait Index Level Surface: Normal Change in Gait Speed: Normal Gait with Horizontal Head Turns: Normal Gait with Vertical Head Turns: Normal Gait and Pivot Turn: Normal Step Over Obstacle: Normal Step Around Obstacles: Normal Steps: Normal Total Score: 24       Pertinent Vitals/Pain Pain Assessment: No/denies pain  VSS    Home Living Family/patient expects to be discharged to:: Private residence Living Arrangements: Alone Available Help at Discharge: Family;Available 24 hours/day Type of Home: House Home Access: Stairs to enter Entrance Stairs-Rails: None Entrance Stairs-Number of Steps: 2 Home Layout: One level Home Equipment: None      Prior Function Level of Independence: Independent               Hand Dominance        Extremity/Trunk Assessment   Upper Extremity Assessment: Defer to OT evaluation           Lower Extremity Assessment: Overall WFL for tasks assessed      Cervical / Trunk Assessment: Normal  Communication   Communication: No difficulties  Cognition Arousal/Alertness: Awake/alert Behavior During Therapy: WFL for tasks assessed/performed  Overall Cognitive Status: Within Functional Limits for tasks assessed                      General Comments General comments (skin integrity, edema, etc.): Perfect score on DGI.  No balance issues.     Exercises        Assessment/Plan    PT Assessment Patent does not need any further PT services  PT Diagnosis Generalized weakness   PT Problem List    PT Treatment  Interventions     PT Goals (Current goals can be found in the Care Plan section) Acute Rehab PT Goals Patient Stated Goal: to go home today PT Goal Formulation: All assessment and education complete, DC therapy    Frequency     Barriers to discharge        Co-evaluation               End of Session Equipment Utilized During Treatment: Gait belt Activity Tolerance: Patient tolerated treatment well Patient left: in bed;with call bell/phone within reach;with bed alarm set Nurse Communication: Mobility status    Functional Assessment Tool Used: clinical judgment Functional Limitation: Mobility: Walking and moving around Mobility: Walking and Moving Around Current Status [phone]): 0 percent impaired, limited or restricted Mobility: Walking and Moving Around Goal Status [phone]): 0 percent impaired, limited or restricted Mobility: Walking and Moving Around Discharge Status [phone]): 0 percent impaired, limited or restricted    Time: 0906-0919 PT Time Calculation (min) (ACUTE ONLY): 13 min   Charges:   PT Evaluation $PT Eval Low Complexity: 1 Procedure     PT G Codes:   PT G-Codes **NOT FOR INPATIENT CLASS** Functional Assessment Tool Used: clinical judgment Functional Limitation: Mobility: Walking and moving around Mobility: Walking and Moving Around Current Status [id number]): 0 percent impaired, limited or restricted Mobility: Walking and Moving Around Goal Status [id number]): 0 percent impaired, limited or restricted Mobility: Walking and Moving Around Discharge Status [phone]): 0 percent impaired, limited or restricted    Irwin Brakeman F 08/10/2015, 10:01 AM Paola Flynt,PT Acute Rehabilitation [phone] (pager)

## 2015-08-10 NOTE — Progress Notes (Signed)
PROGRESS NOTE    Denise Schroeder  [id number] DOB: [birth year] DOA: 08/09/2015 PCP: Crisoforo Oxford, PA-C    Brief Narrative:  50 y/o  Female with PMH of Anemia, Hypothyroidism, presented with HA's and right sided paresthesias.    Assessment & Plan:   Active Problems:   Headache - improving - continue supportive therapy    Paresthesia - currently undergoing stroke/tia work up - Neurology assisting  DVT prophylaxis: Lovenox Code Status: full Family Communication: None at bedside Disposition Plan: Pending final work up   Consultants:  Neurology   Procedures: None, just imaging studies. Awaiting Echo   Antimicrobials: None   Subjective: Pt has no new complaints.  Objective: Filed Vitals:   08/10/15 0300 08/10/15 0500 08/10/15 0957 08/10/15 1438  BP: 101/49 100/57 110/48 109/57  Pulse: 59 67 57 62  Temp: 97.8 F (36.6 C) 97.2 F (36.2 C) 98.7 F (37.1 C) 98.1 F (36.7 C)  TempSrc: Oral Oral Oral Oral  Resp: 15 15 20 20   Height:      Weight:      SpO2: 97% 97% 98% 99%    Intake/Output Summary (Last 24 hours) at 08/10/15 1526 Last data filed at 08/09/15 1713  Gross per 24 hour  Intake    360 ml  Output      0 ml  Net    360 ml   Filed Weights   08/09/15 1300 08/09/15 1341  Weight: 125.8 kg (277 lb 5.4 oz) 125.8 kg (277 lb 5.4 oz)    Examination:  General exam: Appears calm and comfortable  Respiratory system: Clear to auscultation. Respiratory effort normal. Cardiovascular system: S1 & S2 heard, RRR. No JVD, murmurs, rubs, gallops or clicks. No pedal edema. Gastrointestinal system: Abdomen is nondistended, soft and nontender. No organomegaly or masses felt. Normal bowel sounds heard. Central nervous system: Alert and oriented. No focal neurological deficits. Extremities: Symmetric 5 x 5 power. Skin: No rashes, lesions or ulcers Psychiatry: Judgement and insight appear normal. Mood & affect appropriate.     Data Reviewed: I have  personally reviewed following labs and imaging studies  CBC:  Recent Labs Lab 08/09/15 1325 08/09/15 1334  WBC 6.0  --   NEUTROABS 3.6  --   HGB 11.4* 12.6  HCT 36.1 37.0  MCV 87.4  --   PLT 241  --    Basic Metabolic Panel:  Recent Labs Lab 08/09/15 1325 08/09/15 1334  NA 138 141  K 3.9 4.0  CL 107 105  CO2 24  --   GLUCOSE 92 88  BUN 11 12  CREATININE 0.89 0.90  CALCIUM 9.5  --    GFR: Estimated Creatinine Clearance: 106.3 mL/min (by C-G formula based on Cr of 0.9). Liver Function Tests:  Recent Labs Lab 08/09/15 1325  AST 15  ALT 13*  ALKPHOS 65  BILITOT 0.4  PROT 6.8  ALBUMIN 3.5   No results for input(s): LIPASE, AMYLASE in the last 168 hours. No results for input(s): AMMONIA in the last 168 hours. Coagulation Profile:  Recent Labs Lab 08/09/15 1325  INR 1.15   Cardiac Enzymes: No results for input(s): CKTOTAL, CKMB, CKMBINDEX, TROPONINI in the last 168 hours. BNP (last 3 results) No results for input(s): PROBNP in the last 8760 hours. HbA1C: No results for input(s): HGBA1C in the last 72 hours. CBG: No results for input(s): GLUCAP in the last 168 hours. Lipid Profile:  Recent Labs  08/10/15 0254  CHOL 176  HDL 49  LDLCALC 114*  TRIG 66  CHOLHDL 3.6   Thyroid Function Tests: No results for input(s): TSH, T4TOTAL, FREET4, T3FREE, THYROIDAB in the last 72 hours. Anemia Panel: No results for input(s): VITAMINB12, FOLATE, FERRITIN, TIBC, IRON, RETICCTPCT in the last 72 hours. Sepsis Labs: No results for input(s): PROCALCITON, LATICACIDVEN in the last 168 hours.  No results found for this or any previous visit (from the past 240 hour(s)).       Radiology Studies: Ct Angio Head W/cm &/or Wo Cm  08/09/2015  CLINICAL DATA:  50 year old female with headache and dizziness since yesterday. Subsequent encounter. EXAM: CT ANGIOGRAPHY HEAD AND NECK TECHNIQUE: Multidetector CT imaging of the head and neck was performed using the standard  protocol during bolus administration of intravenous contrast. Multiplanar CT image reconstructions and MIPs were obtained to evaluate the vascular anatomy. Carotid stenosis measurements (when applicable) are obtained utilizing NASCET criteria, using the distal internal carotid diameter as the denominator. CONTRAST:  50 cc Isovue 370. COMPARISON:  08/09/2015 head CT.  12/19/2014 brain MR. FINDINGS: CT HEAD Brain: No intracranial hemorrhage. No CT evidence of large acute infarct. No intracranial mass or abnormal enhancement. No hydrocephalus. Incidentally noted is a cavum septum pellucidum. Calvarium and skull base: Mild hyperostosis frontalis interna. Paranasal sinuses: Bubbly opacification left sphenoid sinus with air-fluid level suggesting acute sinusitis. Orbits: Minimal exophthalmos. CTA NECK Aortic arch: 3 vessel arch. Right carotid system: No significant narrowing or regularity. Left carotid system: Mild plaque left carotid bifurcation without significant narrowing. Vertebral arteries:Limited imaging of proximal left vertebral artery secondary to venous reflux. No obvious stenosis. Vertebral arteries are of similar size and small in caliber. Skeleton: No worrisome osseous abnormality. Mild cervical spondylotic changes. Evaluation limited by habitus. Other neck: No mass or inflammation. CTA HEAD Anterior circulation: No significant stenosis cavernous segment of either internal carotid artery. Slight ectasia with proximal narrowing petrous segment right internal carotid artery. Mild narrowing and irregularity petrous segment left internal carotid artery. Hypoplastic A1 segment left anterior cerebral artery. Mild irregularity and minimal narrowing M1 segment of middle cerebral artery bilaterally. Middle cerebral artery branch vessel narrowing and irregularity bilaterally. Posterior circulation: Narrowed irregular distal vertebral arteries. Markedly attenuated narrowed irregular basilar artery. Fetal contribution  to the posterior cerebral arteries. Mild irregularity posterior cerebral arteries without high-grade stenosis. Poor delineation posterior inferior cerebellar arteries. Venous sinuses: Patent.  Venous phase imaging limited. Anatomic variants: As above. Delayed phase: As above. IMPRESSION: CT HEAD No intracranial hemorrhage. No CT evidence of large acute infarct. No intracranial mass or abnormal enhancement. Bubbly opacification left sphenoid sinus with air-fluid level suggesting acute sinusitis. CTA NECK No significant stenosis of either carotid artery. Limited imaging of proximal left vertebral artery secondary to venous reflux. No obvious stenosis. Mild cervical spondylotic changes. Evaluation limited by patient's habitus. CTA HEAD No significant stenosis cavernous segment of either internal carotid artery. Slight ectasia with proximal narrowing petrous segment right internal carotid artery. Mild narrowing and irregularity petrous segment left internal carotid artery. Hypoplastic A1 segment left anterior cerebral artery. Mild irregularity and minimal narrowing M1 segment of middle cerebral artery bilaterally. Middle cerebral artery branch vessel narrowing and irregularity bilaterally. Narrowed irregular distal vertebral arteries. Markedly attenuated narrowed irregular basilar artery. Fetal contribution to the posterior cerebral arteries. Mild irregularity posterior cerebral arteries without high-grade stenosis. Poor delineation posterior inferior cerebellar arteries. Electronically Signed   By: Genia Del M.D.   On: 08/09/2015 14:56   Ct Head Wo Contrast  08/09/2015  CLINICAL DATA:  Code stroke, headache, present since yesterday, dizziness  EXAM: CT HEAD WITHOUT CONTRAST TECHNIQUE: Contiguous axial images were obtained from the base of the skull through the vertex without intravenous contrast. COMPARISON:  MR brain 12/19/2014 FINDINGS: There is no evidence of mass effect, midline shift or extra-axial fluid  collections. There is no evidence of a space-occupying lesion or intracranial hemorrhage. There is no evidence of a cortical-based area of acute infarction. There is mild generalized cerebral atrophy. The ventricles and sulci are appropriate for the patient's age. The basal cisterns are patent. Visualized portions of the orbits are unremarkable. Small air-fluid level in the left sphenoid sinus. The osseous structures are unremarkable. IMPRESSION: No acute intracranial pathology. Electronically Signed   By: Kathreen Devoid   On: 08/09/2015 13:43   Ct Angio Neck W/cm &/or Wo/cm  08/09/2015  CLINICAL DATA:  50 year old female with headache and dizziness since yesterday. Subsequent encounter. EXAM: CT ANGIOGRAPHY HEAD AND NECK TECHNIQUE: Multidetector CT imaging of the head and neck was performed using the standard protocol during bolus administration of intravenous contrast. Multiplanar CT image reconstructions and MIPs were obtained to evaluate the vascular anatomy. Carotid stenosis measurements (when applicable) are obtained utilizing NASCET criteria, using the distal internal carotid diameter as the denominator. CONTRAST:  50 cc Isovue 370. COMPARISON:  08/09/2015 head CT.  12/19/2014 brain MR. FINDINGS: CT HEAD Brain: No intracranial hemorrhage. No CT evidence of large acute infarct. No intracranial mass or abnormal enhancement. No hydrocephalus. Incidentally noted is a cavum septum pellucidum. Calvarium and skull base: Mild hyperostosis frontalis interna. Paranasal sinuses: Bubbly opacification left sphenoid sinus with air-fluid level suggesting acute sinusitis. Orbits: Minimal exophthalmos. CTA NECK Aortic arch: 3 vessel arch. Right carotid system: No significant narrowing or regularity. Left carotid system: Mild plaque left carotid bifurcation without significant narrowing. Vertebral arteries:Limited imaging of proximal left vertebral artery secondary to venous reflux. No obvious stenosis. Vertebral arteries are  of similar size and small in caliber. Skeleton: No worrisome osseous abnormality. Mild cervical spondylotic changes. Evaluation limited by habitus. Other neck: No mass or inflammation. CTA HEAD Anterior circulation: No significant stenosis cavernous segment of either internal carotid artery. Slight ectasia with proximal narrowing petrous segment right internal carotid artery. Mild narrowing and irregularity petrous segment left internal carotid artery. Hypoplastic A1 segment left anterior cerebral artery. Mild irregularity and minimal narrowing M1 segment of middle cerebral artery bilaterally. Middle cerebral artery branch vessel narrowing and irregularity bilaterally. Posterior circulation: Narrowed irregular distal vertebral arteries. Markedly attenuated narrowed irregular basilar artery. Fetal contribution to the posterior cerebral arteries. Mild irregularity posterior cerebral arteries without high-grade stenosis. Poor delineation posterior inferior cerebellar arteries. Venous sinuses: Patent.  Venous phase imaging limited. Anatomic variants: As above. Delayed phase: As above. IMPRESSION: CT HEAD No intracranial hemorrhage. No CT evidence of large acute infarct. No intracranial mass or abnormal enhancement. Bubbly opacification left sphenoid sinus with air-fluid level suggesting acute sinusitis. CTA NECK No significant stenosis of either carotid artery. Limited imaging of proximal left vertebral artery secondary to venous reflux. No obvious stenosis. Mild cervical spondylotic changes. Evaluation limited by patient's habitus. CTA HEAD No significant stenosis cavernous segment of either internal carotid artery. Slight ectasia with proximal narrowing petrous segment right internal carotid artery. Mild narrowing and irregularity petrous segment left internal carotid artery. Hypoplastic A1 segment left anterior cerebral artery. Mild irregularity and minimal narrowing M1 segment of middle cerebral artery bilaterally.  Middle cerebral artery branch vessel narrowing and irregularity bilaterally. Narrowed irregular distal vertebral arteries. Markedly attenuated narrowed irregular basilar artery. Fetal contribution to the posterior cerebral  arteries. Mild irregularity posterior cerebral arteries without high-grade stenosis. Poor delineation posterior inferior cerebellar arteries. Electronically Signed   By: Genia Del M.D.   On: 08/09/2015 14:56   Mr Brain Wo Contrast  08/09/2015  CLINICAL DATA:  51 year old female with headaches. Dizziness and blurred vision. Right-sided paresthesias. Subsequent encounter. EXAM: MRI HEAD WITHOUT CONTRAST TECHNIQUE: Multiplanar, multiecho pulse sequences of the brain and surrounding structures were obtained without intravenous contrast. COMPARISON:  CT angiogram 08/09/2015.  Brain MR 12/19/2014. FINDINGS: No acute infarct or intracranial hemorrhage. No intracranial mass lesion noted on this unenhanced exam. Small caliber patent vertebral arteries and basilar artery. Appearance without change. No hydrocephalus. Patent cavum septum pellucidum incidentally noted. Air-fluid level left sphenoid sinus suggestive of acute sinusitis. Cervical medullary junction, pituitary region, pineal region and orbital structures unremarkable. IMPRESSION: No acute infarct. Left sphenoid sinus air-fluid level suggestive of acute sinusitis. Electronically Signed   By: Genia Del M.D.   On: 08/09/2015 22:03    Scheduled Meds: . aspirin  325 mg Oral Daily  . atorvastatin  40 mg Oral q1800  . enoxaparin (LOVENOX) injection  40 mg Subcutaneous Q24H  . levothyroxine  100 mcg Oral QAC breakfast  . loratadine  10 mg Oral Daily   Continuous Infusions: . sodium chloride 100 mL/hr at 08/09/15 1856    Time spent: > 35 minutes   Velvet Bathe, MD [facility] Pager [phone]  If 7PM-7AM, please contact night-coverage [email or web address] Password [email or web address] 08/10/2015, 3:26 PM

## 2015-08-10 NOTE — Progress Notes (Signed)
STROKE TEAM PROGRESS NOTE   HISTORY OF PRESENT ILLNESS (per record) Denise Schroeder is a 50 y.o. female with headache since yesterday which she describes as located over her left occipital area. Last night she noticed her right arm go numb, but did not think much of it. Today, she had sudden worsening of headache while she was driving and at the same time noticed significant "dizziness." Her description this sounds vertiginous to me. She was brought in as a code stroke, but was not given TPA due to the numbness that started last night. She was seen to have ataxia on the left.  CT angiogram was performed to rule out intervenable lesions and shows a very diminutive posterior circulation, but I suspect that this is chronic with a left vertebral ending in PICA and bilateral fetal PCAs    LKW: 5/25 10 PM tpa given?: no, outside of window   SUBJECTIVE (INTERVAL HISTORY) Her family was not at the bedside.  Overall she feels her condition is rapidly improving. She still has slight occipital headache   OBJECTIVE Temp:  [97.2 F (36.2 C)-98.8 F (37.1 C)] 97.2 F (36.2 C) (05/27 0500) Pulse Rate:  [52-89] 67 (05/27 0500) Cardiac Rhythm:  [-] Other (Comment) (05/26 1901) Resp:  [15-31] 15 (05/27 0500) BP: (90-138)/(42-100) 100/57 mmHg (05/27 0500) SpO2:  [96 %-100 %] 97 % (05/27 0500) Weight:  [125.8 kg (277 lb 5.4 oz)] 125.8 kg (277 lb 5.4 oz) (05/26 1341)  CBC:  Recent Labs Lab 08/09/15 1325 08/09/15 1334  WBC 6.0  --   NEUTROABS 3.6  --   HGB 11.4* 12.6  HCT 36.1 37.0  MCV 87.4  --   PLT 241  --     Basic Metabolic Panel:  Recent Labs Lab 08/09/15 1325 08/09/15 1334  NA 138 141  K 3.9 4.0  CL 107 105  CO2 24  --   GLUCOSE 92 88  BUN 11 12  CREATININE 0.89 0.90  CALCIUM 9.5  --     Lipid Panel:    Component Value Date/Time   CHOL 176 08/10/2015 0254   TRIG 66 08/10/2015 0254   HDL 49 08/10/2015 0254   CHOLHDL 3.6 08/10/2015 0254   VLDL 13 08/10/2015 0254    LDLCALC 114* 08/10/2015 0254   HgbA1c:  Lab Results  Component Value Date   HGBA1C 5.3 11/07/2014   Urine Drug Screen:    Component Value Date/Time   LABOPIA NONE DETECTED 08/09/2015 1502   COCAINSCRNUR NONE DETECTED 08/09/2015 1502   LABBENZ NONE DETECTED 08/09/2015 1502   AMPHETMU NONE DETECTED 08/09/2015 1502   THCU NONE DETECTED 08/09/2015 1502   LABBARB NONE DETECTED 08/09/2015 1502      IMAGING  Ct Angio Head and Neck W/cm &/or Wo Cm 08/09/2015     CT HEAD  No intracranial hemorrhage. No CT evidence of large acute infarct. No intracranial mass or abnormal enhancement. Bubbly opacification left sphenoid sinus with air-fluid level suggesting acute sinusitis.    CTA NECK  No significant stenosis of either carotid artery. Limited imaging of proximal left vertebral artery secondary to venous reflux. No obvious stenosis. Mild cervical spondylotic changes. Evaluation limited by patient's habitus.    CTA HEAD  No significant stenosis cavernous segment of either internal carotid artery. Slight ectasia with proximal narrowing petrous segment right internal carotid artery. Mild narrowing and irregularity petrous segment left internal carotid artery. Hypoplastic A1 segment left anterior cerebral artery. Mild irregularity and minimal narrowing M1 segment of middle cerebral  artery bilaterally. Middle cerebral artery branch vessel narrowing and irregularity bilaterally. Narrowed irregular distal vertebral arteries. Markedly attenuated narrowed irregular basilar artery. Fetal contribution to the posterior cerebral arteries. Mild irregularity posterior cerebral arteries without high-grade stenosis. Poor delineation posterior inferior cerebellar arteries.    Ct Head Wo Contrast 08/09/2015   No acute intracranial pathology.     Mr Brain Wo Contrast 08/09/2015   No acute infarct. Left sphenoid sinus air-fluid level suggestive of acute sinusitis.     PHYSICAL EXAM Physical Exam   Constitutional: Appears well-developed and well-nourished.  Psych: Affect appropriate to situation Eyes: No scleral injection HENT: No OP obstrucion Head: Normocephalic.  Cardiovascular: Normal rate and regular rhythm.  Respiratory: Effort normal and breath sounds normal to anterior ascultation GI: Soft. No distension. There is no tenderness.  Skin: WDI  Neuro: Mental Status: Patient is awake, alert, oriented to person, place, month, year, and situation. Patient is able to give a clear and coherent history. No signs of aphasia or neglect Cranial Nerves: II: Visual Fields are full. Pupils are equal, round, and reactive to light.  III,IV, VI: EOMI without ptosis or diploplia.  V: Facial sensation is decreased on the right to pinprick  VII: Facial movement is symmetric.  VIII: hearing is intact to voice X: Uvula elevates symmetrically XI: Shoulder shrug is symmetric. XII: tongue is midline without atrophy or fasciculations.  Motor: Tone is normal. Bulk is normal. 5/5 strength was present in all four extremities.  Sensory: Sensation is decreased throughout the right side  Cerebellar: FNF and HKS are intact on the right, ataxic on the left       ASSESSMENT/PLAN Ms. Denise Schroeder is a 50 y.o. female with history of Hashimoto's thyroiditis, goiter, obesity, and anemia presenting with left occipital headache, right arm numbness, and dizziness. She did not receive IV t-PA due to late presentation.  Possible TIA:  Dominant - unknown source.  Resultant  resolution of deficits  MRI - no acute infarct  MRA  - not performed  CTA of head and neck - unremarkable  Carotid Doppler - refer to CTA of the neck.  2D Echo - pending  LDL - 114  HgbA1c pending  VTE prophylaxis - Lovenox  Diet Heart Room service appropriate?: Yes; Fluid consistency:: Thin  No antithrombotic prior to admission, now on aspirin 325 mg daily  Patient counseled to be compliant with her  antithrombotic medications  Ongoing aggressive stroke risk factor management  Therapy recommendations: The patient is back to baseline. No follow-up therapies recommended.  Disposition:  Pending  Hypertension  Blood pressure tends to run low  Permissive hypertension (OK if < 220/120) but gradually normalize in 5-7 days  Hyperlipidemia  Home meds:  No lipid lowering medications prior to admission. resumed in hospital  LDL 114, goal < 70  Add Lipitor 40 mg daily  Continue statin at discharge  Other Stroke Risk Factors  ETOH use  Obesity, Body mass index is 40.94 kg/(m^2).    Other Active Problems    Hospital day #   ATTENDING NOTE: Patient was seen and examined by me personally. Documentation reflects findings. The laboratory and radiographic studies reviewed by me. ROS completed by me personally and pertinent positives fully documented  Condition: Stable  Assessment and plan completed by me personally and fully documented above. Plans/Recommendations include:     Await ECHO  Await hemoglobin A1C  Add Lipitor.    Discussed the difference between complicated migraine and TIA.  Encouraged stroke risk factor  management  SIGNED BY: Dr. Elissa Hefty      To contact Stroke Continuity provider, please refer to [email or web address] After hours, contact General Neurology

## 2015-08-10 NOTE — Progress Notes (Signed)
*  PRELIMINARY RESULTS* Echocardiogram 2D Echocardiogram has been performed.  Leavy Cella 08/10/2015, 3:50 PM

## 2015-08-10 NOTE — Progress Notes (Signed)
OT Cancellation Note  Patient Details Name: Denise Schroeder MRN: [id number] DOB: 1965-07-16   Cancelled Treatment:    Reason Eval/Treat Not Completed: OT screened, no needs identified, will sign off. Spoke with PT and pt with no OT needs at this time. Pt able to complete all ADLs independently and pt reports she is back to her baseline. Please re-consult if situation or needs change.  Redmond Baseman, OTR/L Pager: [phone] 08/10/2015, 9:20 AM

## 2015-08-11 DIAGNOSIS — R51 Headache: Secondary | ICD-10-CM | POA: Diagnosis not present

## 2015-08-11 DIAGNOSIS — R202 Paresthesia of skin: Secondary | ICD-10-CM | POA: Diagnosis not present

## 2015-08-11 DIAGNOSIS — E785 Hyperlipidemia, unspecified: Secondary | ICD-10-CM | POA: Diagnosis not present

## 2015-08-11 DIAGNOSIS — E7800 Pure hypercholesterolemia, unspecified: Secondary | ICD-10-CM | POA: Insufficient documentation

## 2015-08-11 MED ORDER — ASPIRIN EC 81 MG PO TBEC: 81.0000 mg | DELAYED_RELEASE_TABLET | Freq: Every day | ORAL | Status: DC

## 2015-08-11 MED ORDER — ASPIRIN 325 MG PO TABS: 325.0000 mg | ORAL_TABLET | Freq: Every day | ORAL | Status: DC

## 2015-08-11 MED ORDER — ATORVASTATIN CALCIUM 40 MG PO TABS: 40.0000 mg | ORAL_TABLET | Freq: Every day | ORAL | Status: DC

## 2015-08-11 NOTE — Progress Notes (Signed)
STROKE TEAM PROGRESS NOTE   HISTORY OF PRESENT ILLNESS (per record) Denise Schroeder is a 50 y.o. female with headache since yesterday which she describes as located over her left occipital area. Last night she noticed her right arm go numb, but did not think much of it. Today, she had sudden worsening of headache while she was driving and at the same time noticed significant "dizziness." Her description this sounds vertiginous to me. She was brought in as a code stroke, but was not given TPA due to the numbness that started last night. She was seen to have ataxia on the left.  CT angiogram was performed to rule out intervenable lesions and shows a very diminutive posterior circulation, but I suspect that this is chronic with a left vertebral ending in PICA and bilateral fetal PCAs    LKW: 5/25 10 PM tpa given?: no, outside of window   SUBJECTIVE (INTERVAL HISTORY) Patient reports having headache overnight, but no HA now.  Tylenol was sufficient.  We discussed her recent life stressor.  A 25-year relationship recently came to an unexpected end.  She is seeing a therapist and recognizes that she is under significant stress.  I recommended that she discuss antidepressants with her therapist if she continues to have mood change as a result of the current stressor.  We again discussed lifestyle modification.  I explained the similarity between complicated migraine and TIA.  Given her CV risks age, race, morbid obesity, elevated LDL, she is at risk.     OBJECTIVE Temp:  [97.9 F (36.6 C)-98.7 F (37.1 C)] 98.7 F (37.1 C) (05/28 0923) Pulse Rate:  [56-66] 66 (05/28 0923) Cardiac Rhythm:  [-] Sinus bradycardia (05/28 0700) Resp:  [20] 20 (05/28 0923) BP: (97-125)/(45-82) 101/45 mmHg (05/28 0923) SpO2:  [98 %-99 %] 98 % (05/28 0923)  CBC:   Recent Labs Lab 08/09/15 1325 08/09/15 1334  WBC 6.0  --   NEUTROABS 3.6  --   HGB 11.4* 12.6  HCT 36.1 37.0  MCV 87.4  --   PLT 241  --      Basic Metabolic Panel:   Recent Labs Lab 08/09/15 1325 08/09/15 1334  NA 138 141  K 3.9 4.0  CL 107 105  CO2 24  --   GLUCOSE 92 88  BUN 11 12  CREATININE 0.89 0.90  CALCIUM 9.5  --     Lipid Panel:     Component Value Date/Time   CHOL 176 08/10/2015 0254   TRIG 66 08/10/2015 0254   HDL 49 08/10/2015 0254   CHOLHDL 3.6 08/10/2015 0254   VLDL 13 08/10/2015 0254   LDLCALC 114* 08/10/2015 0254   HgbA1c:  Lab Results  Component Value Date   HGBA1C 5.3 11/07/2014   Urine Drug Screen:     Component Value Date/Time   LABOPIA NONE DETECTED 08/09/2015 1502   COCAINSCRNUR NONE DETECTED 08/09/2015 1502   LABBENZ NONE DETECTED 08/09/2015 1502   AMPHETMU NONE DETECTED 08/09/2015 1502   THCU NONE DETECTED 08/09/2015 1502   LABBARB NONE DETECTED 08/09/2015 1502      IMAGING  Ct Angio Head and Neck W/cm &/or Wo Cm 08/09/2015     CT HEAD  No intracranial hemorrhage. No CT evidence of large acute infarct. No intracranial mass or abnormal enhancement. Bubbly opacification left sphenoid sinus with air-fluid level suggesting acute sinusitis.    CTA NECK  No significant stenosis of either carotid artery. Limited imaging of proximal left vertebral artery secondary to venous  reflux. No obvious stenosis. Mild cervical spondylotic changes. Evaluation limited by patient's habitus.    CTA HEAD  No significant stenosis cavernous segment of either internal carotid artery. Slight ectasia with proximal narrowing petrous segment right internal carotid artery. Mild narrowing and irregularity petrous segment left internal carotid artery. Hypoplastic A1 segment left anterior cerebral artery. Mild irregularity and minimal narrowing M1 segment of middle cerebral artery bilaterally. Middle cerebral artery branch vessel narrowing and irregularity bilaterally. Narrowed irregular distal vertebral arteries. Markedly attenuated narrowed irregular basilar artery. Fetal contribution to the posterior  cerebral arteries. Mild irregularity posterior cerebral arteries without high-grade stenosis. Poor delineation posterior inferior cerebellar arteries.    Ct Head Wo Contrast 08/09/2015   No acute intracranial pathology.   Mr Brain Wo Contrast 08/09/2015   No acute infarct. Left sphenoid sinus air-fluid level suggestive of acute sinusitis.   PHYSICAL EXAM Physical Exam  Constitutional: Appears well-developed and well-nourished.  Psych: Affect appropriate to situation Eyes: No scleral injection HENT: No OP obstrucion Head: Normocephalic.  Cardiovascular: Normal rate and regular rhythm.  Respiratory: Effort normal and breath sounds normal to anterior ascultation GI: Soft. No distension. There is no tenderness.  Skin: WDI  Neuro: Mental Status: Patient is awake, alert, oriented to person, place, month, year, and situation. Patient is able to give a clear and coherent history. No signs of aphasia or neglect Cranial Nerves: II: Visual Fields are full. Pupils are equal, round, and reactive to light.  III,IV, VI: EOMI without ptosis or diploplia.  V: Facial sensation is decreased on the right to pinprick  VII: Facial movement is symmetric.  VIII: hearing is intact to voice X: Uvula elevates symmetrically XI: Shoulder shrug is symmetric. XII: tongue is midline without atrophy or fasciculations.  Motor: Tone is normal. Bulk is normal. 5/5 strength was present in all four extremities.  Sensory: Sensation is decreased throughout the right side  Cerebellar: FNF and HKS are intact on the right, ataxic on the left   ASSESSMENT/PLAN Ms. Denise Schroeder is a 50 y.o. female with history of Hashimoto's thyroiditis, goiter, obesity, and anemia presenting with left occipital headache, right arm numbness, and dizziness. She did not receive IV t-PA due to late presentation.  Possible TIA:  Dominant - unknown source.  Resultant  resolution of deficits  MRI - no acute  infarct  MRA  - not performed  CTA of head and neck - unremarkable  Carotid Doppler - refer to CTA of the neck.  2D Echo - EF 60-65%. No cardiac source of emboli identified.  LDL - 114  HgbA1c pending  VTE prophylaxis - Lovenox Diet Heart Room service appropriate?: Yes; Fluid consistency:: Thin  No antithrombotic prior to admission, now on aspirin 325 mg daily  Patient counseled to be compliant with her antithrombotic medications  Ongoing aggressive stroke risk factor management  Therapy recommendations: The patient is back to baseline. No follow-up therapies recommended.  Disposition:  Pending  Hypertension  Blood pressure tends to run low  Permissive hypertension (OK if < 220/120) but gradually normalize in 5-7 days  Hyperlipidemia  Home meds:  No lipid lowering medications prior to admission. resumed in hospital  LDL 114, goal < 70  Add Lipitor 40 mg daily  Continue statin at discharge  Other Stroke Risk Factors  ETOH use  Obesity, Body mass index is 40.94 kg/(m^2).    Other Active Problems    Hospital day #   ATTENDING NOTE: Patient was seen and examined by me personally. Documentation reflects  findings. The laboratory and radiographic studies reviewed by me. ROS completed by me personally and pertinent positives fully documented  Condition: Stable  Assessment and plan completed by me personally and fully documented above. Plans/Recommendations include:      Awaiting hemoglobin A1C; please ensure follow-up as an outpatient  Discussed the difference between complicated migraine and TIA.  Encouraged stroke risk factor management  Recommend lifestyle modification.  Also, given her CV risks age, race, morbid obesity, elevated LDL, she is at risk.  Would continue ASA and statin management  SIGNED BY: Dr. Elissa Hefty      To contact Stroke Continuity provider, please refer to [email or web address] After hours, contact General Neurology

## 2015-08-11 NOTE — Progress Notes (Addendum)
Pt d/c to home by car with family. Assessment stable. Prescription given. All questions answered. Pt refused to be taken to car by wheelchair.

## 2015-08-11 NOTE — Discharge Summary (Signed)
Physician Discharge Summary  Denise Schroeder [id number] DOB: 1965/12/16 DOA: 08/09/2015  PCP: Crisoforo Oxford, PA-C  Admit date: 08/09/2015 Discharge date: 08/11/2015  Time spent: >35 minutes  Recommendations for Outpatient Follow-up:  1. Monitor lipid levels while in Lipitor   Discharge Diagnoses:  Active Problems:   Headache   Paresthesia   Hyperlipidemia   Discharge Condition: stable  Diet recommendation: Heart healthy  Filed Weights   08/09/15 1300 08/09/15 1341  Weight: 125.8 kg (277 lb 5.4 oz) 125.8 kg (277 lb 5.4 oz)    History of present illness:  50 y/o with history of hypothyroidism who presented with HA and right sided paresthesia  Hospital Course:  Right sided paresthesia - Neurology consulted and recommended the following: Denise Schroeder is a 50 y.o. female with history of Hashimoto's thyroiditis, goiter, obesity, and anemia presenting with left occipital headache, right arm numbness, and dizziness. She did not receive IV t-PA due to late presentation.  Possible TIA: Dominant - unknown source.  Resultant resolution of deficits  MRI - no acute infarct  MRA - not performed  CTA of head and neck - unremarkable  Carotid Doppler - refer to CTA of the neck.  2D Echo - EF 60-65%. No cardiac source of emboli identified.  LDL - 114  HgbA1c pending  VTE prophylaxis - Lovenox  Diet Heart Room service appropriate?: Yes; Fluid consistency:: Thin  No antithrombotic prior to admission, now on aspirin 325 mg daily  Patient counseled to be compliant with her antithrombotic medications  Ongoing aggressive stroke risk factor management  Therapy recommendations: The patient is back to baseline. No follow-up therapies recommended.  Disposition: Pending  Will discharge patient and will continue home medication regimen for her hypothyroidism   Procedures:  Please see above  Consultations:  Neurology  Discharge Exam: Filed Vitals:    08/11/15 0706 08/11/15 0923  BP: 125/68 101/45  Pulse: 56 66  Temp: 97.9 F (36.6 C) 98.7 F (37.1 C)  Resp: 20 20    General: Pt in nad, alert and awake Cardiovascular: rrr, no mrg Respiratory: no increased wob, no wheezes  Discharge Instructions   Discharge Instructions    Call MD for:  extreme fatigue    Complete by:  As directed      Call MD for:  temperature >100.4    Complete by:  As directed      Diet - low sodium heart healthy    Complete by:  As directed      Discharge instructions    Complete by:  As directed   Please f/u with your primary care physician in 1-2 week.     Increase activity slowly    Complete by:  As directed           Current Discharge Medication List    START taking these medications   Details  aspirin 325 MG tablet Take 1 tablet (325 mg total) by mouth daily. Qty: 30 tablet, Refills: 0    atorvastatin (LIPITOR) 40 MG tablet Take 1 tablet (40 mg total) by mouth daily at 6 PM. Qty: 30 tablet, Refills: 0      CONTINUE these medications which have NOT CHANGED   Details  CALCIUM PO Take 2 tablets by mouth at bedtime.     EPINEPHrine (EPIPEN 2-PAK) 0.3 mg/0.3 mL IJ SOAJ injection Inject 0.3 mLs (0.3 mg total) into the muscle once. Qty: 1 Device, Refills: 0    fexofenadine (ALLEGRA) 180 MG tablet Take 1 tablet (  180 mg total) by mouth daily. Qty: 90 tablet, Refills: 3    ibuprofen (ADVIL,MOTRIN) 800 MG tablet Take 1 tablet (800 mg total) by mouth 3 (three) times daily. Qty: 30 tablet, Refills: 0    IRON PO Take 1 tablet by mouth at bedtime as needed (for low iron leves).     levothyroxine (SYNTHROID, LEVOTHROID) 100 MCG tablet Take 1 tablet (100 mcg total) by mouth daily before breakfast. Qty: 90 tablet, Refills: 3    Multiple Vitamin (MULTIVITAMIN WITH MINERALS) TABS Take 1 tablet by mouth every morning.     ondansetron (ZOFRAN) 4 MG tablet Take 1 tablet (4 mg total) by mouth every 8 (eight) hours as needed for nausea or  vomiting. Qty: 20 tablet, Refills: 0    terbinafine (LAMISIL) 250 MG tablet Take 250 mg by mouth daily. Refills: 0       Allergies  Allergen Reactions  . Citrus Itching, Swelling and Cough    All fruits cause this reaction  . Latex Hives      The results of significant diagnostics from this hospitalization (including imaging, microbiology, ancillary and laboratory) are listed below for reference.    Significant Diagnostic Studies: Ct Angio Head W/cm &/or Wo Cm  08/09/2015  CLINICAL DATA:  50 year old female with headache and dizziness since yesterday. Subsequent encounter. EXAM: CT ANGIOGRAPHY HEAD AND NECK TECHNIQUE: Multidetector CT imaging of the head and neck was performed using the standard protocol during bolus administration of intravenous contrast. Multiplanar CT image reconstructions and MIPs were obtained to evaluate the vascular anatomy. Carotid stenosis measurements (when applicable) are obtained utilizing NASCET criteria, using the distal internal carotid diameter as the denominator. CONTRAST:  50 cc Isovue 370. COMPARISON:  08/09/2015 head CT.  12/19/2014 brain MR. FINDINGS: CT HEAD Brain: No intracranial hemorrhage. No CT evidence of large acute infarct. No intracranial mass or abnormal enhancement. No hydrocephalus. Incidentally noted is a cavum septum pellucidum. Calvarium and skull base: Mild hyperostosis frontalis interna. Paranasal sinuses: Bubbly opacification left sphenoid sinus with air-fluid level suggesting acute sinusitis. Orbits: Minimal exophthalmos. CTA NECK Aortic arch: 3 vessel arch. Right carotid system: No significant narrowing or regularity. Left carotid system: Mild plaque left carotid bifurcation without significant narrowing. Vertebral arteries:Limited imaging of proximal left vertebral artery secondary to venous reflux. No obvious stenosis. Vertebral arteries are of similar size and small in caliber. Skeleton: No worrisome osseous abnormality. Mild cervical  spondylotic changes. Evaluation limited by habitus. Other neck: No mass or inflammation. CTA HEAD Anterior circulation: No significant stenosis cavernous segment of either internal carotid artery. Slight ectasia with proximal narrowing petrous segment right internal carotid artery. Mild narrowing and irregularity petrous segment left internal carotid artery. Hypoplastic A1 segment left anterior cerebral artery. Mild irregularity and minimal narrowing M1 segment of middle cerebral artery bilaterally. Middle cerebral artery branch vessel narrowing and irregularity bilaterally. Posterior circulation: Narrowed irregular distal vertebral arteries. Markedly attenuated narrowed irregular basilar artery. Fetal contribution to the posterior cerebral arteries. Mild irregularity posterior cerebral arteries without high-grade stenosis. Poor delineation posterior inferior cerebellar arteries. Venous sinuses: Patent.  Venous phase imaging limited. Anatomic variants: As above. Delayed phase: As above. IMPRESSION: CT HEAD No intracranial hemorrhage. No CT evidence of large acute infarct. No intracranial mass or abnormal enhancement. Bubbly opacification left sphenoid sinus with air-fluid level suggesting acute sinusitis. CTA NECK No significant stenosis of either carotid artery. Limited imaging of proximal left vertebral artery secondary to venous reflux. No obvious stenosis. Mild cervical spondylotic changes. Evaluation limited by patient's habitus.  CTA HEAD No significant stenosis cavernous segment of either internal carotid artery. Slight ectasia with proximal narrowing petrous segment right internal carotid artery. Mild narrowing and irregularity petrous segment left internal carotid artery. Hypoplastic A1 segment left anterior cerebral artery. Mild irregularity and minimal narrowing M1 segment of middle cerebral artery bilaterally. Middle cerebral artery branch vessel narrowing and irregularity bilaterally. Narrowed irregular  distal vertebral arteries. Markedly attenuated narrowed irregular basilar artery. Fetal contribution to the posterior cerebral arteries. Mild irregularity posterior cerebral arteries without high-grade stenosis. Poor delineation posterior inferior cerebellar arteries. Electronically Signed   By: Genia Del M.D.   On: 08/09/2015 14:56   Ct Head Wo Contrast  08/09/2015  CLINICAL DATA:  Code stroke, headache, present since yesterday, dizziness EXAM: CT HEAD WITHOUT CONTRAST TECHNIQUE: Contiguous axial images were obtained from the base of the skull through the vertex without intravenous contrast. COMPARISON:  MR brain 12/19/2014 FINDINGS: There is no evidence of mass effect, midline shift or extra-axial fluid collections. There is no evidence of a space-occupying lesion or intracranial hemorrhage. There is no evidence of a cortical-based area of acute infarction. There is mild generalized cerebral atrophy. The ventricles and sulci are appropriate for the patient's age. The basal cisterns are patent. Visualized portions of the orbits are unremarkable. Small air-fluid level in the left sphenoid sinus. The osseous structures are unremarkable. IMPRESSION: No acute intracranial pathology. Electronically Signed   By: Kathreen Devoid   On: 08/09/2015 13:43   Ct Angio Neck W/cm &/or Wo/cm  08/09/2015  CLINICAL DATA:  50 year old female with headache and dizziness since yesterday. Subsequent encounter. EXAM: CT ANGIOGRAPHY HEAD AND NECK TECHNIQUE: Multidetector CT imaging of the head and neck was performed using the standard protocol during bolus administration of intravenous contrast. Multiplanar CT image reconstructions and MIPs were obtained to evaluate the vascular anatomy. Carotid stenosis measurements (when applicable) are obtained utilizing NASCET criteria, using the distal internal carotid diameter as the denominator. CONTRAST:  50 cc Isovue 370. COMPARISON:  08/09/2015 head CT.  12/19/2014 brain MR. FINDINGS: CT  HEAD Brain: No intracranial hemorrhage. No CT evidence of large acute infarct. No intracranial mass or abnormal enhancement. No hydrocephalus. Incidentally noted is a cavum septum pellucidum. Calvarium and skull base: Mild hyperostosis frontalis interna. Paranasal sinuses: Bubbly opacification left sphenoid sinus with air-fluid level suggesting acute sinusitis. Orbits: Minimal exophthalmos. CTA NECK Aortic arch: 3 vessel arch. Right carotid system: No significant narrowing or regularity. Left carotid system: Mild plaque left carotid bifurcation without significant narrowing. Vertebral arteries:Limited imaging of proximal left vertebral artery secondary to venous reflux. No obvious stenosis. Vertebral arteries are of similar size and small in caliber. Skeleton: No worrisome osseous abnormality. Mild cervical spondylotic changes. Evaluation limited by habitus. Other neck: No mass or inflammation. CTA HEAD Anterior circulation: No significant stenosis cavernous segment of either internal carotid artery. Slight ectasia with proximal narrowing petrous segment right internal carotid artery. Mild narrowing and irregularity petrous segment left internal carotid artery. Hypoplastic A1 segment left anterior cerebral artery. Mild irregularity and minimal narrowing M1 segment of middle cerebral artery bilaterally. Middle cerebral artery branch vessel narrowing and irregularity bilaterally. Posterior circulation: Narrowed irregular distal vertebral arteries. Markedly attenuated narrowed irregular basilar artery. Fetal contribution to the posterior cerebral arteries. Mild irregularity posterior cerebral arteries without high-grade stenosis. Poor delineation posterior inferior cerebellar arteries. Venous sinuses: Patent.  Venous phase imaging limited. Anatomic variants: As above. Delayed phase: As above. IMPRESSION: CT HEAD No intracranial hemorrhage. No CT evidence of large acute infarct. No intracranial  mass or abnormal  enhancement. Bubbly opacification left sphenoid sinus with air-fluid level suggesting acute sinusitis. CTA NECK No significant stenosis of either carotid artery. Limited imaging of proximal left vertebral artery secondary to venous reflux. No obvious stenosis. Mild cervical spondylotic changes. Evaluation limited by patient's habitus. CTA HEAD No significant stenosis cavernous segment of either internal carotid artery. Slight ectasia with proximal narrowing petrous segment right internal carotid artery. Mild narrowing and irregularity petrous segment left internal carotid artery. Hypoplastic A1 segment left anterior cerebral artery. Mild irregularity and minimal narrowing M1 segment of middle cerebral artery bilaterally. Middle cerebral artery branch vessel narrowing and irregularity bilaterally. Narrowed irregular distal vertebral arteries. Markedly attenuated narrowed irregular basilar artery. Fetal contribution to the posterior cerebral arteries. Mild irregularity posterior cerebral arteries without high-grade stenosis. Poor delineation posterior inferior cerebellar arteries. Electronically Signed   By: Genia Del M.D.   On: 08/09/2015 14:56   Mr Brain Wo Contrast  08/09/2015  CLINICAL DATA:  50 year old female with headaches. Dizziness and blurred vision. Right-sided paresthesias. Subsequent encounter. EXAM: MRI HEAD WITHOUT CONTRAST TECHNIQUE: Multiplanar, multiecho pulse sequences of the brain and surrounding structures were obtained without intravenous contrast. COMPARISON:  CT angiogram 08/09/2015.  Brain MR 12/19/2014. FINDINGS: No acute infarct or intracranial hemorrhage. No intracranial mass lesion noted on this unenhanced exam. Small caliber patent vertebral arteries and basilar artery. Appearance without change. No hydrocephalus. Patent cavum septum pellucidum incidentally noted. Air-fluid level left sphenoid sinus suggestive of acute sinusitis. Cervical medullary junction, pituitary region,  pineal region and orbital structures unremarkable. IMPRESSION: No acute infarct. Left sphenoid sinus air-fluid level suggestive of acute sinusitis. Electronically Signed   By: Genia Del M.D.   On: 08/09/2015 22:03   Dg Chest Port 1 View  08/10/2015  CLINICAL DATA:  Acute onset of generalized chest pain and headaches. Right-sided paresthesias. Initial encounter. EXAM: PORTABLE CHEST 1 VIEW COMPARISON:  Chest radiograph from 02/26/2014 FINDINGS: The lungs are well-aerated. Mild vascular congestion is noted. Mild bibasilar opacities likely reflect atelectasis. There is no evidence of pleural effusion or pneumothorax. The cardiomediastinal silhouette is borderline normal in size. No acute osseous abnormalities are seen. IMPRESSION: Mild vascular congestion noted. Mild bibasilar opacities likely reflect atelectasis. Electronically Signed   By: Garald Balding M.D.   On: 08/10/2015 19:56    Microbiology: No results found for this or any previous visit (from the past 240 hour(s)).   Labs: Basic Metabolic Panel:  Recent Labs Lab 08/09/15 1325 08/09/15 1334  NA 138 141  K 3.9 4.0  CL 107 105  CO2 24  --   GLUCOSE 92 88  BUN 11 12  CREATININE 0.89 0.90  CALCIUM 9.5  --    Liver Function Tests:  Recent Labs Lab 08/09/15 1325  AST 15  ALT 13*  ALKPHOS 65  BILITOT 0.4  PROT 6.8  ALBUMIN 3.5   No results for input(s): LIPASE, AMYLASE in the last 168 hours. No results for input(s): AMMONIA in the last 168 hours. CBC:  Recent Labs Lab 08/09/15 1325 08/09/15 1334  WBC 6.0  --   NEUTROABS 3.6  --   HGB 11.4* 12.6  HCT 36.1 37.0  MCV 87.4  --   PLT 241  --    Cardiac Enzymes: No results for input(s): CKTOTAL, CKMB, CKMBINDEX, TROPONINI in the last 168 hours. BNP: BNP (last 3 results) No results for input(s): BNP in the last 8760 hours.  ProBNP (last 3 results) No results for input(s): PROBNP in the last 8760 hours.  CBG: No results for input(s): GLUCAP in the last 168  hours.   Signed:  Velvet Bathe MD.  [facility] 08/11/2015, 12:56 PM

## 2015-08-11 NOTE — Discharge Instructions (Signed)
Transient Ischemic Attack °A transient ischemic attack (TIA) is a "warning stroke" that causes stroke-like symptoms. Unlike a stroke, a TIA does not cause permanent damage to the brain. The symptoms of a TIA can happen very fast and do not last long. It is important to know the symptoms of a TIA and what to do. This can help prevent a major stroke or death. °CAUSES  °A TIA is caused by a temporary blockage in an artery in the brain or neck (carotid artery). The blockage does not allow the brain to get the blood supply it needs and can cause different symptoms. The blockage can be caused by either: °· A blood clot. °· Fatty buildup (plaque) in a neck or brain artery. °RISK FACTORS °· High blood pressure (hypertension). °· High cholesterol. °· Diabetes mellitus. °· Heart disease. °· The buildup of plaque in the blood vessels (peripheral artery disease or atherosclerosis). °· The buildup of plaque in the blood vessels that provide blood and oxygen to the brain (carotid artery stenosis). °· An abnormal heart rhythm (atrial fibrillation). °· Obesity. °· Using any tobacco products, including cigarettes, chewing tobacco, or electronic cigarettes. °· Taking oral contraceptives, especially in combination with using tobacco. °· Physical inactivity. °· A diet high in fats, salt (sodium), and calories. °· Excessive alcohol use. °· Use of illegal drugs (especially cocaine and methamphetamine). °· Being female. °· Being African American. °· Being over the age of 55 years. °· Family history of stroke. °· Previous history of blood clots, stroke, TIA, or heart attack. °· Sickle cell disease. °SIGNS AND SYMPTOMS  °TIA symptoms are the same as a stroke but are temporary. These symptoms usually develop suddenly, or may be newly present upon waking from sleep: °· Sudden weakness or numbness of the face, arm, or leg, especially on one side of the body. °· Sudden trouble walking or difficulty moving arms or legs. °· Sudden  confusion. °· Sudden personality changes. °· Trouble speaking (aphasia) or understanding. °· Difficulty swallowing. °· Sudden trouble seeing in one or both eyes. °· Double vision. °· Dizziness. °· Loss of balance or coordination. °· Sudden severe headache with no known cause. °· Trouble reading or writing. °· Loss of bowel or bladder control. °· Loss of consciousness. °DIAGNOSIS  °Your health care provider may be able to determine the presence or absence of a TIA based on your symptoms, history, and physical exam. CT scan of the brain is usually performed to help identify a TIA. Other tests may include: °· Electrocardiography (ECG). °· Continuous heart monitoring. °· Echocardiography. °· Carotid ultrasonography. °· MRI. °· A scan of the brain circulation. °· Blood tests. °TREATMENT  °Since the symptoms of TIA are the same as a stroke, it is important to seek treatment as soon as possible. You may need a medicine to dissolve a blood clot (thrombolytic) if that is the cause of the TIA. This medicine cannot be given if too much time has passed. Treatment may also include:  °· Rest, oxygen, fluids through an IV tube, and medicines to thin the blood (anticoagulants). °· Measures will be taken to prevent short-term and long-term complications, including infection from breathing foreign material into the lungs (aspiration pneumonia), blood clots in the legs, and falls. °· Procedures to either remove plaque in the carotid arteries or dilate carotid arteries that have narrowed due to plaque. Those procedures are: °¨ Carotid endarterectomy. °¨ Carotid angioplasty and stenting. °· Medicines and diet may be used to address diabetes, high blood pressure, and   other underlying risk factors. °HOME CARE INSTRUCTIONS  °· Take medicines only as directed by your health care provider. Follow the directions carefully. Medicines may be used to control risk factors for a stroke. Be sure you understand all your medicine instructions. °· You  may be told to take aspirin or the anticoagulant warfarin. Warfarin needs to be taken exactly as instructed. °¨ Taking too much or too little warfarin is dangerous. Too much warfarin increases the risk of bleeding. Too little warfarin continues to allow the risk for blood clots. While taking warfarin, you will need to have regular blood tests to measure your blood clotting time. A PT blood test measures how long it takes for blood to clot. Your PT is used to calculate another value called an INR. Your PT and INR help your health care provider to adjust your dose of warfarin. The dose can change for many reasons. It is critically important that you take warfarin exactly as prescribed. °¨ Many foods, especially foods high in vitamin K can interfere with warfarin and affect the PT and INR. Foods high in vitamin K include spinach, kale, broccoli, cabbage, collard and turnip greens, Brussels sprouts, peas, cauliflower, seaweed, and parsley, as well as beef and pork liver, green tea, and soybean oil. You should eat a consistent amount of foods high in vitamin K. Avoid major changes in your diet, or notify your health care provider before changing your diet. Arrange a visit with a dietitian to answer your questions. °¨ Many medicines can interfere with warfarin and affect the PT and INR. You must tell your health care provider about any and all medicines you take; this includes all vitamins and supplements. Be especially cautious with aspirin and anti-inflammatory medicines. Do not take or discontinue any prescribed or over-the-counter medicine except on the advice of your health care provider or pharmacist. °¨ Warfarin can have side effects, such as excessive bruising or bleeding. You will need to hold pressure over cuts for longer than usual. Your health care provider or pharmacist will discuss other potential side effects. °¨ Avoid sports or activities that may cause injury or bleeding. °¨ Be careful when shaving,  flossing your teeth, or handling sharp objects. °¨ Alcohol can change the body's ability to handle warfarin. It is best to avoid alcoholic drinks or consume only very small amounts while taking warfarin. Notify your health care provider if you change your alcohol intake. °¨ Notify your dentist or other health care providers before procedures. °· Eat a diet that includes 5 or more servings of fruits and vegetables each day. This may reduce the risk of stroke. Certain diets may be prescribed to address high blood pressure, high cholesterol, diabetes, or obesity. °¨ A diet low in sodium, saturated fat, trans fat, and cholesterol is recommended to manage high blood pressure. °¨ A diet low in saturated fat, trans fat, and cholesterol, and high in fiber may control cholesterol levels. °¨ A controlled-carbohydrate, controlled-sugar diet is recommended to manage diabetes. °¨ A reduced-calorie diet that is low in sodium, saturated fat, trans fat, and cholesterol is recommended to manage obesity. °· Maintain a healthy weight. °· Stay physically active. It is recommended that you get at least 30 minutes of activity on most or all days. °· Do not use any tobacco products, including cigarettes, chewing tobacco, or electronic cigarettes. If you need help quitting, ask your health care provider. °· Limit alcohol intake to no more than 1 drink per day for nonpregnant women and 2 drinks   per day for men. One drink equals 12 ounces of beer, 5 ounces of wine, or 1 ounces of hard liquor.  Do not abuse drugs.  A safe home environment is important to reduce the risk of falls. Your health care provider may arrange for specialists to evaluate your home. Having grab bars in the bedroom and bathroom is often important. Your health care provider may arrange for equipment to be used at home, such as raised toilets and a seat for the shower.  Follow all instructions for follow-up with your health care provider. This is very important.  This includes any referrals and lab tests. Proper follow-up can prevent a stroke or another TIA from occurring. PREVENTION  The risk of a TIA can be decreased by appropriately treating high blood pressure, high cholesterol, diabetes, heart disease, and obesity, and by quitting smoking, limiting alcohol, and staying physically active. SEEK MEDICAL CARE IF:  You have personality changes.  You have difficulty swallowing.  You are seeing double.  You have dizziness.  You have a fever. SEEK IMMEDIATE MEDICAL CARE IF:  Any of the following symptoms may represent a serious problem that is an emergency. Do not wait to see if the symptoms will go away. Get medical help right away. Call your local emergency services (911 in [place] Do not drive yourself to the hospital.  You have sudden weakness or numbness of the face, arm, or leg, especially on one side of the body.  You have sudden trouble walking or difficulty moving arms or legs.  You have sudden confusion.  You have trouble speaking (aphasia) or understanding.  You have sudden trouble seeing in one or both eyes.  You have a loss of balance or coordination.  You have a sudden, severe headache with no known cause.  You have new chest pain or an irregular heartbeat.  You have a partial or total loss of consciousness. MAKE SURE YOU:   Understand these instructions.  Will watch your condition.  Will get help right away if you are not doing well or get worse.   This information is not intended to replace advice given to you by your health care provider. Make sure you discuss any questions you have with your health care provider.   Document Released: 12/10/2004 Document Revised: 03/23/2014 Document Reviewed: 06/07/2013 Elsevier Interactive Patient Education 2016 [employer]  Atorvastatin tablets What is this medicine? ATORVASTATIN (a TORE va sta tin) is known as a HMG-CoA reductase inhibitor or 'statin'. It lowers the level of  cholesterol and triglycerides in the blood. This drug may also reduce the risk of heart attack, stroke, or other health problems in patients with risk factors for heart disease. Diet and lifestyle changes are often used with this drug. This medicine may be used for other purposes; ask your health care provider or pharmacist if you have questions. What should I tell my health care provider before I take this medicine? They need to know if you have any of these conditions: -frequently drink alcoholic beverages -history of stroke, TIA -kidney disease -liver disease -muscle aches or weakness -other medical condition -an unusual or allergic reaction to atorvastatin, other medicines, foods, dyes, or preservatives -pregnant or trying to get pregnant -breast-feeding How should I use this medicine? Take this medicine by mouth with a glass of water. Follow the directions on the prescription label. You can take this medicine with or without food. Take your doses at regular intervals. Do not take your medicine more often than directed.  Talk to your pediatrician regarding the use of this medicine in children. While this drug may be prescribed for children as young as 35 years old for selected conditions, precautions do apply. Overdosage: If you think you have taken too much of this medicine contact a poison control center or emergency room at once. NOTE: This medicine is only for you. Do not share this medicine with others. What if I miss a dose? If you miss a dose, take it as soon as you can. If it is almost time for your next dose, take only that dose. Do not take double or extra doses. What may interact with this medicine? Do not take this medicine with any of the following medications: -red yeast rice -telaprevir -telithromycin -voriconazole This medicine may also interact with the following medications: -alcohol -antiviral medicines for HIV or AIDS -boceprevir -certain antibiotics like  clarithromycin, erythromycin, troleandomycin -certain medicines for cholesterol like fenofibrate or gemfibrozil -cimetidine -clarithromycin -colchicine -cyclosporine -digoxin -female hormones, like estrogens or progestins and birth control pills -grapefruit juice -medicines for fungal infections like fluconazole, itraconazole, ketoconazole -niacin -rifampin -spironolactone This list may not describe all possible interactions. Give your health care provider a list of all the medicines, herbs, non-prescription drugs, or dietary supplements you use. Also tell them if you smoke, drink alcohol, or use illegal drugs. Some items may interact with your medicine. What should I watch for while using this medicine? Visit your doctor or health care professional for regular check-ups. You may need regular tests to make sure your liver is working properly. Tell your doctor or health care professional right away if you get any unexplained muscle pain, tenderness, or weakness, especially if you also have a fever and tiredness. Your doctor or health care professional may tell you to stop taking this medicine if you develop muscle problems. If your muscle problems do not go away after stopping this medicine, contact your health care professional. This drug is only part of a total heart-health program. Your doctor or a dietician can suggest a low-cholesterol and low-fat diet to help. Avoid alcohol and smoking, and keep a proper exercise schedule. Do not use this drug if you are pregnant or breast-feeding. Serious side effects to an unborn child or to an infant are possible. Talk to your doctor or pharmacist for more information. This medicine may affect blood sugar levels. If you have diabetes, check with your doctor or health care professional before you change your diet or the dose of your diabetic medicine. If you are going to have surgery tell your health care professional that you are taking this drug. What  side effects may I notice from receiving this medicine? Side effects that you should report to your doctor or health care professional as soon as possible: -allergic reactions like skin rash, itching or hives, swelling of the face, lips, or tongue -dark urine -fever -joint pain -muscle cramps, pain -redness, blistering, peeling or loosening of the skin, including inside the mouth -trouble passing urine or change in the amount of urine -unusually weak or tired -yellowing of eyes or skin Side effects that usually do not require medical attention (report to your doctor or health care professional if they continue or are bothersome): -constipation -heartburn -stomach gas, pain, upset This list may not describe all possible side effects. Call your doctor for medical advice about side effects. You may report side effects to FDA at 1-800-FDA-1088. Where should I keep my medicine? Keep out of the reach of children. Store at  room temperature between 20 to 25 degrees C (68 to 77 degrees F). Throw away any unused medicine after the expiration date. NOTE: This sheet is a summary. It may not cover all possible information. If you have questions about this medicine, talk to your doctor, pharmacist, or health care provider.    2016, Elsevier/Gold Standard. (2011-01-20 FT:[id number])

## 2015-08-13 LAB — HEMOGLOBIN A1C
HEMOGLOBIN A1C: 5.9 % — AB (ref 4.8–5.6)
Mean Plasma Glucose: 123 mg/dL

## 2015-11-24 ENCOUNTER — Other Ambulatory Visit: Payer: Self-pay | Admitting: Medical

## 2015-11-25 NOTE — Telephone Encounter (Signed)
Has appt on Thursday for f/u

## 2015-11-28 ENCOUNTER — Ambulatory Visit (INDEPENDENT_AMBULATORY_CARE_PROVIDER_SITE_OTHER): Payer: BLUE CROSS/BLUE SHIELD | Admitting: Medical

## 2015-11-28 ENCOUNTER — Encounter: Payer: Self-pay | Admitting: Medical

## 2015-11-28 VITALS — BP 114/72 | HR 62 | Ht 69.0 in | Wt 271.0 lb

## 2015-11-28 DIAGNOSIS — Z87892 Personal history of anaphylaxis: Secondary | ICD-10-CM

## 2015-11-28 DIAGNOSIS — E785 Hyperlipidemia, unspecified: Secondary | ICD-10-CM

## 2015-11-28 DIAGNOSIS — E049 Nontoxic goiter, unspecified: Secondary | ICD-10-CM

## 2015-11-28 DIAGNOSIS — E669 Obesity, unspecified: Secondary | ICD-10-CM

## 2015-11-28 DIAGNOSIS — F430 Acute stress reaction: Secondary | ICD-10-CM | POA: Insufficient documentation

## 2015-11-28 DIAGNOSIS — E039 Hypothyroidism, unspecified: Secondary | ICD-10-CM | POA: Insufficient documentation

## 2015-11-28 DIAGNOSIS — D500 Iron deficiency anemia secondary to blood loss (chronic): Secondary | ICD-10-CM

## 2015-11-28 DIAGNOSIS — M79605 Pain in left leg: Secondary | ICD-10-CM

## 2015-11-28 DIAGNOSIS — Z23 Encounter for immunization: Secondary | ICD-10-CM | POA: Insufficient documentation

## 2015-11-28 LAB — CBC
HCT: 38 % (ref 35.0–45.0)
HEMOGLOBIN: 12.5 g/dL (ref 11.7–15.5)
MCH: 28.7 pg (ref 27.0–33.0)
MCHC: 32.9 g/dL (ref 32.0–36.0)
MCV: 87.4 fL (ref 80.0–100.0)
MPV: 10.1 fL (ref 7.5–12.5)
Platelets: 271 10*3/uL (ref 140–400)
RBC: 4.35 MIL/uL (ref 3.80–5.10)
RDW: 14 % (ref 11.0–15.0)
WBC: 6 10*3/uL (ref 4.0–10.5)

## 2015-11-28 LAB — IRON AND TIBC
%SAT: 27 % (ref 11–50)
IRON: 95 ug/dL (ref 45–160)
TIBC: 347 ug/dL (ref 250–450)
UIBC: 252 ug/dL (ref 125–400)

## 2015-11-28 LAB — TSH: TSH: 2.1 mIU/L

## 2015-11-28 LAB — T4, FREE: FREE T4: 1.2 ng/dL (ref 0.8–1.8)

## 2015-11-28 MED ORDER — FEXOFENADINE HCL 180 MG PO TABS: 180.0000 mg | ORAL_TABLET | Freq: Every day | ORAL | 3 refills | Status: DC

## 2015-11-28 MED ORDER — EPINEPHRINE 0.3 MG/0.3ML IJ SOAJ: 0.3000 mg | Freq: Once | INTRAMUSCULAR | 1 refills | Status: AC

## 2015-11-28 MED ORDER — ATORVASTATIN CALCIUM 40 MG PO TABS: 40.0000 mg | ORAL_TABLET | Freq: Every day | ORAL | 3 refills | Status: DC

## 2015-11-28 NOTE — Progress Notes (Signed)
Subjective: Chief Complaint  Patient presents with  . Medication Management   Here for med check.   Compliant with statin and thyroid medication without c/o.  Takes Aspirin 81mg  some, not daily, although 325mg  dose was listed in chart.  Needs refill on epi pen to have prn  She notes intermittent left leg pain from hip to lower leg without numbness, tingling, weakness.  Gets some back pain. 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   No injury, no trauma.  She sees a chiropractor regularly who advised her to not sit on the hard floor.  She notes back in May had scare when she felt blurred vision and faint while driving.   Was taken to the ED, had evaluation, echocardiogram, labs, but ultimately was found to be related to stress.  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  Lots of stress.  after she saw a counselor, things got back under control, but had difficult time then.   No other aggravating or relieving factors. No other complaint.   Past Medical History:  Diagnosis Date  . Allergy   . Anemia   . GERD (gastroesophageal reflux disease)   . Goiter   . Hashimoto thyroiditis   . Obesity   . Uterine fibroid    Current Outpatient Prescriptions on File Prior to Visit  Medication Sig Dispense Refill  . CALCIUM PO Take 2 tablets by mouth at bedtime.     . fexofenadine (ALLEGRA) 180 MG tablet Take 1 tablet (180 mg total) by mouth daily. 90 tablet 3  . ibuprofen (ADVIL,MOTRIN) 800 MG tablet Take 1 tablet (800 mg total) by mouth 3 (three) times daily. (Patient taking differently: Take 800 mg by mouth 2 (two) times daily as needed for moderate pain. ) 30 tablet 0  . IRON PO Take 1 tablet by mouth at bedtime as needed (for low iron leves).     Marland Kitchen levothyroxine (SYNTHROID, LEVOTHROID) 100 MCG tablet take 1 tablet by mouth once daily before  BREAKFAST 90 tablet 0  . Multiple Vitamin (MULTIVITAMIN WITH MINERALS) TABS Take 1 tablet by mouth every morning.     Marland Kitchen atorvastatin (LIPITOR) 40 MG tablet Take 1 tablet (40 mg total) by mouth daily at 6 PM. (Patient not taking: Reported on 11/28/2015) 30 tablet 0  . EPINEPHrine (EPIPEN 2-PAK) 0.3 mg/0.3 mL IJ SOAJ injection Inject 0.3 mLs (0.3 mg total) into the muscle once. (Patient not taking: Reported on 11/28/2015) 1 Device 0   No current facility-administered medications on file prior to visit.    Past Surgical History:  Procedure Laterality Date  . CHOLECYSTECTOMY    . COLONOSCOPY  04/2014   normal, Dr. Erskine Emery    ROS as in subjective   Objective: BP 114/72   Pulse 62   Ht 5\' 9"  (1.753 m)   Wt 271 lb (122.9 kg)   BMI 40.02 kg/m   BP Readings from Last 3 Encounters:  11/28/15 114/72  08/11/15 (!) 101/45  06/11/15 110/70   Wt Readings from Last 3 Encounters:  11/28/15 271 lb (122.9 kg)  08/09/15 277 lb 5.4 oz (125.8 kg)  06/11/15 274 lb (124.3 kg)   General appearance: alert, no distress, WD/WN Oral cavity: MMM, no lesions Neck: supple, no lymphadenopathy, goiter, but no distinct nodules, no masses, no bruits  Heart: RRR, normal S1, S2, no murmurs Lungs: CTA bilaterally, no wheezes, rhonchi, or rales Back: non tender, ROM 90% of normal.  Musculoskeletal: legs nontender, normal ROM, non tender, no swelling, no obvious deformity Extremities: no edema, no cyanosis, no clubbing Pulses: 2+ symmetric, upper and lower extremities, normal cap refill Neurological: legs with normal strength, sensation, DTRs 1+ UE and LE, alert, oriented x 3, CN2-12 intact, strength normal upper extremities and lower extremities, sensation normal throughout, DTRs 2+ throughout, no cerebellar signs, gait normal Psychiatric: normal affect, behavior normal, pleasant     Assessment: Encounter Diagnoses  Name Primary?  . Hypothyroidism, unspecified hypothyroidism type Yes  .  Hyperlipidemia   . Iron deficiency anemia due to chronic blood loss   . Acute stress reaction   . Obesity   . History of anaphylaxis   . Need for prophylactic vaccination and inoculation against influenza   . Left leg pain   . Goiter     Plan: Hypothyroidism - labs today, doing fine  Goiter - stable, asymptomatic hyperlipdiemia - labs today, compliant with medication Anemia due to fibroids - recheck labs today Acute stress reaction - she had rough few moths with lots of stress.  reviewed 07/2015 hospital visit, echocardiogram, eval, and she is doing better with stress reduction and with seeing a counselor. Obesity - c/t to work on weight loss goals, healthy diet, routine exercise Left leg pain - likely some DDD of lumbar spine vs sitting on the floor at the day care causing some inflammation.   Advised daily stretching routine and exercise daily. She sees chiropractor regularly.  F/u if worsening or not improving with in a month Counseled on the influenza virus vaccine.  Vaccine information sheet given.  Influenza vaccine given after consent obtained. Refilled Epipen F/u pending labs  Denise Schroeder was seen today for medication management.  Diagnoses and all orders for this visit:  Hypothyroidism, unspecified hypothyroidism type -     TSH -     T4, free  Hyperlipidemia  Iron deficiency anemia due to chronic blood loss -     CBC -     Iron and TIBC  Acute stress reaction  Obesity  History of anaphylaxis  Need for prophylactic vaccination and inoculation against influenza  Left leg pain  Goiter  Other orders -     fexofenadine (ALLEGRA) 180 MG tablet; Take 1 tablet (180 mg total) by mouth daily. -     EPINEPHrine (EPIPEN 2-PAK) 0.3 mg/0.3 mL IJ SOAJ injection; Inject 0.3 mLs (0.3 mg total) into the muscle once. -     atorvastatin (LIPITOR) 40 MG tablet; Take 1 tablet (40 mg total) by mouth daily at 6 PM.

## 2015-11-29 ENCOUNTER — Telehealth: Payer: Self-pay

## 2015-11-29 ENCOUNTER — Other Ambulatory Visit: Payer: Self-pay | Admitting: Medical

## 2015-11-29 MED ORDER — LEVOTHYROXINE SODIUM 100 MCG PO TABS
ORAL_TABLET | ORAL | 3 refills | Status: DC
Start: 2015-11-29 — End: 2016-11-11

## 2015-11-29 NOTE — Telephone Encounter (Signed)
Spoke with pt- made aware of recommendations of cholesterol medication. She does not want to take this medication right now and wants to recheck at next OV.    PT needs refill of EPI-PEN

## 2015-11-29 NOTE — Telephone Encounter (Signed)
Pt called the office to let us know that she did not pick up the atorvastatin because she has not taken this since May. Pt questions if you want her to continue this. Also, needs Epipen called into [facility]. Thanks, RLB  PT # [phone]

## 2015-11-29 NOTE — Telephone Encounter (Signed)
I didn't check the lipids the other day as it was checked in May and it looked ok then.  Not sure if she was taking it then or not.   There is certainly heart benefit to being on statin, however, her numbers at least in the last year for cholesterol haven't been bad.   So either take it daily or if not wanting to take, lets plan fasting lipid lab upon next visit and re-discussed the reasoning why she may or may not need to be on statin.  FYI - she doesn't have diabetes, heart disease, hypertension, and is not a smoker, so lower heart disease risks

## 2016-01-15 ENCOUNTER — Other Ambulatory Visit: Payer: Self-pay | Admitting: Medical

## 2016-01-15 ENCOUNTER — Telehealth: Payer: Self-pay | Admitting: Medical

## 2016-01-15 MED ORDER — FLUCONAZOLE 150 MG PO TABS: 150.0000 mg | ORAL_TABLET | Freq: Once | ORAL | 1 refills | Status: AC

## 2016-01-15 NOTE — Telephone Encounter (Signed)
Find out what symptoms she has?  May need to see her

## 2016-01-15 NOTE — Telephone Encounter (Signed)
Pt says she has a yeast infections with all the classic symptoms. She thinks her vitamins may be causing yeast. Requesting Diflucan.

## 2016-01-15 NOTE — Telephone Encounter (Signed)
She said that she is itching ,burning,and white discharge ,x2 week . She said that she started taking a new vit she think it coming from that

## 2016-03-18 ENCOUNTER — Encounter: Payer: Self-pay | Admitting: Obstetrics and Gynecology

## 2016-05-01 ENCOUNTER — Ambulatory Visit (INDEPENDENT_AMBULATORY_CARE_PROVIDER_SITE_OTHER): Payer: BLUE CROSS/BLUE SHIELD | Admitting: Medical

## 2016-05-01 ENCOUNTER — Encounter: Payer: Self-pay | Admitting: Medical

## 2016-05-01 VITALS — BP 118/72 | HR 70 | Ht 69.5 in | Wt 277.2 lb

## 2016-05-01 DIAGNOSIS — D259 Leiomyoma of uterus, unspecified: Secondary | ICD-10-CM | POA: Diagnosis not present

## 2016-05-01 DIAGNOSIS — Z639 Problem related to primary support group, unspecified: Secondary | ICD-10-CM | POA: Insufficient documentation

## 2016-05-01 DIAGNOSIS — Z87892 Personal history of anaphylaxis: Secondary | ICD-10-CM | POA: Diagnosis not present

## 2016-05-01 DIAGNOSIS — E785 Hyperlipidemia, unspecified: Secondary | ICD-10-CM | POA: Diagnosis not present

## 2016-05-01 DIAGNOSIS — Z131 Encounter for screening for diabetes mellitus: Secondary | ICD-10-CM | POA: Insufficient documentation

## 2016-05-01 DIAGNOSIS — E039 Hypothyroidism, unspecified: Secondary | ICD-10-CM

## 2016-05-01 DIAGNOSIS — Z0000 Encounter for general adult medical examination without abnormal findings: Secondary | ICD-10-CM | POA: Diagnosis not present

## 2016-05-01 DIAGNOSIS — Z7189 Other specified counseling: Secondary | ICD-10-CM

## 2016-05-01 DIAGNOSIS — E049 Nontoxic goiter, unspecified: Secondary | ICD-10-CM

## 2016-05-01 DIAGNOSIS — Z7185 Encounter for immunization safety counseling: Secondary | ICD-10-CM | POA: Insufficient documentation

## 2016-05-01 LAB — POCT URINALYSIS DIPSTICK
BILIRUBIN UA: NEGATIVE
GLUCOSE UA: NEGATIVE
Ketones, UA: NEGATIVE
Leukocytes, UA: NEGATIVE
Nitrite, UA: NEGATIVE
Protein, UA: NEGATIVE
RBC UA: NEGATIVE
UROBILINOGEN UA: NEGATIVE
pH, UA: 6

## 2016-05-01 LAB — TSH: TSH: 0.88 m[IU]/L

## 2016-05-01 LAB — T4, FREE: Free T4: 1.2 ng/dL (ref 0.8–1.8)

## 2016-05-01 NOTE — Progress Notes (Signed)
Subjective:   HPI  Denise Schroeder is a 51 y.o. female who presents for a complete physical.  Medical team: Dr. Ambrose Pancoast, [facility], up to date on pap and mammogram, done through [facility] Dr. Deatra Ina, GI Glade Lloyd, DAVID [facility], PA-C  Concerns: Overall doing fine except with mood, relationship.  Has been in relationship with a man for 20+ years, but recently he married another woman.  However, he tells Denise Schroeder that the woman is ok with his arrangement with [facility] as well, that she really is there to provide him children, but he wants to always have a relationship with Jimena.  This has caused her hurt, yet she still wants to have a relationship with him.   She doesn't want to be alone.   Gets depressed at times.   Reviewed their medical, surgical, family, social, medication, and allergy history and updated chart as appropriate.  Past Medical History:  Diagnosis Date  . Allergy   . Anemia   . GERD (gastroesophageal reflux disease)   . Goiter   . Hashimoto thyroiditis   . Obesity   . Uterine fibroid     Past Surgical History:  Procedure Laterality Date  . CHOLECYSTECTOMY    . COLONOSCOPY  04/2014   normal, Dr. Erskine Emery  . IR GENERIC HISTORICAL  05/31/2014   IR RADIOLOGIST EVAL & MGMT 05/31/2014 [facility] INTERV RAD  . UTERINE FIBROID EMBOLIZATION  2016   Dr. Garwin Brothers    Social History   Social History  . Marital status: Single    Spouse name: N/A  . Number of children: 1  . Years of education: N/A   Occupational History  . Owner of [employer]    Social History Main Topics  . Smoking status: Never Smoker  . Smokeless tobacco: Never Used  . Alcohol use 0.6 oz/week    1 Glasses of wine per week     Comment: Occassionally  . Drug use: No  . Sexual activity: No   Other Topics Concern  . Not on file   Social History Narrative   Owns daycare.   Exercise is intermittent.  Off and on relationship.   04/2016.     Family History  Problem  Relation Age of Onset  . Diabetes Mother   . Hypertension Mother   . Other Father     died in [place] when Denise Schroeder was 51yo  . Hypertension Sister   . Other Brother     died in [place]  . Asthma Son   . Diabetes Maternal Aunt   . Diabetes Maternal Uncle   . Hypertension Sister   . Thyroid disease Sister   . Thyroid disease Sister   . Thyroid disease Sister   . Stroke Neg Hx   . Heart disease Neg Hx   . Hyperlipidemia Neg Hx   . Colon cancer Neg Hx   . Colon polyps Neg Hx   . Kidney disease Neg Hx   . Esophageal cancer Neg Hx   . Gallbladder disease Neg Hx   . Cancer Neg Hx      Current Outpatient Prescriptions:  .  CALCIUM PO, Take 2 tablets by mouth at bedtime. , Disp: , Rfl:  .  fexofenadine (ALLEGRA) 180 MG tablet, Take 1 tablet (180 mg total) by mouth daily., Disp: 90 tablet, Rfl: 3 .  ibuprofen (ADVIL,MOTRIN) 800 MG tablet, Take 1 tablet (800 mg total) by mouth 3 (three) times daily. (Patient taking differently: Take 800 mg by mouth 2 (  two) times daily as needed for moderate pain. ), Disp: 30 tablet, Rfl: 0 .  IRON PO, Take 1 tablet by mouth at bedtime as needed (for low iron leves). , Disp: , Rfl:  .  levothyroxine (SYNTHROID, LEVOTHROID) 100 MCG tablet, take 1 tablet by mouth once daily before BREAKFAST, Disp: 90 tablet, Rfl: 3 .  Multiple Vitamin (MULTIVITAMIN WITH MINERALS) TABS, Take 1 tablet by mouth every morning. , Disp: , Rfl:   Allergies  Allergen Reactions  . Citrus Itching, Swelling and Cough    All fruits cause this reaction  . Latex Hives    Review of Systems Constitutional: -fever, -chills, -sweats, -unexpected weight change, -decreased appetite, -fatigue Allergy: -sneezing, -itching, -congestion Dermatology: -changing moles, --rash, -lumps ENT: -runny nose, -ear pain, -sore throat, -hoarseness, -sinus pain, -teeth pain, - ringing in ears, -hearing loss, -nosebleeds Cardiology: -chest pain, -palpitations, -swelling, -difficulty breathing when lying flat,  -waking up short of breath Respiratory: -cough, -shortness of breath, -difficulty breathing with exercise or exertion, -wheezing, -coughing up blood Gastroenterology: -abdominal pain, -nausea, -vomiting, -diarrhea, -constipation, -blood in stool, -changes in bowel movement, -difficulty swallowing or eating Hematology: -bleeding, -bruising  Musculoskeletal: -joint aches, -muscle aches, -joint swelling, -back pain, -neck pain, -cramping, -changes in gait Ophthalmology: denies vision changes, eye redness, itching, discharge Urology: -burning with urination, -difficulty urinating, -blood in urine, -urinary frequency, -urgency, -incontinence Neurology: -headache, -weakness, -tingling, -numbness, -memory loss, -falls, -dizziness Psychology: +depressed mood, -agitation, -sleep problems     Objective:   Physical Exam  BP 118/72   Pulse 70   Ht 5' 9.5" (1.765 m)   Wt 277 lb 3.2 oz (125.7 kg)   LMP 04/24/2016   SpO2 97%   BMI 40.35 kg/m   General appearance: alert, no distress, WD/WN, AA female Skin: right nasal fold with unchanged brown raised 1cm lesion unchanged for years, no worrisome lesions HEENT: normocephalic, conjunctiva/corneas normal, sclerae anicteric, PERRLA, EOMi, nares patent, no discharge or erythema, pharynx normal Oral cavity: MMM, tongue normal, teeth in good repair Neck: supple, no lymphadenopathy, no thyromegaly, no masses, normal ROM,no bruits Chest: non tender, normal shape and expansion Heart: RRR, normal S1, S2, no murmurs Lungs: CTA bilaterally, no wheezes, rhonchi, or rales Abdomen: +bs, soft, non tender, non distended, no masses, no hepatomegaly, no splenomegaly, no bruits Back: non tender, normal ROM, no scoliosis Musculoskeletal: upper extremities non tender, no obvious deformity, normal ROM throughout, lower extremities non tender, no obvious deformity, normal ROM throughout Extremities: no edema, no cyanosis, no clubbing Pulses: 2+ symmetric, upper and lower  extremities, normal cap refill Neurological: alert, oriented x 3, CN2-12 intact, strength normal upper extremities and lower extremities, sensation normal throughout, DTRs 2+ throughout, no cerebellar signs, gait normal Psychiatric: normal affect, behavior normal, pleasant  Breast/gyn/rectal - deferred to gyn   Assessment and Plan :    Encounter Diagnoses  Name Primary?  . Routine general medical examination at a health care facility Yes  . Goiter   . Hypothyroidism, unspecified type   . Uterine leiomyoma, unspecified location   . History of anaphylaxis   . Hyperlipidemia, unspecified hyperlipidemia type   . Vaccine counseling   . Relationship problems      Physical exam - discussed healthy lifestyle, diet, exercise, preventative care, vaccinations, and addressed their concerns.  Reviewed labs from 11/2015 Additional labs today F/u with gynecology  She reports being UTD on gyn visit, pap, mammogram  relationship problems - counseled on her issues, c/t counseling, advised she focus on herself, advised she  break off the relationship with this controlling person that continues to drag her along despite being married to another women.   Spent most of the visit counseling on her relationship and stress issues.   Follow-up pending labs  Kaydance was seen today for physical.  Diagnoses and all orders for this visit:  Routine general medical examination at a health care facility -     Urinalysis Dipstick -     TSH -     T4, free -     Hemoglobin A1c  Goiter -     TSH -     T4, free  Hypothyroidism, unspecified type -     TSH -     T4, free  Uterine leiomyoma, unspecified location  History of anaphylaxis  Hyperlipidemia, unspecified hyperlipidemia type  Vaccine counseling  Relationship problems

## 2016-05-02 LAB — HEMOGLOBIN A1C
Hgb A1c MFr Bld: 5 % (ref <5.7)
MEAN PLASMA GLUCOSE: 97 mg/dL

## 2016-11-09 NOTE — Telephone Encounter (Signed)
This encounter was created in error - please disregard.

## 2016-11-10 ENCOUNTER — Ambulatory Visit (INDEPENDENT_AMBULATORY_CARE_PROVIDER_SITE_OTHER): Payer: BLUE CROSS/BLUE SHIELD | Admitting: Medical

## 2016-11-10 ENCOUNTER — Encounter: Payer: Self-pay | Admitting: Medical

## 2016-11-10 VITALS — BP 134/80 | HR 70 | Wt 278.4 lb

## 2016-11-10 DIAGNOSIS — N898 Other specified noninflammatory disorders of vagina: Secondary | ICD-10-CM | POA: Diagnosis not present

## 2016-11-10 DIAGNOSIS — Z7185 Encounter for immunization safety counseling: Secondary | ICD-10-CM

## 2016-11-10 DIAGNOSIS — Z23 Encounter for immunization: Secondary | ICD-10-CM

## 2016-11-10 DIAGNOSIS — E039 Hypothyroidism, unspecified: Secondary | ICD-10-CM

## 2016-11-10 DIAGNOSIS — Z7189 Other specified counseling: Secondary | ICD-10-CM

## 2016-11-10 LAB — CBC
HEMATOCRIT: 37 % (ref 35.0–45.0)
Hemoglobin: 12.1 g/dL (ref 11.7–15.5)
MCH: 28.4 pg (ref 27.0–33.0)
MCHC: 32.7 g/dL (ref 32.0–36.0)
MCV: 86.9 fL (ref 80.0–100.0)
MPV: 9.7 fL (ref 7.5–12.5)
Platelets: 268 10*3/uL (ref 140–400)
RBC: 4.26 MIL/uL (ref 3.80–5.10)
RDW: 14.3 % (ref 11.0–15.0)
WBC: 5.9 10*3/uL (ref 4.0–10.5)

## 2016-11-10 LAB — POCT URINALYSIS DIP (PROADVANTAGE DEVICE)
BILIRUBIN UA: NEGATIVE
Blood, UA: NEGATIVE
GLUCOSE UA: NEGATIVE mg/dL
Ketones, POC UA: NEGATIVE mg/dL
LEUKOCYTES UA: NEGATIVE
NITRITE UA: NEGATIVE
Protein Ur, POC: NEGATIVE mg/dL
Specific Gravity, Urine: 1.03
Urobilinogen, Ur: NEGATIVE
pH, UA: 6 (ref 5.0–8.0)

## 2016-11-10 NOTE — Patient Instructions (Signed)
Recommendations: I recommend you have a shingles vaccine to help prevent shingles or herpes zoster outbreak.   Please call your insurer to inquire about coverage for the Shingrix vaccine given in 2 doses.   Some insurers cover this vaccine after age 51, some cover this after age 73.  If your insurer covers this, then call to schedule appointment to have this vaccine here.  I recommend they exercise regularly such as 30 minute or more 5+ days per week I recommend they eat 3-4 fruit servings daily, vegetables every meal, and get whole grains in the diet such as 1 cup serving of brown rice or whole grain pasta, or barley, or other whole grain, or 2 slices of whole grain bread at a meal I recommend they get health fats in the diet such as almonds, avocado, fish, and olive oil for example I recommend they cut back on meat and dairy.  We don't need meat every meal or even every day.   Meat is general is high in calories and cholesterol I recommend they significantly limit animal fat, sweets and baked goods such as cookies, chips, donuts, cakes, and pies  Limit butter, margarine, ice cream, and other unhealthy snack foods  Consider Weight Watchers program

## 2016-11-10 NOTE — Progress Notes (Signed)
Subjective: Chief Complaint  Patient presents with  . med check and thyroid    med check and check thyroid level   Here for med check.  Doing fine in general.   Busy with work.  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   Has long busy work days.  Not exercising as much lately. otherwise in normal state of health.  Compliant with thyroid medication.  She drinks cucumber alkaline water daily but notices when she drinks it gets a film or vaginal discharge on toilet paper or panty liner.  However when she is not drinking this, doesn't get the film.   No other aggravating or relieving factors. No other complaint.  Past Medical History:  Diagnosis Date  . Allergy   . Anemia   . GERD (gastroesophageal reflux disease)   . Goiter   . Hashimoto thyroiditis   . Obesity   . Uterine fibroid    Current Outpatient Prescriptions on File Prior to Visit  Medication Sig Dispense Refill  . CALCIUM PO Take 2 tablets by mouth at bedtime.     . fexofenadine (ALLEGRA) 180 MG tablet Take 1 tablet (180 mg total) by mouth daily. 90 tablet 3  . ibuprofen (ADVIL,MOTRIN) 800 MG tablet Take 1 tablet (800 mg total) by mouth 3 (three) times daily. (Patient taking differently: Take 800 mg by mouth 2 (two) times daily as needed for moderate pain. ) 30 tablet 0  . IRON PO Take 1 tablet by mouth at bedtime as needed (for low iron leves).     Marland Kitchen levothyroxine (SYNTHROID, LEVOTHROID) 100 MCG tablet take 1 tablet by mouth once daily before BREAKFAST 90 tablet 3  . Multiple Vitamin (MULTIVITAMIN WITH MINERALS) TABS Take 1 tablet by mouth every morning.      No current facility-administered medications on file prior to visit.    ROS as in subjective   Objective BP 134/80   Pulse 70   Wt 278 lb 6.4 oz (126.3 kg)   SpO2 98%   BMI 40.52 kg/m   Wt Readings from Last 3 Encounters:  11/10/16 278 lb 6.4 oz (126.3 kg)  05/01/16 277 lb 3.2 oz (125.7 kg)  11/28/15 271 lb (122.9 kg)    General  appearance: alert, no distress, WD/WN,  oral cavity: MMM, no lesions Neck: supple, no lymphadenopathy, no thyromegaly, no masses Heart: RRR, normal S1, S2, no murmurs Lungs: CTA bilaterally, no wheezes, rhonchi, or rales Abdomen: +bs, soft, non tender, non distended, no masses, no hepatomegaly, no splenomegaly Back: non tender Musculoskeletal: non tender, no swelling, no obvious deformity Extremities: no edema, no cyanosis, no clubbing Pulses: 2+ symmetric, upper and lower extremities, normal cap refill Neurological: alert, oriented x 3, CN2-12 intact, strength normal upper extremities and lower extremities, sensation normal throughout, DTRs 2+ throughout, no cerebellar signs, gait normal Psychiatric: normal affect, behavior normal, pleasant    Assessment: Encounter Diagnoses  Name Primary?  . Hypothyroidism, unspecified type Yes  . Morbid obesity ([place])   . Need for influenza vaccination   . Vaccine counseling     Plan: Routine labs today.  C/t same thyroid medication.   Counseled on weight, gave diet and exercise recommendations, consider weight watchers program  Counseled on the influenza virus vaccine.  Vaccine information sheet given.  Influenza vaccine given after consent obtained.  I recommend you have a shingles vaccine to help prevent shingles or herpes zoster outbreak.   Please call your insurer to inquire about coverage for the  Shingrix vaccine given in 2 doses.   Some insurers cover this vaccine after age 68, some cover this after age 51.  If your insurer covers this, then call to schedule appointment to have this vaccine here.   Rever was seen today for med check and thyroid.  Diagnoses and all orders for this visit:  Hypothyroidism, unspecified type -     TSH -     CBC -     T4, free  Morbid obesity ([place]) -     Hemoglobin A1c  Need for influenza vaccination  Vaccine counseling

## 2016-11-10 NOTE — Addendum Note (Signed)
Addended by: Tyrone Apple on: 11/10/2016 10:19 AM   Modules accepted: Orders

## 2016-11-11 ENCOUNTER — Other Ambulatory Visit: Payer: Self-pay | Admitting: Medical

## 2016-11-11 LAB — HEMOGLOBIN A1C
Hgb A1c MFr Bld: 4.9 % (ref <5.7)
Mean Plasma Glucose: 94 mg/dL

## 2016-11-11 LAB — TSH: TSH: 1.66 mIU/L

## 2016-11-11 LAB — T4, FREE: Free T4: 1.4 ng/dL (ref 0.8–1.8)

## 2016-11-11 MED ORDER — LEVOTHYROXINE SODIUM 100 MCG PO TABS: ORAL_TABLET | ORAL | 3 refills | Status: DC

## 2016-11-15 ENCOUNTER — Encounter (HOSPITAL_BASED_OUTPATIENT_CLINIC_OR_DEPARTMENT_OTHER): Payer: Self-pay | Admitting: Emergency Medicine

## 2016-11-15 ENCOUNTER — Emergency Department (HOSPITAL_BASED_OUTPATIENT_CLINIC_OR_DEPARTMENT_OTHER): Payer: BLUE CROSS/BLUE SHIELD

## 2016-11-15 ENCOUNTER — Emergency Department (HOSPITAL_BASED_OUTPATIENT_CLINIC_OR_DEPARTMENT_OTHER)
Admission: EM | Admit: 2016-11-15 | Discharge: 2016-11-15 | Disposition: A | Discharge status: Home / Self Care | Payer: BLUE CROSS/BLUE SHIELD | Attending: Emergency Medicine | Admitting: Emergency Medicine

## 2016-11-15 DIAGNOSIS — S39012A Strain of muscle, fascia and tendon of lower back, initial encounter: Secondary | ICD-10-CM | POA: Diagnosis not present

## 2016-11-15 DIAGNOSIS — M545 Low back pain, unspecified: Secondary | ICD-10-CM

## 2016-11-15 DIAGNOSIS — M25561 Pain in right knee: Secondary | ICD-10-CM | POA: Diagnosis present

## 2016-11-15 DIAGNOSIS — Z79899 Other long term (current) drug therapy: Secondary | ICD-10-CM | POA: Diagnosis not present

## 2016-11-15 DIAGNOSIS — Y929 Unspecified place or not applicable: Secondary | ICD-10-CM | POA: Insufficient documentation

## 2016-11-15 DIAGNOSIS — Y9301 Activity, walking, marching and hiking: Secondary | ICD-10-CM | POA: Diagnosis not present

## 2016-11-15 DIAGNOSIS — M25562 Pain in left knee: Secondary | ICD-10-CM | POA: Insufficient documentation

## 2016-11-15 DIAGNOSIS — Y999 Unspecified external cause status: Secondary | ICD-10-CM | POA: Diagnosis not present

## 2016-11-15 DIAGNOSIS — E039 Hypothyroidism, unspecified: Secondary | ICD-10-CM | POA: Insufficient documentation

## 2016-11-15 DIAGNOSIS — T148XXA Other injury of unspecified body region, initial encounter: Secondary | ICD-10-CM

## 2016-11-15 DIAGNOSIS — W0110XA Fall on same level from slipping, tripping and stumbling with subsequent striking against unspecified object, initial encounter: Secondary | ICD-10-CM | POA: Diagnosis not present

## 2016-11-15 DIAGNOSIS — W19XXXA Unspecified fall, initial encounter: Secondary | ICD-10-CM

## 2016-11-15 MED ORDER — CYCLOBENZAPRINE HCL 10 MG PO TABS: 10.0000 mg | ORAL_TABLET | Freq: Two times a day (BID) | ORAL | 0 refills | Status: DC | PRN

## 2016-11-15 NOTE — ED Triage Notes (Signed)
Pt slipped and fell on tile floor yesterday at the nail salon. Denies LOC or hitting head. Pt c/o bilat knee and back pain

## 2016-11-15 NOTE — Discharge Instructions (Signed)
Please read and follow all provided instructions.  Your diagnoses today include:  1. Acute pain of both knees   2. Fall, initial encounter   3. Acute left-sided low back pain without sciatica   4. Muscle strain     Tests performed today include: Vital signs. See below for your results today.   Medications prescribed:  Take as prescribed   Home care instructions:  Follow any educational materials contained in this packet.  Follow-up instructions: Please follow-up with your primary care provider for further evaluation of symptoms and treatment   Return instructions:  Please return to the Emergency Department if you do not get better, if you get worse, or new symptoms OR  - Fever (temperature greater than 101.50F)  - Bleeding that does not stop with holding pressure to the area    -Severe pain (please note that you may be more sore the day after your accident)  - Chest Pain  - Difficulty breathing  - Severe nausea or vomiting  - Inability to tolerate food and liquids  - Passing out  - Skin becoming red around your wounds  - Change in mental status (confusion or lethargy)  - New numbness or weakness    Please return if you have any other emergent concerns.  Additional Information:  Your vital signs today were: BP 102/60 (BP Location: Right Arm)    Pulse 62    Temp 98.1 F (36.7 C) (Oral)    Resp 18    Ht 5\' 8"  (1.727 m)    Wt 124.3 kg (274 lb)    SpO2 100%    BMI 41.66 kg/m  If your blood pressure (BP) was elevated above 135/85 this visit, please have this repeated by your doctor within one month. ---------------

## 2016-11-15 NOTE — ED Notes (Signed)
Patient transported to X-ray 

## 2016-11-15 NOTE — ED Notes (Signed)
Patient given instructions for bilateral knee sleeves.

## 2016-11-15 NOTE — ED Provider Notes (Signed)
[place] [facility]PT [facility] Provider Note   CSN: [id number] Arrival date & time: 11/15/16  1044     History   Chief Complaint Chief Complaint  Patient presents with  . Fall    HPI Denise Schroeder is a 51 y.o. female.  HPI  51 y.o. female, presents to the Emergency Department today due to mechanical fall. Noted slipping and falling on tile floor at nail salon. No head trauma or LOC. Notes direct impact to bilateral knees. Notes pain with ambulation. Rates pain 4/10. Aching sensation. Minimal pain at rest. Also noted left lower back pain due to impact with wall. No loss of bowel or bladder function. No saddle anesthesia. No numbness/tingling. No other symptoms noted.    Past Medical History:  Diagnosis Date  . Allergy   . Anemia   . GERD (gastroesophageal reflux disease)   . Goiter   . Hashimoto thyroiditis   . Obesity   . Uterine fibroid     Patient Active Problem List   Diagnosis Date Noted  . Routine general medical examination at a health care facility 05/01/2016  . Vaccine counseling 05/01/2016  . Relationship problems 05/01/2016  . Morbid obesity ([place]) 11/28/2015  . Hypothyroidism 11/28/2015  . History of anaphylaxis 11/28/2015  . Goiter 11/28/2015  . Need for prophylactic vaccination and inoculation against influenza 11/28/2015  . Hyperlipidemia   . Paresthesia 08/09/2015  . Uterine fibroid 06/12/2014  . Anemia 04/27/2014    Past Surgical History:  Procedure Laterality Date  . CHOLECYSTECTOMY    . COLONOSCOPY  04/2014   normal, Dr. Erskine Emery  . IR GENERIC HISTORICAL  05/31/2014   IR RADIOLOGIST EVAL & MGMT 05/31/2014 [facility] INTERV RAD  . UTERINE FIBROID EMBOLIZATION  2016   Dr. Garwin Brothers    OB History    No data available       Home Medications    Prior to Admission medications   Medication Sig Start Date End Date Taking? Authorizing Provider  CALCIUM PO Take 2 tablets by mouth at bedtime.     [provider]  fexofenadine (ALLEGRA)  180 MG tablet Take 1 tablet (180 mg total) by mouth daily. 11/28/15   Tysinger, Camelia Eng, PA-C  ibuprofen (ADVIL,MOTRIN) 800 MG tablet Take 1 tablet (800 mg total) by mouth 3 (three) times daily. Patient taking differently: Take 800 mg by mouth 2 (two) times daily as needed for moderate pain.  06/13/14   Ascencion Dike, PA-C  IRON PO Take 1 tablet by mouth at bedtime as needed (for low iron leves).     [provider]  levothyroxine (SYNTHROID, LEVOTHROID) 100 MCG tablet take 1 tablet by mouth once daily before BREAKFAST 11/11/16   Tysinger, Camelia Eng, PA-C  Multiple Vitamin (MULTIVITAMIN WITH MINERALS) TABS Take 1 tablet by mouth every morning.     [provider]    Family History Family History  Problem Relation Age of Onset  . Diabetes Mother   . Hypertension Mother   . Other Father        died in [place] when Gittel was 51yo  . Hypertension Sister   . Other Brother        died in [place]  . Asthma Son   . Diabetes Maternal Aunt   . Diabetes Maternal Uncle   . Hypertension Sister   . Thyroid disease Sister   . Thyroid disease Sister   . Thyroid disease Sister   . Stroke Neg Hx   . Heart disease Neg Hx   .  Hyperlipidemia Neg Hx   . Colon cancer Neg Hx   . Colon polyps Neg Hx   . Kidney disease Neg Hx   . Esophageal cancer Neg Hx   . Gallbladder disease Neg Hx   . Cancer Neg Hx     Social History Social History  Substance Use Topics  . Smoking status: Never Smoker  . Smokeless tobacco: Never Used  . Alcohol use 0.6 oz/week    1 Glasses of wine per week     Comment: Occassionally     Allergies   Citrus and Latex   Review of Systems Review of Systems  Constitutional: Negative for fever.  Gastrointestinal: Negative for nausea.  Musculoskeletal: Positive for arthralgias and joint swelling.  Skin: Negative for wound.  Neurological: Negative for numbness.     Physical Exam Updated Vital Signs BP 102/60 (BP Location: Right Arm)   Pulse 62   Temp 98.1 F  (36.7 C) (Oral)   Resp 18   Ht 5\' 8"  (1.727 m)   Wt 124.3 kg (274 lb)   SpO2 100%   BMI 41.66 kg/m   Physical Exam  Constitutional: She is oriented to person, place, and time. Vital signs are normal. She appears well-developed and well-nourished.  HENT:  Head: Normocephalic.  Right Ear: Hearing normal.  Left Ear: Hearing normal.  Eyes: Pupils are equal, round, and reactive to light. Conjunctivae and EOM are normal.  Cardiovascular: Normal rate and regular rhythm.   Pulmonary/Chest: Effort normal.  Musculoskeletal:  TTP right lower lumbar musculature. No midline spinous process tenderness. No palpable or visible deformities.  Bilateral knees TTP proximal tibia. No palpable deformities. ROM intact. Ambulates with mild difficulty. NVI  Neurological: She is alert and oriented to person, place, and time.  Skin: Skin is warm and dry.  Psychiatric: She has a normal mood and affect. Her speech is normal and behavior is normal. Thought content normal.  Nursing note and vitals reviewed.    ED Treatments / Results  Labs (all labs ordered are listed, but only abnormal results are displayed) Labs Reviewed - No data to display  EKG  EKG Interpretation None       Radiology Dg Knee Complete 4 Views Left  Result Date: 11/15/2016 CLINICAL DATA:  Slipped and fell yesterday on tile floor at the knee also lawn. Bilateral knee pain. EXAM: LEFT KNEE - COMPLETE 4+ VIEW COMPARISON:  Right knee radiographs of the same day. FINDINGS: Mild tricompartmental degenerative changes are present in the left knee. There is no significant effusion. No acute bone or soft tissue abnormality is present. IMPRESSION: 1. Mild tricompartmental degenerative changes. 2. No acute abnormality. Electronically Signed   By: San Morelle M.D.   On: 11/15/2016 12:28   Dg Knee Complete 4 Views Right  Result Date: 11/15/2016 CLINICAL DATA:  Fall yesterday, bilateral knee pain. EXAM: RIGHT KNEE - COMPLETE 4+ VIEW  COMPARISON:  None. FINDINGS: Tricompartmental spurring with loss of articular space severe in the patellofemoral compartment. Irregular intercondylar notch and tibial spine spurring. Trace knee effusion. No well-defined fracture. IMPRESSION: 1. No acute bony findings. 2. Osteoarthritis, most severe in the patellofemoral joint. 3. Trace knee effusion. If pain persists despite conservative therapy, MRI may be warranted for further characterization. Electronically Signed   By: Van Clines M.D.   On: 11/15/2016 12:28    Procedures Procedures (including critical care time)  Medications Ordered in ED Medications - No data to display   Initial Impression / Assessment and Plan / ED Course  I have reviewed the triage vital signs and the nursing notes.  Pertinent labs & imaging results that were available during my care of the patient were reviewed by me and considered in my medical decision making (see chart for details).  Final Clinical Impressions(s) / ED Diagnoses   {I have reviewed and evaluated the relevant imaging studies.  {I have reviewed the relevant previous healthcare records.  {I obtained HPI from historian.   ED Course:  Assessment: Patient X-Ray negative for obvious fracture or dislocation. Degenerative changes noted in knee. Given sleeves.Given ortho referral. Pt advised to follow up with PCP. Conservative therapy recommended and discussed. Patient will be discharged home & is agreeable with above plan. Returns precautions discussed. Pt appears safe for discharge  Disposition/Plan:  DC Home Additional Verbal discharge instructions given and discussed with patient.  Pt Instructed to f/u with PCP in the next week for evaluation and treatment of symptoms. Return precautions given Pt acknowledges and agrees with plan  Supervising Physician Forde Dandy, MD  Final diagnoses:  Acute pain of both knees  Fall, initial encounter  Acute left-sided low back pain without sciatica    Muscle strain    New Prescriptions New Prescriptions   No medications on file     Shary Decamp, PA-C 11/15/16 1253    Forde Dandy, MD 11/15/16 [phone]

## 2017-02-11 ENCOUNTER — Ambulatory Visit: Payer: BLUE CROSS/BLUE SHIELD | Admitting: Medical

## 2017-02-11 ENCOUNTER — Encounter: Payer: Self-pay | Admitting: Medical

## 2017-02-11 ENCOUNTER — Ambulatory Visit
Admission: RE | Admit: 2017-02-11 | Discharge: 2017-02-11 | Disposition: A | Discharge status: Home / Self Care | Payer: BLUE CROSS/BLUE SHIELD | Source: Ambulatory Visit | Attending: Medical | Admitting: Medical

## 2017-02-11 VITALS — BP 110/78 | HR 68 | Wt 280.4 lb

## 2017-02-11 DIAGNOSIS — R0602 Shortness of breath: Secondary | ICD-10-CM

## 2017-02-11 DIAGNOSIS — R062 Wheezing: Secondary | ICD-10-CM

## 2017-02-11 DIAGNOSIS — Z77120 Contact with and (suspected) exposure to mold (toxic): Secondary | ICD-10-CM | POA: Diagnosis not present

## 2017-02-11 LAB — POC INFLUENZA A&B (BINAX/QUICKVUE)
INFLUENZA A, POC: NEGATIVE
INFLUENZA B, POC: NEGATIVE

## 2017-02-11 MED ORDER — ALBUTEROL SULFATE (2.5 MG/3ML) 0.083% IN NEBU
2.5000 mg | INHALATION_SOLUTION | Freq: Once | RESPIRATORY_TRACT | Status: AC
  Administered 2017-02-11: 2.5 mg via RESPIRATORY_TRACT

## 2017-02-11 MED ORDER — MOMETASONE FURO-FORMOTEROL FUM 100-5 MCG/ACT IN AERO: 2.0000 | INHALATION_SPRAY | Freq: Two times a day (BID) | RESPIRATORY_TRACT | 0 refills | Status: DC

## 2017-02-11 MED ORDER — ALBUTEROL SULFATE 108 (90 BASE) MCG/ACT IN AEPB: 2.0000 | INHALATION_SPRAY | RESPIRATORY_TRACT | 0 refills | Status: DC | PRN

## 2017-02-11 NOTE — Progress Notes (Signed)
Subjective: Chief Complaint  Patient presents with  . shortness of breath    shortness of breath , coughing x1 week    Here for c/o cough, SOB.  Yesterday called EMS to house, EKG was done, normal, and it was thought her symptoms were respiratory related.   She was not transported nor given breathing treatment.   She denies hx/o asthma but says Maudie Mercury PA here in the past gave her breathing treatments for similar long time ago.  Currently she reports few day hx/o cough, sob, wheezing.   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   She cleaned a house yesterday without a mask and later realized there was mold present.   She also used bleach at one point yesterday that worsened her symptoms.   She was fine outside, but not in side the house.    Yesterday felt feverish, body aches, chills, felt a little this way this morning.  She denies, sore throat, ear pain, nausea, vomiting, diarrhea.   Denies sick contacts, but she does own a day care.   No hemoptysis.   No chest pain, no edema.   ROS as in subjective  Past Medical History:  Diagnosis Date  . Allergy   . Anemia   . GERD (gastroesophageal reflux disease)   . Goiter   . Hashimoto thyroiditis   . Obesity   . Uterine fibroid    Current Outpatient Medications on File Prior to Visit  Medication Sig Dispense Refill  . CALCIUM PO Take 2 tablets by mouth at bedtime.     Marland Kitchen ibuprofen (ADVIL,MOTRIN) 800 MG tablet Take 1 tablet (800 mg total) by mouth 3 (three) times daily. (Patient taking differently: Take 800 mg by mouth 2 (two) times daily as needed for moderate pain. ) 30 tablet 0  . IRON PO Take 1 tablet by mouth at bedtime as needed (for low iron leves).     Marland Kitchen levothyroxine (SYNTHROID, LEVOTHROID) 100 MCG tablet take 1 tablet by mouth once daily before BREAKFAST 90 tablet 3  . Multiple Vitamin (MULTIVITAMIN WITH MINERALS) TABS Take 1 tablet by mouth every morning.      No current  facility-administered medications on file prior to visit.      Objective: BP 110/78   Pulse 68   Wt 280 lb 6.4 oz (127.2 kg)   SpO2 98%   BMI 42.63 kg/m   General appearance: alert, no distress, WD/WN HEENT: normocephalic, sclerae anicteric, TMs pearly, nares patent, clear discharge, minimal erythema, pharynx normal Oral cavity: MMM, no lesions Neck: supple, no lymphadenopathy, no thyromegaly, no masses Heart: RRR, normal S1, S2, no murmurs Lungs: decreased breath sounds in general, but no other wheezes, rhonchi, or rales Abdomen: +bs, soft, non tender, non distended, no masses, no hepatomegaly, no splenomegaly Pulses: 2+ symmetric, upper and lower extremities, normal cap refill Ext: no edema  Reviewed EKG from EMS yesterday showing poor R wave progression but otherwise WNL Reviewed 2017 EKG.   The new EKG yesterday shows poor R wave progression but no other new findings.    Assessment: Encounter Diagnoses  Name Primary?  . SOB (shortness of breath) Yes  . Wheezing   . Mold exposure       Plan: After initial eval, gave 1 round of albuterol nebulized treatment.  Lungs were more clear and better audible sounds throughout after neb treatment.   She did feel a little lightheaded after.  Flu test negative  Discussed symptoms, treatment recommendations.  I suspect combination of cold temperatures in the 20s, mold exposure, and chemical exposure (bleach) may have causes a pneumonitis.    Go for Chest xray.    advised rest, hydration, medications below. Discussed proper use of medications   Sharen was seen today for shortness of breath.  Diagnoses and all orders for this visit:  SOB (shortness of breath) -     Pulse oximetry (single); Future -     DG Chest 2 View; Future  Wheezing -     Pulse oximetry (single); Future -     DG Chest 2 View; Future  Mold exposure  Other orders -     Albuterol Sulfate (PROAIR RESPICLICK) 330 (90 Base) MCG/ACT AEPB; Inhale 2 puffs  into the lungs every 4 (four) hours as needed. -     mometasone-formoterol (DULERA) 100-5 MCG/ACT AERO; Inhale 2 puffs into the lungs 2 (two) times daily.

## 2017-02-11 NOTE — Addendum Note (Signed)
Addended by: Tyrone Apple on: 02/11/2017 10:38 AM   Modules accepted: Orders

## 2017-05-11 ENCOUNTER — Ambulatory Visit: Payer: BLUE CROSS/BLUE SHIELD | Admitting: Medical

## 2017-05-11 ENCOUNTER — Encounter: Payer: Self-pay | Admitting: Medical

## 2017-05-11 VITALS — BP 110/78 | HR 71 | Ht 68.0 in | Wt 288.8 lb

## 2017-05-11 DIAGNOSIS — D259 Leiomyoma of uterus, unspecified: Secondary | ICD-10-CM | POA: Diagnosis not present

## 2017-05-11 DIAGNOSIS — D509 Iron deficiency anemia, unspecified: Secondary | ICD-10-CM

## 2017-05-11 DIAGNOSIS — Z7185 Encounter for immunization safety counseling: Secondary | ICD-10-CM

## 2017-05-11 DIAGNOSIS — Z0000 Encounter for general adult medical examination without abnormal findings: Secondary | ICD-10-CM | POA: Diagnosis not present

## 2017-05-11 DIAGNOSIS — E785 Hyperlipidemia, unspecified: Secondary | ICD-10-CM

## 2017-05-11 DIAGNOSIS — E039 Hypothyroidism, unspecified: Secondary | ICD-10-CM | POA: Diagnosis not present

## 2017-05-11 DIAGNOSIS — E049 Nontoxic goiter, unspecified: Secondary | ICD-10-CM | POA: Diagnosis not present

## 2017-05-11 DIAGNOSIS — Z7189 Other specified counseling: Secondary | ICD-10-CM | POA: Diagnosis not present

## 2017-05-11 NOTE — Patient Instructions (Addendum)
Thanks for trusting [place] with your health care and for coming in for a physical today.  Below are some general recommendations I have for you:  Yearly screenings See your eye doctor yearly for routine vision care. See your dentist yearly for routine dental care including hygiene visits twice yearly. See me here yearly for a routine physical and preventative care visit  Immunizations "vaccines"  I recommend you have a yearly influenza vaccine typically in the fall such as September yearly to prevent the flu  I recommend you have a shingles vaccine to help prevent shingles or herpes zoster outbreak.   Please call your insurer to inquire about coverage for the Shingrix vaccine given in 2 doses.   Some insurers cover this vaccine after age 79, some cover this after age 59.  If your insurer covers this, then call to schedule appointment to have this vaccine here.   Cancer screening  Colon cancer screening - you are not due for colonoscopy at this time but you are due for stool cards.  Please use the 3 cards given today to smear some poop/fecal matter on each card separately from 3 separate days.  Please mail them back so we can check them for blood  Breast cancer screening - schedule your mammogram yearly  Cervical cancer screening - you should have a pap smear every 3 years unless your gynecologist instructs you otherwise  Call insurance about doing a thyroid ultrasound given the goiter, which feels stable to me   Specific Concerns today: I recommend setting some fitness or weight loss goals.  Breast tenderness - no obvious lump or mass.  Advised she follow up with gynecologist  Consider weight watchers program, [employer], or if interested I can refer you to a weight loss study that is available currently  I recommend exercising most days of the week using a type of exercise that they would enjoy and stick to such as walking, running, swimming, hiking, biking, aerobics, etc.  I  recommend a healthy diet.    Do's:   whole grains such as whole grain pasta, rice, whole grains breads and whole grain cereals.  Use small quantities such as 1/2 cup per serving or 2 slices of bread per serving.    Eat 3-5 fruits daily  Eat beans at least once daily  Eat almonds in small quantities at least 3 days per week    If they eat meat, I recommend small portions of lean meats such as chicken, fish, and [place].  Eat as much NON corn and NON potato vegetables as they like, particularly raw or steamed  Drink several large glasses of water daily  Cautions:  Limit red meat  Limit corn and potatoes  Limit sweets, cake, pie, candy  Limit beer and alcohol  Avoid fried food, fast food, large portions  Avoid sugary drinks such as regular soda and sweet tea

## 2017-05-11 NOTE — Progress Notes (Signed)
Subjective:   HPI  Denise Schroeder is a 52 y.o. female who presents for a physical Chief Complaint  Patient presents with  . Annual Exam    physical, tenderness in breast    Medical care team includes: Tysinger, Camelia Eng, PA-C here for primary care Dentist Eye doctor Gynecology, Dr. Claudine Mouton mammograms at [facility], pap up to date.  No recent problems with asthma since fall 2018.   Uses albuterol prn.  Having some breast tenderness.  No mass or lumps.  LMP 01/2017 and they are less frequent in recent months.  No breast discharge.  Last mammogram 07/2016 normal.    Reviewed their medical, surgical, family, social, medication, and allergy history and updated chart as appropriate.  Past Medical History:  Diagnosis Date  . Allergy   . Anemia   . GERD (gastroesophageal reflux disease)   . Goiter   . Hashimoto thyroiditis   . Obesity   . Uterine fibroid     Past Surgical History:  Procedure Laterality Date  . CHOLECYSTECTOMY    . COLONOSCOPY  04/2014   normal, Dr. Erskine Emery  . IR GENERIC HISTORICAL  05/31/2014   IR RADIOLOGIST EVAL & MGMT 05/31/2014 [facility] INTERV RAD  . UTERINE FIBROID EMBOLIZATION  2016   Dr. Garwin Brothers    Social History   Socioeconomic History  . Marital status: Single    Spouse name: Not on file  . Number of children: 1  . Years of education: Not on file  . Highest education level: Not on file  Social Needs  . Financial resource strain: Not on file  . Food insecurity - worry: Not on file  . Food insecurity - inability: Not on file  . Transportation needs - medical: Not on file  . Transportation needs - non-medical: Not on file  Occupational History  . Occupation: Civil engineer, contracting  Tobacco Use  . Smoking status: Never Smoker  . Smokeless tobacco: Never Used  Substance and Sexual Activity  . Alcohol use: Yes    Alcohol/week: 0.6 oz    Types: 1 Glasses of wine per week    Comment: Occassionally  . Drug use: No  .  Sexual activity: No    Birth control/protection: None  Other Topics Concern  . Not on file  Social History Narrative   Owns daycare.   Exercise is intermittent.  Off and on relationship.   04/2017.     Family History  Problem Relation Age of Onset  . Diabetes Mother   . Hypertension Mother   . Other Father        died in [place] when Burgandy was 52yo  . Hypertension Sister   . Other Brother        died in [place]  . Asthma Son   . Diabetes Maternal Aunt   . Diabetes Maternal Uncle   . Hypertension Sister   . Thyroid disease Sister   . Thyroid disease Sister   . Thyroid disease Sister   . Stroke Neg Hx   . Heart disease Neg Hx   . Hyperlipidemia Neg Hx   . Colon cancer Neg Hx   . Colon polyps Neg Hx   . Kidney disease Neg Hx   . Esophageal cancer Neg Hx   . Gallbladder disease Neg Hx   . Cancer Neg Hx      Current Outpatient Medications:  .  Albuterol Sulfate (PROAIR RESPICLICK) 366 (90 Base) MCG/ACT AEPB, Inhale 2 puffs into the  lungs every 4 (four) hours as needed., Disp: 1 each, Rfl: 0 .  CALCIUM PO, Take 2 tablets by mouth at bedtime. , Disp: , Rfl:  .  ibuprofen (ADVIL,MOTRIN) 800 MG tablet, Take 1 tablet (800 mg total) by mouth 3 (three) times daily. (Patient taking differently: Take 800 mg by mouth 2 (two) times daily as needed for moderate pain. ), Disp: 30 tablet, Rfl: 0 .  IRON PO, Take 1 tablet by mouth at bedtime as needed (for low iron leves). , Disp: , Rfl:  .  levothyroxine (SYNTHROID, LEVOTHROID) 100 MCG tablet, take 1 tablet by mouth once daily before BREAKFAST, Disp: 90 tablet, Rfl: 3 .  Multiple Vitamin (MULTIVITAMIN WITH MINERALS) TABS, Take 1 tablet by mouth every morning. , Disp: , Rfl:  .  mometasone-formoterol (DULERA) 100-5 MCG/ACT AERO, Inhale 2 puffs into the lungs 2 (two) times daily. (Patient not taking: Reported on 05/11/2017), Disp: 8.8 g, Rfl: 0  Allergies  Allergen Reactions  . Citrus Itching, Swelling and Cough    All fruits cause this reaction  .  Latex Hives    Review of Systems Constitutional: -fever, -chills, -sweats, -unexpected weight change, -decreased appetite, -fatigue Allergy: -sneezing, -itching, -congestion Dermatology: -changing moles, --rash, -lumps ENT: -runny nose, -ear pain, -sore throat, -hoarseness, -sinus pain, -teeth pain, - ringing in ears, -hearing loss, -nosebleeds Cardiology: -chest pain, -palpitations, -swelling, -difficulty breathing when lying flat, -waking up short of breath Respiratory: -cough, -shortness of breath, -difficulty breathing with exercise or exertion, -wheezing, -coughing up blood Gastroenterology: -abdominal pain, -nausea, -vomiting, -diarrhea, -constipation, -blood in stool, -changes in bowel movement, -difficulty swallowing or eating Hematology: -bleeding, -bruising  Musculoskeletal: -joint aches, -muscle aches, -joint swelling, -back pain, -neck pain, -cramping, -changes in gait Ophthalmology: denies vision changes, eye redness, itching, discharge Urology: -burning with urination, -difficulty urinating, -blood in urine, -urinary frequency, -urgency, -incontinence Neurology: -headache, -weakness, -tingling, -numbness, -memory loss, -falls, -dizziness Psychology: -depressed mood, -agitation, -sleep problems Breast/gyn: +breast tendnerss, -discharge, -lumps, -vaginal discharge,- irregular periods, -heavy periods    Objective:   BP 110/78   Pulse 71   Ht 5\' 8"  (1.727 m)   Wt 288 lb 12.8 oz (131 kg)   SpO2 97%   BMI 43.91 kg/m   General appearance: alert, no distress, WD/WN, African American female Skin: no worrisome lesions HEENT: normocephalic, conjunctiva/corneas normal, sclerae anicteric, PERRLA, EOMi, nares patent, no discharge or erythema, pharynx normal Oral cavity: MMM, tongue normal, teeth normal Neck: supple, no lymphadenopathy, moderate goiter, unchanged, no masses, normal ROM, no bruits Chest: non tender, normal shape and expansion Heart: RRR, normal S1, S2, no  murmurs Lungs: CTA bilaterally, no wheezes, rhonchi, or rales Abdomen: +bs, soft, non tender, non distended, no masses, no hepatomegaly, no splenomegaly, no bruits Back: non tender, normal ROM, no scoliosis Musculoskeletal: upper extremities non tender, no obvious deformity, normal ROM throughout, lower extremities non tender, no obvious deformity, normal ROM throughout Extremities: no edema, no cyanosis, no clubbing Pulses: 2+ symmetric, upper and lower extremities, normal cap refill Neurological: alert, oriented x 3, CN2-12 intact, strength normal upper extremities and lower extremities, sensation normal throughout, DTRs 2+ throughout, no cerebellar signs, gait normal Psychiatric: normal affect, behavior normal, pleasant  Breast: nontender, no masses or lumps, no skin changes, no nipple discharge or inversion, no axillary lymphadenopathy, exam chaperoned by nurse Gyn/rectal - deferred to gynecology    Assessment and Plan :    Encounter Diagnoses  Name Primary?  . Routine general medical examination at a health care facility  Yes  . Vaccine counseling   . Hyperlipidemia, unspecified hyperlipidemia type   . Hypothyroidism, unspecified type   . Goiter   . Uterine leiomyoma, unspecified location   . Iron deficiency anemia, unspecified iron deficiency anemia type   . Morbid obesity ([place])     Physical exam - discussed and counseled on healthy lifestyle, diet, exercise, preventative care, vaccinations, sick and well care, proper use of emergency dept and after hours care, and addressed their concerns.    Patient Instructions  Thanks for trusting [place] with your health care and for coming in for a physical today.  Below are some general recommendations I have for you:  Yearly screenings See your eye doctor yearly for routine vision care. See your dentist yearly for routine dental care including hygiene visits twice yearly. See me here yearly for a routine physical and preventative care  visit  Immunizations "vaccines"  I recommend you have a yearly influenza vaccine typically in the fall such as September yearly to prevent the flu  I recommend you have a shingles vaccine to help prevent shingles or herpes zoster outbreak.   Please call your insurer to inquire about coverage for the Shingrix vaccine given in 2 doses.   Some insurers cover this vaccine after age 30, some cover this after age 43.  If your insurer covers this, then call to schedule appointment to have this vaccine here.   Cancer screening  Colon cancer screening - you are not due for colonoscopy at this time but you are due for stool cards.  Please use the 3 cards given today to smear some poop/fecal matter on each card separately from 3 separate days.  Please mail them back so we can check them for blood  Breast cancer screening - schedule your mammogram yearly  Cervical cancer screening - you should have a pap smear every 3 years unless your gynecologist instructs you otherwise  Call insurance about doing a thyroid ultrasound given the goiter, which feels stable to me   Specific Concerns today: I recommend setting some fitness or weight loss goals.  Breast tenderness - no obvious lump or mass.  Advised she follow up with gynecologist  Consider weight watchers program, [employer], or if interested I can refer you to a weight loss study that is available currently  I recommend exercising most days of the week using a type of exercise that they would enjoy and stick to such as walking, running, swimming, hiking, biking, aerobics, etc.  I recommend a healthy diet.    Do's:   whole grains such as whole grain pasta, rice, whole grains breads and whole grain cereals.  Use small quantities such as 1/2 cup per serving or 2 slices of bread per serving.    Eat 3-5 fruits daily  Eat beans at least once daily  Eat almonds in small quantities at least 3 days per week    If they eat meat, I recommend  small portions of lean meats such as chicken, fish, and [place].  Eat as much NON corn and NON potato vegetables as they like, particularly raw or steamed  Drink several large glasses of water daily  Cautions:  Limit red meat  Limit corn and potatoes  Limit sweets, cake, pie, candy  Limit beer and alcohol  Avoid fried food, fast food, large portions  Avoid sugary drinks such as regular soda and sweet tea    Follow-up pending labs, yearly for physical

## 2017-05-12 LAB — LIPID PANEL
CHOL/HDL RATIO: 3.3 ratio (ref 0.0–4.4)
Cholesterol, Total: 221 mg/dL — ABNORMAL HIGH (ref 100–199)
HDL: 66 mg/dL (ref >39)
LDL CALC: 140 mg/dL — AB (ref 0–99)
TRIGLYCERIDES: 77 mg/dL (ref 0–149)
VLDL CHOLESTEROL CAL: 15 mg/dL (ref 5–40)

## 2017-05-12 LAB — COMPREHENSIVE METABOLIC PANEL
ALT: 15 IU/L (ref 0–32)
AST: 18 IU/L (ref 0–40)
Albumin/Globulin Ratio: 1.5 (ref 1.2–2.2)
Albumin: 4.3 g/dL (ref 3.5–5.5)
Alkaline Phosphatase: 86 IU/L (ref 39–117)
BUN/Creatinine Ratio: 16 (ref 9–23)
BUN: 14 mg/dL (ref 6–24)
Bilirubin Total: 0.3 mg/dL (ref 0.0–1.2)
CO2: 20 mmol/L (ref 20–29)
CREATININE: 0.88 mg/dL (ref 0.57–1.00)
Calcium: 9.5 mg/dL (ref 8.7–10.2)
Chloride: 104 mmol/L (ref 96–106)
GFR calc Af Amer: 87 mL/min/{1.73_m2} (ref >59)
GFR calc non Af Amer: 76 mL/min/{1.73_m2} (ref >59)
Globulin, Total: 2.9 g/dL (ref 1.5–4.5)
Glucose: 79 mg/dL (ref 65–99)
Potassium: 4.4 mmol/L (ref 3.5–5.2)
Sodium: 139 mmol/L (ref 134–144)
Total Protein: 7.2 g/dL (ref 6.0–8.5)

## 2017-05-12 LAB — IRON: Iron: 78 ug/dL (ref 27–159)

## 2017-06-02 ENCOUNTER — Ambulatory Visit
Admission: RE | Admit: 2017-06-02 | Discharge: 2017-06-02 | Disposition: A | Discharge status: Home / Self Care | Payer: BLUE CROSS/BLUE SHIELD | Source: Ambulatory Visit | Attending: Medical | Admitting: Medical

## 2017-06-02 ENCOUNTER — Ambulatory Visit: Payer: BLUE CROSS/BLUE SHIELD | Admitting: Medical

## 2017-06-02 ENCOUNTER — Telehealth: Payer: Self-pay | Admitting: Medical

## 2017-06-02 ENCOUNTER — Encounter: Payer: Self-pay | Admitting: Medical

## 2017-06-02 ENCOUNTER — Telehealth: Payer: Self-pay

## 2017-06-02 ENCOUNTER — Other Ambulatory Visit: Payer: Self-pay | Admitting: Medical

## 2017-06-02 VITALS — BP 118/78 | HR 74 | Temp 98.2°F | Ht 68.0 in | Wt 287.6 lb

## 2017-06-02 DIAGNOSIS — R5383 Other fatigue: Secondary | ICD-10-CM

## 2017-06-02 DIAGNOSIS — R42 Dizziness and giddiness: Secondary | ICD-10-CM

## 2017-06-02 DIAGNOSIS — R51 Headache: Secondary | ICD-10-CM | POA: Diagnosis not present

## 2017-06-02 DIAGNOSIS — R519 Headache, unspecified: Secondary | ICD-10-CM

## 2017-06-02 MED ORDER — MECLIZINE HCL 25 MG PO TABS: 25.0000 mg | ORAL_TABLET | Freq: Two times a day (BID) | ORAL | 0 refills | Status: DC

## 2017-06-02 MED ORDER — AMOXICILLIN 500 MG PO TABS: ORAL_TABLET | ORAL | 0 refills | Status: DC

## 2017-06-02 NOTE — Telephone Encounter (Signed)
Called patient and advised of lab work and RX sent in. No questions.

## 2017-06-02 NOTE — Progress Notes (Signed)
Subjective: Chief Complaint  Patient presents with  . Acute Visit    headache, off balance, started Sunday   Sunday 4 days ago was feeling tired, drained, felt off.  Started 4 days ago with balance feeling off.  Feels like body is off, something not right.  Has had symptom the last few days similarly.  Worse last night.  Started drinking a lot of water yesterday.  Felt light headed any time she went to pick something off the shelf.  No numbness, no tingling, no obvious weakness, no slurred speech, no specific confusion, but felt a little confused Monday 3 days ago.  Getting sleepy mid day.  Started getting headache this morning, 10/10 this morning but ibuprofen helped.  Feels sluggish today.  No chest pain, no dyspnea, no swelling.  No incontinence.  No vision or hearing changes.  Compliant with medication.   No known snoring, no witnessed apnea, no daytime somnolence other than last 3-4 days.  Usually awakes rested.   No ringing in the ear.  She notes no sinus problems other than some right ear discomfort.  Has had some runny nose, head pressure.  No other aggravating or relieving factors. No other complaint.   Past Medical History:  Diagnosis Date  . Allergy   . Anemia   . GERD (gastroesophageal reflux disease)   . Goiter   . Hashimoto thyroiditis   . Obesity   . Uterine fibroid    Current Outpatient Medications on File Prior to Visit  Medication Sig Dispense Refill  . Albuterol Sulfate (PROAIR RESPICLICK) 580 (90 Base) MCG/ACT AEPB Inhale 2 puffs into the lungs every 4 (four) hours as needed. 1 each 0  . CALCIUM PO Take 2 tablets by mouth at bedtime.     Marland Kitchen ibuprofen (ADVIL,MOTRIN) 800 MG tablet Take 1 tablet (800 mg total) by mouth 3 (three) times daily. (Patient taking differently: Take 800 mg by mouth 2 (two) times daily as needed for moderate pain. ) 30 tablet 0  . IRON PO Take 1 tablet by mouth at bedtime as needed (for low iron leves).     Marland Kitchen levothyroxine (SYNTHROID, LEVOTHROID) 100  MCG tablet take 1 tablet by mouth once daily before BREAKFAST 90 tablet 3  . mometasone-formoterol (DULERA) 100-5 MCG/ACT AERO Inhale 2 puffs into the lungs 2 (two) times daily. 8.8 g 0  . Multiple Vitamin (MULTIVITAMIN WITH MINERALS) TABS Take 1 tablet by mouth every morning.      No current facility-administered medications on file prior to visit.    Family History  Problem Relation Age of Onset  . Diabetes Mother   . Hypertension Mother   . Other Father        died in [place] when Jacqualin was 52yo  . Hypertension Sister   . Other Brother        died in [place]  . Asthma Son   . Diabetes Maternal Aunt   . Diabetes Maternal Uncle   . Hypertension Sister   . Thyroid disease Sister   . Thyroid disease Sister   . Thyroid disease Sister   . Stroke Neg Hx   . Heart disease Neg Hx   . Hyperlipidemia Neg Hx   . Colon cancer Neg Hx   . Colon polyps Neg Hx   . Kidney disease Neg Hx   . Esophageal cancer Neg Hx   . Gallbladder disease Neg Hx   . Cancer Neg Hx    ROS as in subjective    Objective:  BP 124/82 (BP Location: Right Arm, Patient Position: Sitting, Cuff Size: Normal)   Pulse 96   Temp 98.2 F (36.8 C) (Oral)   Ht 5\' 8"  (1.727 m)   Wt 287 lb 9.6 oz (130.5 kg)   SpO2 99%   BMI 43.73 kg/m   BP Readings from Last 3 Encounters:  06/02/17 124/82  05/11/17 110/78  02/11/17 110/78   General appearance: alert, no distress, WD/WN, AA female HEENT: normocephalic, sclerae anicteric, PERRLA, EOMi, nares patent, no discharge or erythema, pharynx normal Oral cavity: MMM, no lesions Neck: supple, no lymphadenopathy, no thyromegaly, no masses, no bruits Heart: RRR, normal S1, S2, no murmurs Lungs: CTA bilaterally, no wheezes, rhonchi, or rales Extremities: no edema, no cyanosis, no clubbing Pulses: 2+ symmetric, upper and lower extremities, normal cap refill Neurological: somewhat fatigued appearing, alert, oriented x 3, +ataxia noted with Romberg and heel to toe, but no pronator  drift, difficulty, had some initial trouble counting backwards from 100.  otherwise CN2-12 intact, strength normal upper extremities and lower extremities, sensation normal throughout, DTRs 2+ throughout, gait normal Psychiatric: normal affect, behavior normal, pleasant     Assessment: Encounter Diagnoses  Name Primary?  Marland Kitchen Nonintractable headache, unspecified chronicity pattern, unspecified headache type Yes  . Dizziness   . Fatigue, unspecified type      Plan: We discussed symptoms and limited exam abnormalities.  Cant' completely rule out TIA/CVA but possible sinusitis and vertigo.  Advised rest, hydration, and we will send for STAT head CT now.  If any worsening neuro symptoms as discussed, then call 911. Of note was hospitalized 07/2015 for dizziness and vision loss, somewhat similar to today's issues, but she notes the fatigue and headache is unique to this episode, doesn't feel right.    Korryn was seen today for acute visit.  Diagnoses and all orders for this visit:  Nonintractable headache, unspecified chronicity pattern, unspecified headache type -     CT Head Wo Contrast; Future  Dizziness  Fatigue, unspecified type

## 2017-06-02 NOTE — Telephone Encounter (Signed)
Called pt and left message to call back to make an appt per [facility] instructions

## 2017-06-02 NOTE — Telephone Encounter (Signed)
-----   Message from Carlena Hurl, PA-C sent at 06/02/2017  4:51 PM EDT ----- Please call her about the head CT.  Fortunately it was normal.  No signs of TIA or stroke.  Let us have her begin a round of amoxicillin for sinus infection, and begin meclizine twice a day for vertigo symptoms.  Rest, hydrate well, do not drive if too dizzy.    Have her call back by Friday to let us know if she is improving

## 2017-06-02 NOTE — Telephone Encounter (Signed)
Pt's balance has been off since Sunday, last night she noticed dizziness when she would look down then back up, now has a headache. She has a history of vertigo but not sure if that's what she has. Does she need to be seen or what does she need to do?

## 2017-06-07 ENCOUNTER — Other Ambulatory Visit (INDEPENDENT_AMBULATORY_CARE_PROVIDER_SITE_OTHER): Payer: BLUE CROSS/BLUE SHIELD

## 2017-06-07 ENCOUNTER — Other Ambulatory Visit: Payer: Self-pay | Admitting: Medical

## 2017-06-07 ENCOUNTER — Telehealth: Payer: Self-pay | Admitting: Medical

## 2017-06-07 DIAGNOSIS — Z1211 Encounter for screening for malignant neoplasm of colon: Secondary | ICD-10-CM

## 2017-06-07 LAB — HEMOCCULT GUIAC POC 1CARD (OFFICE)
Card #1 Date: 30519
Card #2 Fecal Occult Blod, POC: NEGATIVE
Card #3 Fecal Occult Blood, POC: NEGATIVE
FECAL OCCULT BLD: NEGATIVE

## 2017-06-07 MED ORDER — FLUCONAZOLE 100 MG PO TABS: ORAL_TABLET | ORAL | 0 refills | Status: DC

## 2017-06-07 NOTE — Telephone Encounter (Signed)
I sent diflucan for yeast infection, 1 tablet every other day for 3 doses.   I would recommend she hydrate well with water and use Tylenol for headache if needed at this point, but hold off on the amoxacillin or medication for dizziness if doing better.

## 2017-06-07 NOTE — Telephone Encounter (Signed)
Pt called and stated that she is doing better. She states she had to stop the medication she was given for dizziness. She states it gave her body aches. She states that yesterday she wasn't dizzy. She also states she had to quit the amoxicillin due to the fact that she got a yeast infection. She states that her sinus infection is better but still having mild headaches. She states if you want to continue on amoxicillin she will need something for the yeast infection. Pt uses [facility] on [address] and can be reached at [phone].

## 2017-06-08 ENCOUNTER — Telehealth: Payer: Self-pay

## 2017-06-08 NOTE — Telephone Encounter (Signed)
Called patient and gave lab results. Patient had no questions or concerns.  

## 2017-06-08 NOTE — Telephone Encounter (Signed)
-----   Message from Carlena Hurl, PA-C sent at 06/07/2017  8:24 PM EDT ----- Stool cards normal or negative for blood

## 2017-06-08 NOTE — Telephone Encounter (Signed)
Called patient and advised of note. Patient understood and had no questions or concerns.

## 2017-07-26 ENCOUNTER — Other Ambulatory Visit: Payer: BLUE CROSS/BLUE SHIELD

## 2017-07-26 DIAGNOSIS — Z111 Encounter for screening for respiratory tuberculosis: Secondary | ICD-10-CM

## 2017-07-28 DIAGNOSIS — Z111 Encounter for screening for respiratory tuberculosis: Secondary | ICD-10-CM | POA: Diagnosis not present

## 2017-07-28 LAB — TB SKIN TEST: TB SKIN TEST: NEGATIVE

## 2017-08-10 LAB — HM MAMMOGRAPHY

## 2017-08-10 LAB — HM PAP SMEAR: HM Pap smear: NEGATIVE

## 2017-11-10 ENCOUNTER — Ambulatory Visit: Payer: BLUE CROSS/BLUE SHIELD | Admitting: Medical

## 2017-11-10 VITALS — BP 110/76 | HR 58 | Temp 97.9°F | Resp 16 | Ht 68.0 in | Wt 289.0 lb

## 2017-11-10 DIAGNOSIS — E7800 Pure hypercholesterolemia, unspecified: Secondary | ICD-10-CM | POA: Diagnosis not present

## 2017-11-10 DIAGNOSIS — E039 Hypothyroidism, unspecified: Secondary | ICD-10-CM

## 2017-11-10 DIAGNOSIS — D509 Iron deficiency anemia, unspecified: Secondary | ICD-10-CM

## 2017-11-10 DIAGNOSIS — E049 Nontoxic goiter, unspecified: Secondary | ICD-10-CM

## 2017-11-10 DIAGNOSIS — Z7189 Other specified counseling: Secondary | ICD-10-CM

## 2017-11-10 DIAGNOSIS — L920 Granuloma annulare: Secondary | ICD-10-CM

## 2017-11-10 DIAGNOSIS — Z23 Encounter for immunization: Secondary | ICD-10-CM

## 2017-11-10 DIAGNOSIS — Z7185 Encounter for immunization safety counseling: Secondary | ICD-10-CM

## 2017-11-10 MED ORDER — TRIAMCINOLONE ACETONIDE 0.1 % EX CREA: 1.0000 "application " | TOPICAL_CREAM | Freq: Two times a day (BID) | CUTANEOUS | 0 refills | Status: DC

## 2017-11-10 NOTE — Patient Instructions (Addendum)
Glad to hear you are staying active  Recommendations  Lets plan to check thyroid and carotid ultrasounds next year 2020 for screenings  Shingles vaccine:  I recommend you have a shingles vaccine to help prevent shingles or herpes zoster outbreak.   Please call your insurer to inquire about coverage for the Shingrix vaccine given in 2 doses.   Some insurers cover this vaccine after age 52, some cover this after age 60.  If your insurer covers this, then call to schedule appointment to have this vaccine here.  We updated your flu shot today   I suspect your rash is called Granuloma Annulare  Begin Triamcinolone cream topically daily for a week to 10 days to see if this resolves the rash.   The rash can come back, particular if friction or irration of the skin   Follow up for physical 04/2018

## 2017-11-10 NOTE — Progress Notes (Signed)
Subjective: Chief Complaint  Patient presents with  . med check    med check  rash on elbow   Here for med check and rash.  Exercise 3 times per week.  Doing some resistance training.   Drinking a gallon of water daily.   Trying to eat healthy choices.   Trying to be healthy.  No soda.  Working with a Clinical research associate, working on [employer], agility.  Doing some intermittent fasting.    Has seen a drop in pant size.  Back when dizzy few weeks ago, had fallen landed on left shoulder.  Saw chiropractor for a time to do some therapies.    Compliant with thyroid medication.  Rash - has reoccurring bumps/rash on left elbow. Gets this in summer months.  It comes and goes.  Saw dermatology in past years for this, but she feels they didn't give advice on what to do about this.  She notes taking some CBD capsule from [employer] recently, felt high.  Hasn't taken any more.  Past Medical History:  Diagnosis Date  . Allergy   . Anemia   . GERD (gastroesophageal reflux disease)   . Goiter   . Hashimoto thyroiditis   . Obesity   . Uterine fibroid    Current Outpatient Medications on File Prior to Visit  Medication Sig Dispense Refill  . Albuterol Sulfate (PROAIR RESPICLICK) 782 (90 Base) MCG/ACT AEPB Inhale 2 puffs into the lungs every 4 (four) hours as needed. 1 each 0  . CALCIUM PO Take 2 tablets by mouth at bedtime.     Marland Kitchen ibuprofen (ADVIL,MOTRIN) 800 MG tablet Take 1 tablet (800 mg total) by mouth 3 (three) times daily. (Patient taking differently: Take 800 mg by mouth 2 (two) times daily as needed for moderate pain. ) 30 tablet 0  . IRON PO Take 1 tablet by mouth at bedtime as needed (for low iron leves).     Marland Kitchen levothyroxine (SYNTHROID, LEVOTHROID) 100 MCG tablet take 1 tablet by mouth once daily before BREAKFAST 90 tablet 3  . meclizine (ANTIVERT) 25 MG tablet Take 1 tablet (25 mg total) by mouth 2 (two) times daily. 30 tablet 0  . mometasone-formoterol (DULERA) 100-5 MCG/ACT AERO Inhale 2  puffs into the lungs 2 (two) times daily. 8.8 g 0  . Multiple Vitamin (MULTIVITAMIN WITH MINERALS) TABS Take 1 tablet by mouth every morning.      No current facility-administered medications on file prior to visit.    ROS as in subjective     Objective: BP 110/76   Pulse (!) 58   Temp 97.9 F (36.6 C) (Oral)   Resp 16   Ht 5\' 8"  (1.727 m)   Wt 289 lb (131.1 kg)   SpO2 97%   BMI 43.94 kg/m   Wt Readings from Last 3 Encounters:  11/10/17 289 lb (131.1 kg)  06/02/17 287 lb 9.6 oz (130.5 kg)  05/11/17 288 lb 12.8 oz (131 kg)   General appearance: alert, no distress, WD/WN,  Neck: supple, no lymphadenopathy, no thyromegaly, no masses Heart: RRR, normal S1, S2, no murmurs Lungs: CTA bilaterally, no wheezes, rhonchi, or rales Pulses: 2+ symmetric, upper and lower extremities, normal cap refill Skin: left posterior elbow with scattered patches of raised amorphous thickened rash, no erythema, no fluctuance, no induration, no rough skin    Assessment: Encounter Diagnoses  Name Primary?  . Hypothyroidism, unspecified type Yes  . Goiter   . Iron deficiency anemia, unspecified iron deficiency anemia type   .  Elevated cholesterol   . Need for influenza vaccination   . Vaccine counseling   . Granuloma annulare      Plan: Hypothyroidism - c/t current medications, labs today  Goiter - plan [place] for 2020  Anemia, hx/o - lab today, she is taking iron once daily  Discussed prior elevated lipids, c/t efforts with healthy diet, exercise  Discussed skin finding, most likely granuloma annulare, trial of short term TAC cream.  Call report in a few weeks  Counseled on the influenza virus vaccine.  Vaccine information sheet given.  Influenza vaccine given after consent obtained.  Denise Schroeder was seen today for med check.  Diagnoses and all orders for this visit:  Hypothyroidism, unspecified type -     TSH -     T4, free  Goiter -     TSH -     T4, free  Iron deficiency  anemia, unspecified iron deficiency anemia type -     CBC with Differential/Platelet  Elevated cholesterol  Need for influenza vaccination  Vaccine counseling  Granuloma annulare  Other orders -     triamcinolone cream (KENALOG) 0.1 %; Apply 1 application topically 2 (two) times daily.

## 2017-11-11 ENCOUNTER — Telehealth: Payer: Self-pay | Admitting: Medical

## 2017-11-11 ENCOUNTER — Other Ambulatory Visit: Payer: Self-pay | Admitting: Medical

## 2017-11-11 LAB — CBC WITH DIFFERENTIAL/PLATELET
BASOS ABS: 0 10*3/uL (ref 0.0–0.2)
Basos: 1 %
EOS (ABSOLUTE): 0.2 10*3/uL (ref 0.0–0.4)
Eos: 5 %
HEMOGLOBIN: 12.1 g/dL (ref 11.1–15.9)
Hematocrit: 36.5 % (ref 34.0–46.6)
Immature Grans (Abs): 0 10*3/uL (ref 0.0–0.1)
Immature Granulocytes: 0 %
LYMPHS ABS: 1.6 10*3/uL (ref 0.7–3.1)
Lymphs: 36 %
MCH: 28.5 pg (ref 26.6–33.0)
MCHC: 33.2 g/dL (ref 31.5–35.7)
MCV: 86 fL (ref 79–97)
MONOCYTES: 13 %
MONOS ABS: 0.6 10*3/uL (ref 0.1–0.9)
Neutrophils Absolute: 2 10*3/uL (ref 1.4–7.0)
Neutrophils: 45 %
Platelets: 256 10*3/uL (ref 150–450)
RBC: 4.25 x10E6/uL (ref 3.77–5.28)
RDW: 13.2 % (ref 12.3–15.4)
WBC: 4.4 10*3/uL (ref 3.4–10.8)

## 2017-11-11 LAB — TSH: TSH: 1.48 u[IU]/mL (ref 0.450–4.500)

## 2017-11-11 LAB — T4, FREE: Free T4: 1.49 ng/dL (ref 0.82–1.77)

## 2017-11-11 MED ORDER — LEVOTHYROXINE SODIUM 100 MCG PO TABS: ORAL_TABLET | ORAL | 3 refills | Status: DC

## 2017-11-11 NOTE — Telephone Encounter (Signed)
Received requested records from [facility]. Included was pap and mammo. Sending back for review.

## 2017-12-15 ENCOUNTER — Other Ambulatory Visit: Payer: Self-pay

## 2017-12-15 ENCOUNTER — Telehealth: Payer: Self-pay | Admitting: Medical

## 2017-12-15 DIAGNOSIS — Z87892 Personal history of anaphylaxis: Secondary | ICD-10-CM

## 2017-12-15 MED ORDER — EPINEPHRINE 0.3 MG/0.3ML IJ SOAJ: 0.3000 mg | Freq: Once | INTRAMUSCULAR | Status: DC

## 2017-12-15 NOTE — Telephone Encounter (Signed)
pls refill 

## 2017-12-15 NOTE — Telephone Encounter (Signed)
done

## 2017-12-15 NOTE — Telephone Encounter (Signed)
Pt called and is requesting a refill on her Epipen pt would like it sent to the [id number] - [place], [address] AT [address] and pt can be reached at [phone]

## 2017-12-20 ENCOUNTER — Telehealth: Payer: Self-pay

## 2017-12-20 ENCOUNTER — Ambulatory Visit (INDEPENDENT_AMBULATORY_CARE_PROVIDER_SITE_OTHER): Payer: BLUE CROSS/BLUE SHIELD | Admitting: Family Medicine

## 2017-12-20 ENCOUNTER — Encounter (INDEPENDENT_AMBULATORY_CARE_PROVIDER_SITE_OTHER): Payer: Self-pay | Admitting: Family Medicine

## 2017-12-20 ENCOUNTER — Other Ambulatory Visit: Payer: Self-pay

## 2017-12-20 ENCOUNTER — Ambulatory Visit (INDEPENDENT_AMBULATORY_CARE_PROVIDER_SITE_OTHER): Payer: Self-pay

## 2017-12-20 VITALS — Ht 68.0 in | Wt 287.0 lb

## 2017-12-20 DIAGNOSIS — M79671 Pain in right foot: Secondary | ICD-10-CM

## 2017-12-20 MED ORDER — DICLOFENAC SODIUM 1 % TD GEL: 4.0000 g | Freq: Four times a day (QID) | TRANSDERMAL | 6 refills | Status: DC | PRN

## 2017-12-20 MED ORDER — EPINEPHRINE 0.3 MG/0.3ML IJ SOAJ: INTRAMUSCULAR | 0 refills | Status: DC

## 2017-12-20 NOTE — Patient Instructions (Signed)
   1.  Arch supports  2.  Glucosamine Sulfate 1000 mg twice daily  3.  Turmeric 500 mg twice daily  Monitor diet for any possible problem foods.  Minimize sugar.

## 2017-12-20 NOTE — Progress Notes (Signed)
Office Visit Note   Patient: Denise Schroeder           Date of Birth: 08-12-1965           MRN: [id number] Visit Date: 12/20/2017 Requested by: Carlena Hurl, PA-C [address] [place], [place] [address] PCP: Carlena Hurl, PA-C  Subjective: Chief Complaint  Patient presents with  . Right Foot - Pain    Pain on top of right foot, hx of injury playing tennis years ago, feels like it needs to pop and it would feel better.    HPI: She is a 52 year old with right foot pain.  Off-and-on pain for many years.  Seems to be getting worse.  She feels like it needs to pop but it never will.  Ibuprofen helps.  The pain is worse at the end of the day.  Pain is on the lateral aspect of the midfoot.  She has not had any swelling or bruising.  She recalls a twisting injury many years ago playing tennis and she wonders whether that might have started it.  In addition to ibuprofen, she has tried CBD oil with some improvement.  Denies any history of vitamin D deficiency, she is not on any new medications.  No family history of rheumatology disorders.              ROS: Otherwise noncontributory  Objective: Vital Signs: Ht 5\' 8"  (1.727 m)   Wt 287 lb (130.2 kg)   BMI 43.64 kg/m   Physical Exam:  Right foot: No swelling or bruising.  No pain with dorsiflexion, eversion, plantarflexion against resistance.  No pain with extension of the toes against resistance.  She is primarily tender at the base of the fourth and fifth metatarsals near the TMT joint.  Imaging: 3 view right foot x-rays: She has early degenerative change at the fourth and fifth TMT joint and also at the talonavicular joint on the lateral view.  She has a traction spur at the plantar fascia origin on the calcaneus.  No sign of stress fracture.  Assessment & Plan: 1.  Chronic right foot pain, suspect due to fourth and fifth TMT DJD -Arch supports in her shoes.  Glucosamine and turmeric.  Voltaren gel topically.  Watch for any  food triggers; minimize sugar intake. -Consider MRI scan if symptoms worsen or possibly injecting the joint with ultrasound.   Follow-Up Instructions: No follow-ups on file.       Procedures: None.   PMFS History: Patient Active Problem List   Diagnosis Date Noted  . Granuloma annulare 11/10/2017  . Routine general medical examination at a health care facility 05/01/2016  . Vaccine counseling 05/01/2016  . Relationship problems 05/01/2016  . Morbid obesity ([place]) 11/28/2015  . Hypothyroidism 11/28/2015  . History of anaphylaxis 11/28/2015  . Goiter 11/28/2015  . Need for influenza vaccination 11/28/2015  . Elevated cholesterol   . Paresthesia 08/09/2015  . Uterine fibroid 06/12/2014  . Anemia 04/27/2014   Past Medical History:  Diagnosis Date  . Allergy   . Anemia   . GERD (gastroesophageal reflux disease)   . Goiter   . Hashimoto thyroiditis   . Obesity   . Uterine fibroid     Family History  Problem Relation Age of Onset  . Diabetes Mother   . Hypertension Mother   . Other Father        died in [place] when [place]ah was 52yo  . Hypertension Sister   . Other Brother  died in [place]  . Asthma Son   . Diabetes Maternal Aunt   . Diabetes Maternal Uncle   . Hypertension Sister   . Thyroid disease Sister   . Thyroid disease Sister   . Thyroid disease Sister   . Stroke Neg Hx   . Heart disease Neg Hx   . Hyperlipidemia Neg Hx   . Colon cancer Neg Hx   . Colon polyps Neg Hx   . Kidney disease Neg Hx   . Esophageal cancer Neg Hx   . Gallbladder disease Neg Hx   . Cancer Neg Hx     Past Surgical History:  Procedure Laterality Date  . CHOLECYSTECTOMY    . COLONOSCOPY  04/2014   normal, Dr. Erskine Emery  . IR GENERIC HISTORICAL  05/31/2014   IR RADIOLOGIST EVAL & MGMT 05/31/2014 [facility] INTERV RAD  . UTERINE FIBROID EMBOLIZATION  2016   Dr. Garwin Brothers   Social History   Occupational History  . Occupation: Civil engineer, contracting  Tobacco Use  . Smoking  status: Never Smoker  . Smokeless tobacco: Never Used  Substance and Sexual Activity  . Alcohol use: Yes    Alcohol/week: 1.0 standard drinks    Types: 1 Glasses of wine per week    Comment: Occassionally  . Drug use: No  . Sexual activity: Never    Birth control/protection: None

## 2017-12-20 NOTE — Telephone Encounter (Signed)
done

## 2018-01-14 ENCOUNTER — Emergency Department (HOSPITAL_COMMUNITY): Payer: BLUE CROSS/BLUE SHIELD

## 2018-01-14 ENCOUNTER — Encounter (HOSPITAL_COMMUNITY): Payer: Self-pay | Admitting: Emergency Medicine

## 2018-01-14 ENCOUNTER — Emergency Department (HOSPITAL_COMMUNITY)
Admission: EM | Admit: 2018-01-14 | Discharge: 2018-01-14 | Disposition: A | Discharge status: Home / Self Care | Payer: BLUE CROSS/BLUE SHIELD | Attending: Emergency Medicine | Admitting: Emergency Medicine

## 2018-01-14 DIAGNOSIS — M79631 Pain in right forearm: Secondary | ICD-10-CM

## 2018-01-14 DIAGNOSIS — M79642 Pain in left hand: Secondary | ICD-10-CM

## 2018-01-14 DIAGNOSIS — Z79899 Other long term (current) drug therapy: Secondary | ICD-10-CM | POA: Insufficient documentation

## 2018-01-14 DIAGNOSIS — M546 Pain in thoracic spine: Secondary | ICD-10-CM | POA: Diagnosis not present

## 2018-01-14 MED ORDER — METHOCARBAMOL 500 MG PO TABS: 500.0000 mg | ORAL_TABLET | Freq: Every evening | ORAL | 0 refills | Status: DC | PRN

## 2018-01-14 MED ORDER — IBUPROFEN 800 MG PO TABS: 800.0000 mg | ORAL_TABLET | Freq: Three times a day (TID) | ORAL | 0 refills | Status: DC

## 2018-01-14 NOTE — ED Notes (Signed)
Patient verbalizes understanding of discharge instructions. Opportunity for questioning and answers were provided. Armband removed by staff, pt discharged from ED home via POV.  
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
## 2018-01-14 NOTE — Discharge Instructions (Addendum)
Take ibuprofen 3 times a day with meals.  Do not take other anti-inflammatories at the same time (Advil, Motrin, naproxen, Aleve). You may supplement with Tylenol if you need further pain control. Use robaxin as needed for muscle stiffness or soreness.  Have caution, this may make you tired or groggy.  Do not drive or operate heavy machinery while taking this medicine. Use ice packs or heating pads if this helps control your pain. Use muscle creams (salonpas, icy hot, bengay) as needed for pain relief. You will likely have continued muscle stiffness and soreness over the next couple days.  Follow-up with primary care in 1 week if your symptoms are not improving. Return to the emergency room if you develop vision changes, vomiting, slurred speech, numbness, loss of bowel or bladder control, or any new or worsening symptoms.
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
## 2018-01-19 ENCOUNTER — Ambulatory Visit: Payer: BLUE CROSS/BLUE SHIELD | Admitting: Medical

## 2018-01-19 ENCOUNTER — Encounter: Payer: Self-pay | Admitting: Medical

## 2018-01-19 VITALS — BP 120/80 | HR 66 | Temp 98.1°F | Resp 16 | Ht 68.0 in | Wt 295.2 lb

## 2018-01-19 DIAGNOSIS — M62838 Other muscle spasm: Secondary | ICD-10-CM | POA: Diagnosis not present

## 2018-01-19 DIAGNOSIS — T148XXA Other injury of unspecified body region, initial encounter: Secondary | ICD-10-CM

## 2018-01-19 DIAGNOSIS — R51 Headache: Secondary | ICD-10-CM

## 2018-01-19 DIAGNOSIS — M542 Cervicalgia: Secondary | ICD-10-CM

## 2018-01-19 DIAGNOSIS — M549 Dorsalgia, unspecified: Secondary | ICD-10-CM

## 2018-01-19 DIAGNOSIS — S134XXA Sprain of ligaments of cervical spine, initial encounter: Secondary | ICD-10-CM

## 2018-01-19 DIAGNOSIS — R519 Headache, unspecified: Secondary | ICD-10-CM

## 2018-01-19 DIAGNOSIS — R42 Dizziness and giddiness: Secondary | ICD-10-CM
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
## 2018-01-19 NOTE — Patient Instructions (Signed)
Recommendations  REST!!!  Continue the ibuprofen and Robaxin muscle relaxer given by the emerged department.   Follow up with chiropractor   Use COMPLETE REST including mental rest, physical rest, emotional rest the next 3-4 days.  As your symptoms of headache, dizziness, and cloudy thought improved, then gradually add back some activities as tolerated.  If not improving within 2-3 days in terms of dizziness, headaches and thought process, then recheck  If worsening dizziness ,headaches, confusion, or if nausea and vomiting, blurred vision or not acting yourself, then call 911 or go to the emergency department.  I would go as far as to say you have had a concussion, but its possible your symptoms could represent a mild concussion in conjunction with whiplash injury.     Make sure a friend or family member is checking on you the next few days.   Concussion, Adult A concussion is a brain injury from a direct hit (blow) to the head or body. This injury causes the brain to shake quickly back and forth inside the skull. It is caused by:  A hit to the head.  A quick and sudden movement (jolt) of the head or neck.  How fast you will get better from a concussion depends on many things like how bad your concussion was, what part of your brain was hurt, how old you are, and how healthy you were before the concussion. Recovery can take time. It is important to wait to return to activity until a doctor says it is safe and your symptoms are all gone. Follow these instructions at home: Activity  Limit activities that need a lot of thought or concentration. These include: ? Homework or work for your job. ? Watching TV. ? Computer work. ? Playing memory games and puzzles.  Rest. Rest helps the brain to heal. Make sure you: ? Get plenty of sleep at night. Do not stay up late. ? Go to bed at the same time every day. ? Rest during the day. Take naps or rest breaks when you feel tired.  It can be  dangerous if you get another concussion before the first one has healed Do not do activities that could cause a second concussion, such as riding a bike or playing sports.  Ask your doctor when you can return to your normal activities, like driving, riding a bike, or using machinery. Your ability to react may be slower. Do not do these activities if you are dizzy. Your doctor will likely give you a plan for slowly going back to activities. General instructions  Take over-the-counter and prescription medicines only as told by your doctor.  Do not drink alcohol until your doctor says you can.  If it is harder than usual to remember things, write them down.  If you are easily distracted, try to do one thing at a time. For example, do not try to watch TV while making dinner.  Talk with family members or close friends when you need to make important decisions.  Watch your symptoms and tell other people to do the same. Other problems (complications) can happen after a concussion. Older adults with a brain injury may have a higher risk of serious problems, such as a blood clot in the brain.  Tell your teachers, school nurse, school counselor, coach, Product/process development scientist, or work Freight forwarder about your injury and symptoms. Tell them about what you can or cannot do. They should watch for: ? More problems with attention or concentration. ? More  trouble remembering or learning new information. ? More time needed to do tasks or assignments. ? Being more annoyed (irritable) or having a harder time dealing with stress. ? Any other symptoms that get worse.  Keep all follow-up visits as told by your health care provider. This is important. Prevention  It is very important that you don't get another brain injury, especially before you have healed. In rare cases, another injury can cause permanent brain damage, brain swelling, or death. You have the most risk if you get another head injury in the first 7-10 days  after you were hurt before. To avoid injuries: ? Wear a seat belt when you ride in a car. ? Do not drink too much alcohol. ? Avoid activities that could make you get a second concussion, like contact sports. ? Wear a helmet when you do activities like:  Biking.  Skiing.  Skateboarding.  Skating. ? Make your home safe by:  Removing things from the floor or stairs that could make you trip.  Using grab bars in bathrooms and handrails by stairs.  Placing non-slip mats on floors and in bathtubs.  Putting more light in dark areas. Contact a doctor if:  Your symptoms get worse.  You have new symptoms.  You keep having symptoms for more than 2 weeks. Get help right away if:  You have bad headaches, or your headaches get worse.  You have weakness in any part of your body.  You have loss of feeling (numbness).  You feel off balance.  You keep throwing up (vomiting).  You feel more sleepy.  The black center of one eye (pupil) is bigger than the other one.  You twitch or shake violently (convulse) or have a seizure.  Your speech is not clear (is slurred).  You feel more tired, more confused, or more annoyed.  You do not recognize people or places.  You have neck pain.  It is hard to wake you up.  You have strange behavior changes.  You pass out (lose consciousness). Summary  A concussion is a brain injury from a direct hit (blow) to the head or body.  This condition is treated with rest and careful watching of symptoms.  If you keep having symptoms for more than 2 weeks, call your doctor. This information is not intended to replace advice given to you by your health care provider. Make sure you discuss any questions you have with your health care provider. Document Released: 02/18/2009 Document Revised: 02/15/2016 Document Reviewed: 02/15/2016 Elsevier Interactive Patient Education  2017 [employer].

## 2018-02-04 ENCOUNTER — Ambulatory Visit: Payer: BLUE CROSS/BLUE SHIELD | Admitting: Medical

## 2018-02-04 ENCOUNTER — Encounter: Payer: Self-pay | Admitting: Medical

## 2018-02-04 VITALS — BP 128/76 | HR 71 | Temp 97.9°F | Wt 292.2 lb

## 2018-02-04 DIAGNOSIS — R42 Dizziness and giddiness: Secondary | ICD-10-CM

## 2018-02-04 DIAGNOSIS — M542 Cervicalgia: Secondary | ICD-10-CM | POA: Diagnosis not present

## 2018-02-04 DIAGNOSIS — R519 Headache, unspecified: Secondary | ICD-10-CM

## 2018-02-04 DIAGNOSIS — S134XXA Sprain of ligaments of cervical spine, initial encounter: Secondary | ICD-10-CM | POA: Diagnosis not present

## 2018-02-04 DIAGNOSIS — R51 Headache: Secondary | ICD-10-CM

## 2018-02-04 MED ORDER — TOPIRAMATE 25 MG PO TABS: 25.0000 mg | ORAL_TABLET | Freq: Every day | ORAL | 1 refills | Status: DC
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
## 2018-02-04 NOTE — Patient Instructions (Signed)
Recommendations   Continue with chiropractic therapy  Begin trial of medication Topamax/Topiramate once daily at bedtime to help with headaches and dizziness  Please call back in 1 week to let me know if you feel like this is improving your symptoms  If not improving within a week I may go ahead and bump up the dose a little  Do some stretching every day  Let us plan on seeing you back in the office in 2 to 3 weeks depending on how you are doing over the next week  You should not get pregnant on this medication. If you are sexually active and not on birth control, then let me know.     Topiramate tablets What is this medicine? TOPIRAMATE (toe PYRE a mate) is used to treat seizures in adults or children with epilepsy. It is also used for the prevention of migraine headaches. This medicine may be used for other purposes; ask your health care provider or pharmacist if you have questions. COMMON BRAND NAME(S): Topamax, Topiragen What should I tell my health care provider before I take this medicine? They need to know if you have any of these conditions: -bleeding disorders -cirrhosis of the liver or liver disease -diarrhea -glaucoma -kidney stones or kidney disease -low blood counts, like low white cell, platelet, or red cell counts -lung disease like asthma, obstructive pulmonary disease, emphysema -metabolic acidosis -on a ketogenic diet -schedule for surgery or a procedure -suicidal thoughts, plans, or attempt; a previous suicide attempt by you or a family member -an unusual or allergic reaction to topiramate, other medicines, foods, dyes, or preservatives -pregnant or trying to get pregnant -breast-feeding How should I use this medicine? Take this medicine by mouth with a glass of water. Follow the directions on the prescription label. Do not crush or chew. You may take this medicine with meals. Take your medicine at regular intervals. Do not take it more often than  directed. Talk to your pediatrician regarding the use of this medicine in children. Special care may be needed. While this drug may be prescribed for children as young as 25 years of age for selected conditions, precautions do apply. Overdosage: If you think you have taken too much of this medicine contact a poison control center or emergency room at once. NOTE: This medicine is only for you. Do not share this medicine with others. What if I miss a dose? If you miss a dose, take it as soon as you can. If your next dose is to be taken in less than 6 hours, then do not take the missed dose. Take the next dose at your regular time. Do not take double or extra doses. What may interact with this medicine? Do not take this medicine with any of the following medications: -probenecid This medicine may also interact with the following medications: -acetazolamide -alcohol -amitriptyline -aspirin and aspirin-like medicines -birth control pills -certain medicines for depression -certain medicines for seizures -certain medicines that treat or prevent blood clots like warfarin, enoxaparin, dalteparin, apixaban, dabigatran, and rivaroxaban -digoxin -hydrochlorothiazide -lithium -medicines for pain, sleep, or muscle relaxation -metformin -methazolamide -NSAIDS, medicines for pain and inflammation, like ibuprofen or naproxen -pioglitazone -risperidone This list may not describe all possible interactions. Give your health care provider a list of all the medicines, herbs, non-prescription drugs, or dietary supplements you use. Also tell them if you smoke, drink alcohol, or use illegal drugs. Some items may interact with your medicine. What should I watch for while using this medicine?  Visit your doctor or health care professional for regular checks on your progress. Do not stop taking this medicine suddenly. This increases the risk of seizures if you are using this medicine to control epilepsy. Wear a  medical identification bracelet or chain to say you have epilepsy or seizures, and carry a card that lists all your medicines. This medicine can decrease sweating and increase your body temperature. Watch for signs of deceased sweating or fever, especially in children. Avoid extreme heat, hot baths, and saunas. Be careful about exercising, especially in hot weather. Contact your health care provider right away if you notice a fever or decrease in sweating. You should drink plenty of fluids while taking this medicine. If you have had kidney stones in the past, this will help to reduce your chances of forming kidney stones. If you have stomach pain, with nausea or vomiting and yellowing of your eyes or skin, call your doctor immediately. You may get drowsy, dizzy, or have blurred vision. Do not drive, use machinery, or do anything that needs mental alertness until you know how this medicine affects you. To reduce dizziness, do not sit or stand up quickly, especially if you are an older patient. Alcohol can increase drowsiness and dizziness. Avoid alcoholic drinks. If you notice blurred vision, eye pain, or other eye problems, seek medical attention at once for an eye exam. The use of this medicine may increase the chance of suicidal thoughts or actions. Pay special attention to how you are responding while on this medicine. Any worsening of mood, or thoughts of suicide or dying should be reported to your health care professional right away. This medicine may increase the chance of developing metabolic acidosis. If left untreated, this can cause kidney stones, bone disease, or slowed growth in children. Symptoms include breathing fast, fatigue, loss of appetite, irregular heartbeat, or loss of consciousness. Call your doctor immediately if you experience any of these side effects. Also, tell your doctor about any surgery you plan on having while taking this medicine since this may increase your risk for  metabolic acidosis. Birth control pills may not work properly while you are taking this medicine. Talk to your doctor about using an extra method of birth control. Women who become pregnant while using this medicine may enroll in the [place] Pregnancy Registry by calling [phone]. This registry collects information about the safety of antiepileptic drug use during pregnancy. What side effects may I notice from receiving this medicine? Side effects that you should report to your doctor or health care professional as soon as possible: -allergic reactions like skin rash, itching or hives, swelling of the face, lips, or tongue -decreased sweating and/or rise in body temperature -depression -difficulty breathing, fast or irregular breathing patterns -difficulty speaking -difficulty walking or controlling muscle movements -hearing impairment -redness, blistering, peeling or loosening of the skin, including inside the mouth -tingling, pain or numbness in the hands or feet -unusual bleeding or bruising -unusually weak or tired -worsening of mood, thoughts or actions of suicide or dying Side effects that usually do not require medical attention (report to your doctor or health care professional if they continue or are bothersome): -altered taste -back pain, joint or muscle aches and pains -diarrhea, or constipation -headache -loss of appetite -nausea -stomach upset, indigestion -tremors This list may not describe all possible side effects. Call your doctor for medical advice about side effects. You may report side effects to FDA at 1-800-FDA-1088. Where should I keep my  medicine? Keep out of the reach of children. Store at room temperature between 15 and 30 degrees C (59 and 86 degrees F) in a tightly closed container. Protect from moisture. Throw away any unused medicine after the expiration date. NOTE: This sheet is a summary. It may not cover all possible  information. If you have questions about this medicine, talk to your doctor, pharmacist, or health care provider.  2018 Elsevier/Gold Standard (2013-03-06 23:17:57)

## 2018-02-25 ENCOUNTER — Ambulatory Visit: Payer: BLUE CROSS/BLUE SHIELD | Admitting: Family Medicine

## 2018-02-25 ENCOUNTER — Encounter: Payer: Self-pay | Admitting: Family Medicine

## 2018-02-25 VITALS — BP 128/82 | HR 61 | Temp 98.0°F | Wt 286.0 lb

## 2018-02-25 DIAGNOSIS — E049 Nontoxic goiter, unspecified: Secondary | ICD-10-CM

## 2018-02-25 DIAGNOSIS — H9201 Otalgia, right ear: Secondary | ICD-10-CM | POA: Diagnosis not present

## 2018-02-25 NOTE — Progress Notes (Signed)
   Subjective:    Patient ID: Denise Schroeder, female    DOB: 08/17/1965, 52 y.o.   MRN: [id number]  HPI She complains of a 2-day history of right earache, slight sore throat, chills, myalgias.  No coughing.   Review of Systems     Objective:   Physical Exam Alert and in no distress. Tympanic membranes and canals are normal. Pharyngeal area is normal. Neck is supple without adenopathy' thyromegaly noted. Cardiac exam shows a regular sinus rhythm without murmurs or gallops. Lungs are clear to auscultation.        Assessment & Plan:  Goiter  Otalgia of right ear I explained that I think this is probably more viral and recommend supportive care.  She will call with further trouble.

## 2018-05-13 ENCOUNTER — Ambulatory Visit: Payer: BLUE CROSS/BLUE SHIELD | Admitting: Medical

## 2018-05-26 ENCOUNTER — Ambulatory Visit: Payer: BLUE CROSS/BLUE SHIELD | Admitting: Medical

## 2018-05-31 ENCOUNTER — Telehealth: Payer: Self-pay | Admitting: Medical

## 2018-05-31 NOTE — Telephone Encounter (Signed)
Armando dropped off some forms from [facility] that she needs you to fill out and fax back to [facility] Coming back in your folder  Please also let patient know when forms have been sent  Her BC policy does not cover her office and will not even cover bloodwork that we order She spoke to [facility] and explained to them that she has to have bloodwork done twice a year because of her thyroid. They told her to have you complete these forms and fax back and they will try to get approval to cover her bloodwork

## 2018-06-01 NOTE — Telephone Encounter (Signed)
Fill in the other parts, she needs to sign, and return

## 2018-06-01 NOTE — Telephone Encounter (Signed)
Called pt, informed her Denise Schroeder completed his part of the forms she dropped off. She will pick them up and complete her part  Forms left in folder in front office for pick up

## 2018-07-27 ENCOUNTER — Telehealth: Payer: Self-pay

## 2018-07-27 NOTE — Telephone Encounter (Signed)
Patient called my voicemail and states that she is having stomach pain at the top of stomach and that you prescribed her something before and wants to know if you will send in a refill to her pharmacy.  She can be reached at [phone].

## 2018-07-28 ENCOUNTER — Other Ambulatory Visit: Payer: Self-pay | Admitting: Medical

## 2018-07-28 ENCOUNTER — Telehealth: Payer: Self-pay | Admitting: Medical

## 2018-07-28 MED ORDER — OMEPRAZOLE 40 MG PO CPDR: 40.0000 mg | DELAYED_RELEASE_CAPSULE | Freq: Every day | ORAL | 1 refills | Status: DC

## 2018-07-28 MED ORDER — ONDANSETRON HCL 4 MG PO TABS: 4.0000 mg | ORAL_TABLET | Freq: Three times a day (TID) | ORAL | 0 refills | Status: DC | PRN

## 2018-07-28 NOTE — Telephone Encounter (Signed)
Patient notified of recommendations and that RX have been sent to her pharmacy.    I asked her to call back in a week or 2 to give update on symptoms.

## 2018-07-28 NOTE — Telephone Encounter (Signed)
error 

## 2018-07-28 NOTE — Telephone Encounter (Signed)
Not real sure which medicine you are referring to.  If she is having indigestion or acid reflux or upper belly pain or nausea, I sent omeprazole acid reflux medicine as well as Zofran for nausea to your pharmacy does now.  If not the symptoms please let me know

## 2018-09-22 ENCOUNTER — Other Ambulatory Visit: Payer: Self-pay | Admitting: Medical

## 2018-11-02 ENCOUNTER — Ambulatory Visit (INDEPENDENT_AMBULATORY_CARE_PROVIDER_SITE_OTHER): Payer: BLUE CROSS/BLUE SHIELD | Admitting: Medical

## 2018-11-02 ENCOUNTER — Other Ambulatory Visit: Payer: Self-pay

## 2018-11-02 ENCOUNTER — Other Ambulatory Visit: Payer: Self-pay | Admitting: Medical

## 2018-11-02 ENCOUNTER — Encounter: Payer: Self-pay | Admitting: Medical

## 2018-11-02 VITALS — BP 120/74 | HR 79 | Temp 99.1°F | Resp 16 | Ht 68.0 in | Wt 288.0 lb

## 2018-11-02 DIAGNOSIS — E049 Nontoxic goiter, unspecified: Secondary | ICD-10-CM

## 2018-11-02 DIAGNOSIS — E669 Obesity, unspecified: Secondary | ICD-10-CM

## 2018-11-02 DIAGNOSIS — Z7185 Encounter for immunization safety counseling: Secondary | ICD-10-CM

## 2018-11-02 DIAGNOSIS — Z7189 Other specified counseling: Secondary | ICD-10-CM | POA: Diagnosis not present

## 2018-11-02 DIAGNOSIS — E039 Hypothyroidism, unspecified: Secondary | ICD-10-CM | POA: Diagnosis not present

## 2018-11-02 DIAGNOSIS — Z87892 Personal history of anaphylaxis: Secondary | ICD-10-CM

## 2018-11-02 MED ORDER — EPINEPHRINE 0.3 MG/0.3ML IJ SOAJ: 0.3000 mg | INTRAMUSCULAR | 1 refills | Status: DC | PRN

## 2018-11-02 NOTE — Patient Instructions (Signed)
Exercise: I recommend exercising most days of the week using a type of exercise that you would enjoy and stick to such as walking, running, swimming, hiking, biking, aerobics, etc. This needs to be at least 30-40 minutes at a time, at least 5 days/week with moderate intensity.  This would be 60-70% of your maximum heart rate.  For example, your maximal heart rate would be 200- your age, multiplied x 0.6 to equal 60% of your maximal heart rate.    Thus, a person age 56 would have a maximum heart rate of 160.  60% of this would be 96 beats per minute.  Thus for fat burning exercise, a 53 year old would want to exercise has sustained heart rate of about 96 bpm for fat burning.  However for heart health, they would want to exercise at a higher heart rate of about 75% of maximum heart rate which would be a heart rate of about 120 beats per minute.  So I recommend a combination of doing some exercise at moderate intensity, and some exercise at vigorous intensity for heart health.   Low Carb Diet recommendations:  I recommend you drink water throughout the day.   70 ounces or 2 liters would be a good amount.  If you have been accustomed to drinking juice or soda, try water with lemon or water with lime, or try using no calorie flavor dropper.  I recommend the following as an example meal plan that includes 3 meals per day.   You can skip some meals periodically for intermittent fasting.  Breakfast (choose one): Marland Kitchen Omelette with egg, can include a little bit of cheese, your choice of mushrooms, peppers, onions, salsa . Smoothie with handful of spinach or kale, 1 cup of milk or water, 1 cup of berries, 1 packet of artificial sweetener such as stevia . Yogurt with fruit   Mid morning snack: . 1 fruit serving and 1 protein such as 8 almonds or 8 nuts, or vegetable such as carrots and humus or other similar vegetable   Lunch: . Salad with 3-4 ounces of lean grilled or baked meat such as fish, chicken or  [place]   Mid afternoon snack: . 1 fruit serving and 1 protein such as 8 almonds or 8 nuts, or vegetable such as carrots and humus or other similar vegetable   Dinner: . Large serving of vegetables and 3-4 ounces of lean grilled or baked meat such as fish, chicken or [place] . Or vegetarian dish without meat    Avoid  . Chips, cookies, cake, donuts, soda, sweet tea, juices, candy, fast food . For now , avoid, or significantly limit grains

## 2018-11-02 NOTE — Progress Notes (Signed)
Subjective:  Denise Schroeder is a 53 y.o. female who presents for Chief Complaint  Patient presents with  . follow up    follow up      Here today for med check.  She notes an issue with her insurance where her insurance may not be in effect here but only through [place] offices so she wants to limit charges today.  Hypothyroidism-compliant with levothyroxine 100 mcg daily without complaint.  She notes no problems with the medication, feeling fine.  She is excited about her progress with exercise.  She is working with a Clinical research associate 4 days/week, doing a variety of exercise.  She is doing at least 60 push-ups daily, at least 200 crunches daily has been working with the trainer for over a year now.  She is on a variety of cardio and weights and strength training.  She is recently dropped 2 pant sizes.  She has lost some weight recently as well.  She has continued to try to eat healthy.  Typically breakfast includes boiled eggs and spinach, sandwiches often tomato and lettuce sandwich, she does not always eat dinner though.  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  Every now and then she cheats and eats junk food.  She drinks a lot of water.  She avoids fruit due to prior anaphylaxis with citrus fruits.  She had allergy testing done back in 2015 allergist.  No other new complaint  The following portions of the patient's history were reviewed and updated as appropriate: allergies, current medications, past family history, past medical history, past social history, past surgical history and problem list.  ROS Otherwise as in subjective above   Past Medical History:  Diagnosis Date  . Allergy   . Anemia   . GERD (gastroesophageal reflux disease)   . Goiter   . Hashimoto thyroiditis   . Obesity   . Uterine fibroid    Current Outpatient Medications on File Prior to Visit  Medication Sig Dispense  Refill  . CALCIUM PO Take 2 tablets by mouth at bedtime.     Marland Kitchen levothyroxine (SYNTHROID, LEVOTHROID) 100 MCG tablet take 1 tablet by mouth once daily before BREAKFAST 90 tablet 3  . loratadine (ALLERGY) 10 MG tablet Take 10 mg by mouth daily.    . Multiple Vitamin (MULTIVITAMIN WITH MINERALS) TABS Take 1 tablet by mouth every morning.     . triamcinolone cream (KENALOG) 0.1 % Apply 1 application topically 2 (two) times daily. 30 g 0   Current Facility-Administered Medications on File Prior to Visit  Medication Dose Route Frequency Provider Last Rate Last Dose  . EPINEPHrine (EPI-PEN) injection 0.3 mg  0.3 mg Intramuscular Once Carlena Hurl, PA-C        Objective: BP 120/74   Pulse 79   Temp 99.1 F (37.3 C) (Oral)   Resp 16   Ht 5\' 8"  (1.727 m)   Wt 288 lb (130.6 kg)   SpO2 98%   BMI 43.79 kg/m   General appearance: alert, no distress, well developed, well nourished Neck: supple, no lymphadenopathy, no thyromegaly, no masses Heart: RRR, normal S1, S2, no murmurs Lungs: CTA bilaterally, no wheezes, rhonchi, or rales Abdomen: +bs, soft, non tender, non distended, no masses, no hepatomegaly, no splenomegaly Pulses: 2+ radial pulses, 2+ pedal pulses, normal cap refill Ext: no edema   Assessment: Encounter Diagnoses  Name  Primary?  . Hypothyroidism, unspecified type Yes  . History of anaphylaxis   . Vaccine counseling   . Goiter   . Obesity with serious comorbidity, unspecified classification, unspecified obesity type      Plan: Hypothyroidism last labs today, continue current medication  History of anaphylaxis-we will request prior labs from allergist in 2015, refilled EpiPen, discussed proper use  Vaccine counseling Advise yearly flu shot  Shingles vaccine:  I recommend you have a shingles vaccine to help prevent shingles or herpes zoster outbreak.   Please call your insurer to inquire about coverage for the Shingrix vaccine given in 2 doses.   Some insurers  cover this vaccine after age 53, some cover this after age 30.  If your insurer covers this, then call to schedule appointment to have this vaccine here.   Obesity- congratulated her on her continued efforts for healthy diet, exercise and weight loss  I reviewed her 2019 Pap smear and mammogram that are on file as well as her colonoscopy that is on file and up-to-date  Hiba was seen today for follow up.  Diagnoses and all orders for this visit:  Hypothyroidism, unspecified type -     TSH -     T4, free  History of anaphylaxis  Vaccine counseling  Goiter  Obesity with serious comorbidity, unspecified classification, unspecified obesity type  Other orders -     EPINEPHrine (EPIPEN 2-PAK) 0.3 mg/0.3 mL IJ SOAJ injection; Inject 0.3 mLs (0.3 mg total) into the muscle as needed for anaphylaxis.    Follow up: pending labs, and plan to return when new insurance is in effect for full physical and full labs

## 2018-11-03 ENCOUNTER — Other Ambulatory Visit: Payer: Self-pay | Admitting: Medical

## 2018-11-03 LAB — T4, FREE: Free T4: 1.29 ng/dL (ref 0.82–1.77)

## 2018-11-03 LAB — TSH: TSH: 2.14 u[IU]/mL (ref 0.450–4.500)

## 2018-11-03 MED ORDER — LEVOTHYROXINE SODIUM 100 MCG PO TABS: ORAL_TABLET | ORAL | 3 refills | Status: DC

## 2018-11-04 ENCOUNTER — Other Ambulatory Visit: Payer: Self-pay

## 2018-11-10 ENCOUNTER — Other Ambulatory Visit: Payer: Self-pay

## 2018-11-10 DIAGNOSIS — Z20822 Contact with and (suspected) exposure to covid-19: Secondary | ICD-10-CM

## 2018-11-12 LAB — NOVEL CORONAVIRUS, NAA: SARS-CoV-2, NAA: NOT DETECTED

## 2018-11-25 ENCOUNTER — Telehealth: Payer: Self-pay | Admitting: Medical

## 2018-11-25 NOTE — Telephone Encounter (Signed)
Requested records received from [facility].

## 2018-11-28 ENCOUNTER — Telehealth: Payer: Self-pay | Admitting: Medical

## 2018-11-28 NOTE — Telephone Encounter (Signed)
I went back over her records that came in from allergy and asthma clinic in 2016 visit:  Of note she had hypersensitivities to grass, weeds, trees, house dust mites, horse, and slight hypersensitivity against cat and dog dander.  Regarding fruits which we discussed when she was here, she had some mild sensitivities to strawberries, green peas, grapes, watermelon, pineapple and oranges.  However she seemed to not have allergies to bananas, apples, peach, carrots, celery.  She may want to call allergy and asthma clinic just to verify from the 2016 labs.  I am placing my interpretation off of the lab results they sent me.

## 2018-11-28 NOTE — Telephone Encounter (Signed)
Patient has scheduled an appointment to discuss sob after drinking orange juice.

## 2018-11-28 NOTE — Telephone Encounter (Signed)
Patient has been informed of provider message.  

## 2018-11-29 ENCOUNTER — Other Ambulatory Visit: Payer: Self-pay

## 2018-11-29 ENCOUNTER — Ambulatory Visit (INDEPENDENT_AMBULATORY_CARE_PROVIDER_SITE_OTHER): Payer: BLUE CROSS/BLUE SHIELD | Admitting: Medical

## 2018-11-29 ENCOUNTER — Encounter: Payer: Self-pay | Admitting: Medical

## 2018-11-29 VITALS — BP 112/80 | HR 79 | Temp 97.8°F | Ht 68.0 in | Wt 291.4 lb

## 2018-11-29 DIAGNOSIS — R0602 Shortness of breath: Secondary | ICD-10-CM

## 2018-11-29 DIAGNOSIS — T7840XA Allergy, unspecified, initial encounter: Secondary | ICD-10-CM

## 2018-11-29 DIAGNOSIS — Z23 Encounter for immunization: Secondary | ICD-10-CM

## 2018-11-29 DIAGNOSIS — Z91018 Allergy to other foods: Secondary | ICD-10-CM

## 2018-11-29 MED ORDER — OLOPATADINE HCL 0.2 % OP SOLN: 1.0000 [drp] | Freq: Every day | OPHTHALMIC | 2 refills | Status: DC

## 2018-11-29 MED ORDER — TRIAMCINOLONE ACETONIDE 0.1 % EX CREA: 1.0000 "application " | TOPICAL_CREAM | Freq: Two times a day (BID) | CUTANEOUS | 0 refills | Status: DC

## 2018-11-29 NOTE — Patient Instructions (Addendum)
Allergic reaction  For mild reactions, itching, hives, without lip or facial swelling, without shortness of breath, use Benadryl 25mg  tablet 2 or 3 times daily  Hydrate well with water  You can use traimcinolone cream or OTC hydrocortisone for rash other than not use Triamcinoline on the face  For reaction such as lip and facial swelling, wheezing, shortness of breath, use the above benadryl, but use Epipen if needed and call doctor office or emergency dept.  Consider going back to allergies about desensitization or treatment to over ride allergies on your prior allergy testing  For itchy water or red eyes, begin Pataday eye drops OTC   For elbow, use Cetaphil or Aquaphor lotion daily, and for flrae ups, use the triamcinolone cream  From 2016 allergy clinic notes....  Of note she had hypersensitivities to grass, weeds, trees, house dust mites, horse, and slight hypersensitivity against cat and dog dander.  Regarding fruits which we discussed when she was here, she had some mild sensitivities to strawberries, green peas, grapes, watermelon, pineapple and oranges.  However she seemed to not have allergies to bananas, apples, peach, carrots, celery.  She may want to call allergy and asthma clinic just to verify from the 2016 labs.  I am placing my interpretation off of the lab results they sent me.

## 2018-11-29 NOTE — Progress Notes (Signed)
  Saturday night, Saturday evening  Subjective:  Denise Schroeder is a 53 y.o. female who presents for Chief Complaint  Patient presents with  . Shortness of Breath    after drinking orange juice(citrus allergy)      Here today for possible allergic reaction.  She notes 4 days ago felt short of breath shortly after drinking orange juice.  She denies rash, itching, facial swelling, lip swelling tongue swelling.  No symptoms of shortness of breath.  She did take Benadryl the next several days.  Her symptoms have significantly improved and not short of breath now.  She notes history of allergies to several environmental and food allergies. No other aggravating or relieving factors.    No other c/o.  The following portions of the patient's history were reviewed and updated as appropriate: allergies, current medications, past family history, past medical history, past social history, past surgical history and problem list.  ROS Otherwise as in subjective above  Objective: BP 112/80   Pulse 79   Temp 97.8 F (36.6 C)   Ht 5\' 8"  (1.727 m)   Wt 291 lb 6.4 oz (132.2 kg)   SpO2 98%   BMI 44.31 kg/m   General appearance: alert, no distress, well developed, well nourished HEENT: normocephalic, sclerae anicteric, conjunctiva pink and moist, TMs pearly, nares patent, no discharge or erythema, pharynx normal Oral cavity: MMM, no lesions Neck: supple, no lymphadenopathy, no thyromegaly, no masses Heart: RRR, normal S1, S2, no murmurs Lungs: CTA bilaterally, no wheezes, rhonchi, or rales Pulses: 2+ radial pulses, 2+ pedal pulses, normal cap refill Ext: no edema   Assessment: Encounter Diagnoses  Name Primary?  . Allergic reaction, initial encounter Yes  . Food allergy   . SOB (shortness of breath)   . Need for influenza vaccination      Plan: I reviewed back over the allergy labs that we had requested from 2016 allergy and asthma clinic visit.  I just received these last week.  She  does have a history of allergies to oranges and several other citrus type foods.  I advise she take Benadryl a couple days once daily.  In the future we discussed having on hand Benadryl for immediate use up to 4 times a day for allergic reaction, she has an EpiPen and voices understanding of proper use as we discussed, and we discussed symptoms that would warrant using EpiPen.  We discussed her environmental and food allergies from prior lab testing.  I advise she consider recheck with allergy and asthma clinic to discuss other treatment recommendations and options  Counseled on the influenza virus vaccine.  Vaccine information sheet given.  Influenza vaccine given after consent obtained.   [name] was seen today for shortness of breath.  Diagnoses and all orders for this visit:  Allergic reaction, initial encounter  Food allergy  SOB (shortness of breath)  Need for influenza vaccination  Other orders -     triamcinolone cream (KENALOG) 0.1 %; Apply 1 application topically 2 (two) times daily. -     Olopatadine HCl 0.2 % SOLN; Apply 1 drop to eye daily. -     Flu Vaccine QUAD 6+ mos PF IM (Fluarix Quad PF)    Follow up: prn

## 2018-11-30 DIAGNOSIS — Z91018 Allergy to other foods: Secondary | ICD-10-CM | POA: Insufficient documentation

## 2018-11-30 DIAGNOSIS — R0602 Shortness of breath: Secondary | ICD-10-CM | POA: Insufficient documentation

## 2018-11-30 DIAGNOSIS — T7840XA Allergy, unspecified, initial encounter: Secondary | ICD-10-CM | POA: Insufficient documentation

## 2019-01-11 ENCOUNTER — Telehealth: Payer: Self-pay | Admitting: Medical

## 2019-01-11 NOTE — Telephone Encounter (Signed)
Pt informed and she will go for testing

## 2019-01-11 NOTE — Telephone Encounter (Signed)
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   she can go to the [employer] site.  Tell her about this.

## 2019-01-11 NOTE — Telephone Encounter (Signed)
Pt states having problems with sinuses, sinuses stopped up, can't smell, some drainage, nothing coming out.  Advised loss of smell is sx of COVID.  She states was tested last month.  Been taking Allegra since April thinks is immuned to it.  Said eyes cleared up.  Nothing hurts, no fever, no cough. Flonase makes her feel drained, has taken it a couple times. Would like something called in [employer]

## 2019-02-08 ENCOUNTER — Other Ambulatory Visit: Payer: Self-pay

## 2019-02-08 ENCOUNTER — Encounter: Payer: Self-pay | Admitting: Medical

## 2019-02-08 ENCOUNTER — Ambulatory Visit (INDEPENDENT_AMBULATORY_CARE_PROVIDER_SITE_OTHER): Payer: BLUE CROSS/BLUE SHIELD | Admitting: Medical

## 2019-02-08 VITALS — Temp 97.1°F | Wt 290.0 lb

## 2019-02-08 DIAGNOSIS — J019 Acute sinusitis, unspecified: Secondary | ICD-10-CM

## 2019-02-08 DIAGNOSIS — Z20822 Contact with and (suspected) exposure to covid-19: Secondary | ICD-10-CM

## 2019-02-08 DIAGNOSIS — Z20828 Contact with and (suspected) exposure to other viral communicable diseases: Secondary | ICD-10-CM

## 2019-02-08 MED ORDER — AMOXICILLIN-POT CLAVULANATE 875-125 MG PO TABS: 1.0000 | ORAL_TABLET | Freq: Two times a day (BID) | ORAL | 0 refills | Status: DC

## 2019-02-08 NOTE — Progress Notes (Signed)
Subjective:     Patient ID: Denise Schroeder, female   DOB: [birth year], 53 y.o.   MRN: [id number]  This visit type was conducted due to national recommendations for restrictions regarding the COVID-19 Pandemic (e.g. social distancing) in an effort to limit this patient's exposure and mitigate transmission in our community.  Due to their co-morbid illnesses, this patient is at least at moderate risk for complications without adequate follow up.  This format is felt to be most appropriate for this patient at this time.    Documentation for virtual audio and video telecommunications through Zoom encounter:  The patient was located at home. The provider was located in the office. The patient did consent to this visit and is aware of possible charges through their insurance for this visit.  The other persons participating in this telemedicine service were none. Time spent on call was 20 minutes and in review of previous records 20 minutes total.  This virtual service is not related to other E/M service within previous 7 days.   HPI Chief Complaint  Patient presents with  . sinus infection    possible sinus infection x 2 weeks. stopped, some cough, yellow mucous, taking otc robtussion, mucinex,   Virtual consult today for possible sinus infection.  Started 2 weeks ago with sinus pressure, yellow mucous, stopped up feeling, some cough, some hoarse throat, using robitussin, mucinex at times.   Has had some scratchy throat from drainage.   No fever.   No nausea, no vomiting.  No body aches, no chills, feels decreased smell just from the congestion, but no sudden loss of taste or smell.   No sick contacts.   She owns a daycare.   One of her workers who is asymptomatic tested + for covid 02/01/2019, so they had to close the daycare for the time being.  They had all been wearing masks.     Went for [employer] test yesterday given the potential exposure.    No other aggravating or relieving factors. No  other complaint.  Past Medical History:  Diagnosis Date  . Allergy   . Anemia   . GERD (gastroesophageal reflux disease)   . Goiter   . Hashimoto thyroiditis   . Obesity   . Uterine fibroid    Current Outpatient Medications on File Prior to Visit  Medication Sig Dispense Refill  . CALCIUM PO Take 2 tablets by mouth at bedtime.     Marland Kitchen EPINEPHrine (EPIPEN 2-PAK) 0.3 mg/0.3 mL IJ SOAJ injection Inject 0.3 mLs (0.3 mg total) into the muscle as needed for anaphylaxis. 1 each 1  . levothyroxine (SYNTHROID) 100 MCG tablet take 1 tablet by mouth once daily before BREAKFAST 90 tablet 3  . loratadine (ALLERGY) 10 MG tablet Take 10 mg by mouth daily.    . Multiple Vitamin (MULTIVITAMIN WITH MINERALS) TABS Take 1 tablet by mouth every morning.     . triamcinolone cream (KENALOG) 0.1 % Apply 1 application topically 2 (two) times daily. 45 g 0   Current Facility-Administered Medications on File Prior to Visit  Medication Dose Route Frequency Provider Last Rate Last Dose  . EPINEPHrine (EPI-PEN) injection 0.3 mg  0.3 mg Intramuscular Once Kassim Guertin, Camelia Eng, PA-C        Review of Systems As in subjective    Objective:   Physical Exam Due to coronavirus pandemic stay at home measures, patient visit was virtual and they were not examined in person.   Temp (!) 97.1 F (36.2 C)  Wt 290 lb (131.5 kg)   BMI 44.09 kg/m       Assessment:     Encounter Diagnoses  Name Primary?  . Acute sinusitis, recurrence not specified, unspecified location Yes  . COVID-19 virus test result unknown        Plan:     Her symptoms suggests sinusitis.  Begin augmentin, rest, hydrate well, begin nasal saline flush and salt water gargles.  advised to quarantine in the event this could be covid.   She had an asymptomatic covid contact.  Although they were wearing masks, she was around the person for at least 1 day.  She is awaiting covid test.  She will call back with results next week.  We discuss quarantine  and safety measure for thanksgiving weekend as well as she had several questions about this.   Self Quarantine: The CDC, Centers for Disease Control has recommended a self quarantine of 14 days from the start of exposure.  What does self quarantine mean: avoiding contact with people as much as possible.   Particularly in your house, isolate your self from others in a separate room, wear a mask when possible in the room, particularly if coughing a lot.   Have others bring food, water, medications, etc., to your door, but avoid direct contact with your household contacts during this time to avoid spreading the infection to them.   If you have a separate bathroom and living quarters during the next 2 weeks away from others, that would be preferable.    If you can't completely isolate, then wear a mask, wash hands frequently with soap and water for at least 15 seconds, minimize close contact with others, and have a friend or family member check regularly from a distance to make sure you are not getting seriously worse.     You should not be going out in public, should not be going to stores, to work or other public places until all your symptoms have resolved and at least 10 days + 24 hours of no symptoms at all have transpired.   Ideally you should avoid contact with others for a full 10 days if possible.  One of the goals is to limit spread to high risk people; people that are older and elderly, people with multiple health issues like diabetes, heart disease, lung disease, and anybody that has weakened immune systems such as people with cancer or on immunosuppressive therapy.      Adiline was seen today for sinus infection.  Diagnoses and all orders for this visit:  Acute sinusitis, recurrence not specified, unspecified location  COVID-19 virus test result unknown  Other orders -     amoxicillin-clavulanate (AUGMENTIN) 875-125 MG tablet; Take 1 tablet by mouth 2 (two) times daily.

## 2019-02-15 ENCOUNTER — Telehealth: Payer: Self-pay | Admitting: Medical

## 2019-02-15 NOTE — Telephone Encounter (Signed)
Pt called to advise her COVID test was negative.

## 2019-02-15 NOTE — Telephone Encounter (Signed)
Good to hear, hope she is feeling much better

## 2019-03-09 ENCOUNTER — Other Ambulatory Visit: Payer: Self-pay

## 2019-03-09 ENCOUNTER — Ambulatory Visit (INDEPENDENT_AMBULATORY_CARE_PROVIDER_SITE_OTHER): Payer: BLUE CROSS/BLUE SHIELD | Admitting: Medical

## 2019-03-09 VITALS — Ht 68.0 in

## 2019-03-09 DIAGNOSIS — R0600 Dyspnea, unspecified: Secondary | ICD-10-CM | POA: Diagnosis not present

## 2019-03-09 DIAGNOSIS — R0981 Nasal congestion: Secondary | ICD-10-CM

## 2019-03-09 DIAGNOSIS — R05 Cough: Secondary | ICD-10-CM

## 2019-03-09 DIAGNOSIS — R059 Cough, unspecified: Secondary | ICD-10-CM

## 2019-03-09 MED ORDER — EMERGEN-C IMMUNE PLUS PO PACK: 1.0000 | PACK | Freq: Two times a day (BID) | ORAL | 0 refills | Status: DC

## 2019-03-09 MED ORDER — PREDNISONE 10 MG PO TABS: ORAL_TABLET | ORAL | 0 refills | Status: DC

## 2019-03-09 MED ORDER — ALBUTEROL SULFATE HFA 108 (90 BASE) MCG/ACT IN AERS: 2.0000 | INHALATION_SPRAY | Freq: Four times a day (QID) | RESPIRATORY_TRACT | 0 refills | Status: DC | PRN

## 2019-03-09 NOTE — Progress Notes (Signed)
Subjective:     Patient ID: Denise Schroeder, female   DOB: 1965-06-07, 54 y.o.   MRN: [id number]  This visit type was conducted due to national recommendations for restrictions regarding the COVID-19 Pandemic (e.g. social distancing) in an effort to limit this patient's exposure and mitigate transmission in our community.  Due to their co-morbid illnesses, this patient is at least at moderate risk for complications without adequate follow up.  This format is felt to be most appropriate for this patient at this time.    Documentation for virtual audio and video telecommunications through Zoom encounter:  The patient was located at home. The provider was located in the office. The patient did consent to this visit and is aware of possible charges through their insurance for this visit.  The other persons participating in this telemedicine service were none. Time spent on call was 20 minutes and in review of previous records 20 minutes total.  This virtual service is not related to other E/M service within previous 7 days.   HPI Chief Complaint  Patient presents with  . Shortness of Breath    with wheezing    Virtual consult today for follow-up.  I talked to her about a month ago for mild symptoms suggesting possible URI but she had had an asymptomatic Covid exposure.  She tested negative.  We ended up doing a round of antibiotic for possible sinus infection.  Over the last few weeks though she has continued to have symptoms including nasal congestion, no stopped up, having trouble breathing through her nose and some shortness of breath in general.  She denies fever, no nausea or vomiting, no body aches or chills.  She is coughing, she notes her urine is clear, drinking plenty of fluids.  Has been using her nasal sprays daily including Flonase.  Has been using Sudafed and Mucinex.  She did end up losing her smell that she attributes mostly to congestion.  Appetite is okay.  No other aggravating  or relieving factors. No other complaint.  ROS as in subjective  Review of Systems As in subjective    Objective:   Physical Exam Due to coronavirus pandemic stay at home measures, patient visit was virtual and they were not examined in person.       Assessment:     Encounter Diagnoses  Name Primary?  . Cough Yes  . Dyspnea, unspecified type   . Nasal congestion        Plan:     We discussed her symptoms and concerns.  She had a Covid contact that was asymptomatic about a month ago.  We treated her for sinus infection and she had felt like she never got fully resolved.  We discussed the possibility that this still could have been Covid despite negative test.  Given her current symptoms we will begin medications below prednisone in emergen-C over-the-counter.  If needed she can use albuterol that was also sent today.  Discussed proper use of medications.  Continue to hydrate well, rest.  Continue self quarantine measures until symptoms have resolved for at least 48 hours.   However if over the weekend if worse, worsening difficulty breathing, chest pain or palpitations or does not seeing improvements in getting worse then go to urgent care or the emergency department  Denise Schroeder was seen today for shortness of breath.  Diagnoses and all orders for this visit:  Cough  Dyspnea, unspecified type  Nasal congestion  Other orders -  Multiple Vitamins-Minerals (EMERGEN-C IMMUNE PLUS) PACK; Take 1 tablet by mouth 2 (two) times daily. -     predniSONE (DELTASONE) 10 MG tablet; 6 tablets once on day 1, 5 tablets day 2, 4 tablets day 3, 3 tablets day 4, 2 tablets day 5, 1 tablet day 6 -     albuterol (VENTOLIN HFA) 108 (90 Base) MCG/ACT inhaler; Inhale 2 puffs into the lungs every 6 (six) hours as needed for wheezing or shortness of breath.

## 2019-03-19 ENCOUNTER — Encounter (HOSPITAL_BASED_OUTPATIENT_CLINIC_OR_DEPARTMENT_OTHER): Payer: Self-pay | Admitting: Emergency Medicine

## 2019-03-19 ENCOUNTER — Emergency Department (HOSPITAL_BASED_OUTPATIENT_CLINIC_OR_DEPARTMENT_OTHER)
Admission: EM | Admit: 2019-03-19 | Discharge: 2019-03-19 | Disposition: A | Discharge status: Home / Self Care | Payer: 59 | Attending: Emergency Medicine | Admitting: Emergency Medicine

## 2019-03-19 ENCOUNTER — Other Ambulatory Visit: Payer: Self-pay

## 2019-03-19 ENCOUNTER — Emergency Department (HOSPITAL_BASED_OUTPATIENT_CLINIC_OR_DEPARTMENT_OTHER): Payer: 59

## 2019-03-19 DIAGNOSIS — Z79899 Other long term (current) drug therapy: Secondary | ICD-10-CM | POA: Insufficient documentation

## 2019-03-19 DIAGNOSIS — J329 Chronic sinusitis, unspecified: Secondary | ICD-10-CM | POA: Diagnosis not present

## 2019-03-19 DIAGNOSIS — R519 Headache, unspecified: Secondary | ICD-10-CM | POA: Insufficient documentation

## 2019-03-19 DIAGNOSIS — M542 Cervicalgia: Secondary | ICD-10-CM | POA: Diagnosis not present

## 2019-03-19 DIAGNOSIS — E039 Hypothyroidism, unspecified: Secondary | ICD-10-CM | POA: Insufficient documentation

## 2019-03-19 MED ORDER — KETOROLAC TROMETHAMINE 30 MG/ML IJ SOLN
30.0000 mg | Freq: Once | INTRAMUSCULAR | Status: AC
  Administered 2019-03-19: 30 mg via INTRAMUSCULAR
  Filled 2019-03-19: qty 1

## 2019-03-19 MED ORDER — METHOCARBAMOL 500 MG PO TABS: 500.0000 mg | ORAL_TABLET | Freq: Three times a day (TID) | ORAL | 0 refills | Status: DC | PRN

## 2019-03-19 NOTE — Discharge Instructions (Signed)
Follow-up with your doctors.  Hopefully the muscle relaxer will help with headache.

## 2019-03-19 NOTE — ED Provider Notes (Signed)
Denise Schroeder Provider Note   CSN: [id number] Arrival date & time: 03/19/19  0753     History Chief Complaint  Patient presents with  . Headache    Denise Schroeder is a 54 y.o. female.  The history is provided by the patient.  Headache Pain location:  L temporal Quality:  Sharp Radiates to:  L neck Associated symptoms: neck pain   Associated symptoms: no abdominal pain and no congestion   Patient presents with headache.  Has had for around the last 3 days.  States it is on the right side of her head.  Dull.  Light does not bother.  Does not tend to get headaches.  States also tenderness in her posterior neck.  No trauma.  States today she does have some chest pain but worries it could just be anxiety.  No numbness or weakness.  States she does feel little dizzy at times however.     Past Medical History:  Diagnosis Date  . Allergy   . Anemia   . GERD (gastroesophageal reflux disease)   . Goiter   . Hashimoto thyroiditis   . Obesity   . Uterine fibroid     Patient Active Problem List   Diagnosis Date Noted  . Allergic reaction 11/30/2018  . Food allergy 11/30/2018  . SOB (shortness of breath) 11/30/2018  . Granuloma annulare 11/10/2017  . Routine general medical examination at a health care facility 05/01/2016  . Vaccine counseling 05/01/2016  . Obesity with serious comorbidity 11/28/2015  . Hypothyroidism 11/28/2015  . History of anaphylaxis 11/28/2015  . Goiter 11/28/2015  . Need for influenza vaccination 11/28/2015  . Uterine fibroid 06/12/2014    Past Surgical History:  Procedure Laterality Date  . CHOLECYSTECTOMY    . COLONOSCOPY  04/2014   normal, Dr. Erskine Emery  . IR GENERIC HISTORICAL  05/31/2014   IR RADIOLOGIST EVAL & MGMT 05/31/2014 [facility] INTERV RAD  . UTERINE FIBROID EMBOLIZATION  2016   Dr. Garwin Brothers     OB History   No obstetric history on file.     Family History  Problem Relation Age of Onset  .  Diabetes Mother   . Hypertension Mother   . Other Father        died in [place] when Denise Schroeder was 54yo  . Hypertension Sister   . Other Brother        died in [place]  . Asthma Son   . Diabetes Maternal Aunt   . Diabetes Maternal Uncle   . Hypertension Sister   . Thyroid disease Sister   . Thyroid disease Sister   . Thyroid disease Sister   . Stroke Neg Hx   . Heart disease Neg Hx   . Hyperlipidemia Neg Hx   . Colon cancer Neg Hx   . Colon polyps Neg Hx   . Kidney disease Neg Hx   . Esophageal cancer Neg Hx   . Gallbladder disease Neg Hx   . Cancer Neg Hx     Social History   Tobacco Use  . Smoking status: Never Smoker  . Smokeless tobacco: Never Used  Substance Use Topics  . Alcohol use: Yes    Alcohol/week: 1.0 standard drinks    Types: 1 Glasses of wine per week    Comment: Occassionally  . Drug use: No    Home Medications Prior to Admission medications   Medication Sig Start Date End Date Taking? Authorizing Provider  albuterol (VENTOLIN HFA) 108 (90  Base) MCG/ACT inhaler Inhale 2 puffs into the lungs every 6 (six) hours as needed for wheezing or shortness of breath. 03/09/19   Tysinger, Camelia Eng, PA-C  amoxicillin-clavulanate (AUGMENTIN) 875-125 MG tablet Take 1 tablet by mouth 2 (two) times daily. Patient not taking: Reported on 03/09/2019 02/08/19   Tysinger, Camelia Eng, PA-C  CALCIUM PO Take 2 tablets by mouth at bedtime.     [provider]  EPINEPHrine (EPIPEN 2-PAK) 0.3 mg/0.3 mL IJ SOAJ injection Inject 0.3 mLs (0.3 mg total) into the muscle as needed for anaphylaxis. 11/02/18   Tysinger, Camelia Eng, PA-C  levothyroxine (SYNTHROID) 100 MCG tablet take 1 tablet by mouth once daily before BREAKFAST 11/03/18   Tysinger, Camelia Eng, PA-C  loratadine (ALLERGY) 10 MG tablet Take 10 mg by mouth daily.    [provider]  methocarbamol (ROBAXIN) 500 MG tablet Take 1 tablet (500 mg total) by mouth every 8 (eight) hours as needed for muscle spasms. 03/19/19   Davonna Belling, MD  Multiple Vitamin (MULTIVITAMIN WITH MINERALS) TABS Take 1 tablet by mouth every morning.     [provider]  Multiple Vitamins-Minerals (EMERGEN-C IMMUNE PLUS) PACK Take 1 tablet by mouth 2 (two) times daily. 03/09/19   Tysinger, Camelia Eng, PA-C  predniSONE (DELTASONE) 10 MG tablet 6 tablets once on day 1, 5 tablets day 2, 4 tablets day 3, 3 tablets day 4, 2 tablets day 5, 1 tablet day 6 03/09/19   Tysinger, Camelia Eng, PA-C  triamcinolone cream (KENALOG) 0.1 % Apply 1 application topically 2 (two) times daily. 11/29/18   Tysinger, Camelia Eng, PA-C    Allergies    Citrus and Latex  Review of Systems   Review of Systems  Constitutional: Negative for appetite change.  HENT: Negative for congestion.   Eyes: Negative for visual disturbance.  Gastrointestinal: Negative for abdominal pain.  Genitourinary: Negative for flank pain.  Musculoskeletal: Positive for neck pain.  Skin: Negative for rash.  Neurological: Positive for headaches.  Psychiatric/Behavioral: Negative for confusion.    Physical Exam Updated Vital Signs BP (!) 112/97 (BP Location: Right Arm)   Pulse 66   Temp 98.7 F (37.1 C) (Oral)   Resp 18   Ht 5\' 8"  (1.727 m)   Wt 126.1 kg   SpO2 97%   BMI 42.27 kg/m   Physical Exam Vitals and nursing note reviewed.  Eyes:     Extraocular Movements: Extraocular movements intact.     Right eye: No nystagmus.     Left eye: No nystagmus.     Pupils: Pupils are equal, round, and reactive to light.  Neck:     Comments: Tenderness over C7 with some swelling. Cardiovascular:     Rate and Rhythm: Regular rhythm.  Pulmonary:     Breath sounds: Normal breath sounds.  Abdominal:     Palpations: There is no mass.  Skin:    General: Skin is warm.  Neurological:     Mental Status: She is alert.     Comments: Awake and appropriate.  Finger-nose intact bilaterally.  Psychiatric:        Mood and Affect: Mood normal.     ED Results / Procedures / Treatments     Labs (all labs ordered are listed, but only abnormal results are displayed) Labs Reviewed - No data to display  EKG EKG Interpretation  Date/Time:  Sunday March 19 2019 08:44:42 EST Ventricular Rate:  60 PR Interval:    QRS Duration: 83 QT Interval:  430 QTC Calculation: 430 R Axis:   58 Text Interpretation: Sinus rhythm Confirmed by Davonna Belling [phone]) on 03/19/2019 9:22:30 AM   Radiology DG Cervical Spine Complete  Result Date: 03/19/2019 CLINICAL DATA:  Headache x 4 days, neck pain radiating into shoulders bilaterally, NKI, no hx injections or surgery EXAM: CERVICAL SPINE - COMPLETE 4+ VIEW COMPARISON:  CT neck 08/09/2015 FINDINGS: Mild straightening of the cervical spine is likely positional. There is no evidence of cervical spine fracture or prevertebral soft tissue swelling. Alignment is normal. No other significant bone abnormalities are identified. IMPRESSION: Negative cervical spine radiographs. Electronically Signed   By: Audie Pinto M.D.   On: 03/19/2019 09:39   CT Head Wo Contrast  Result Date: 03/19/2019 CLINICAL DATA:  Pt complains of headache x 4 days, OTC pain relievers not helping, some associated nausea, denies dizziness, no hx migraines or HTN, denies numbness or tingling into extremities, steady gait, no sensitivity to light or sound EXAM: CT HEAD WITHOUT CONTRAST TECHNIQUE: Contiguous axial images were obtained from the base of the skull through the vertex without intravenous contrast. COMPARISON:  None. FINDINGS: Brain: No evidence of acute infarction, hemorrhage, hydrocephalus, extra-axial collection or mass lesion/mass effect. Vascular: No hyperdense vessel or unexpected calcification. Skull: Normal. Negative for fracture or focal lesion. Sinuses/Orbits: Partial pansinus opacification. Normal appearance of the orbits. Other: None. IMPRESSION: 1. No acute intracranial abnormality. 2. Pansinus disease. Electronically Signed   By: Audie Pinto M.D.   On:  03/19/2019 09:08    Procedures Procedures (including critical care time)  Medications Ordered in ED Medications  ketorolac (TORADOL) 30 MG/ML injection 30 mg (30 mg Intramuscular Given 03/19/19 0851)    ED Course  I have reviewed the triage vital signs and the nursing notes.  Pertinent labs & imaging results that were available during my care of the patient were reviewed by me and considered in my medical decision making (see chart for details).    MDM Rules/Calculators/A&P                      Patient with headache.  Dull right-sided.  Tender over cervical spine.  X-rays reassuring.  Head CT showed pansinusitis.  Has been treated with antibiotics steroids and decongestants already.  I think she can follow-up with PCP for the sinusitis.  Will treat with muscle relaxer for the neck pain and hopefully is causing some of the headache.  Discharge home. Final Clinical Impression(s) / ED Diagnoses Final diagnoses:  Nonintractable headache, unspecified chronicity pattern, unspecified headache type  Chronic sinusitis, unspecified location    Rx / DC Orders ED Discharge Orders         Ordered    methocarbamol (ROBAXIN) 500 MG tablet  Every 8 hours PRN     03/19/19 0958           Davonna Belling, MD 03/19/19 [phone]

## 2019-03-19 NOTE — ED Triage Notes (Signed)
Headache x 3 days with back and chest tightness.

## 2019-03-31 ENCOUNTER — Other Ambulatory Visit: Payer: Self-pay | Admitting: Medical

## 2019-05-22 ENCOUNTER — Telehealth: Payer: Self-pay | Admitting: Medical

## 2019-05-22 NOTE — Telephone Encounter (Signed)
Pt called and wanted to see if she could get another antibiotic called in or did she need another visit. Pt stated that she his having the same symptoms she had at her last visit.

## 2019-05-23 ENCOUNTER — Other Ambulatory Visit: Payer: Self-pay

## 2019-05-23 ENCOUNTER — Ambulatory Visit (INDEPENDENT_AMBULATORY_CARE_PROVIDER_SITE_OTHER): Payer: Self-pay | Admitting: Family Medicine

## 2019-05-23 VITALS — Wt 278.0 lb

## 2019-05-23 DIAGNOSIS — J019 Acute sinusitis, unspecified: Secondary | ICD-10-CM

## 2019-05-23 MED ORDER — AMOXICILLIN-POT CLAVULANATE 875-125 MG PO TABS: 1.0000 | ORAL_TABLET | Freq: Two times a day (BID) | ORAL | 0 refills | Status: DC

## 2019-05-23 NOTE — Progress Notes (Signed)
   Subjective:    Patient ID: Denise Schroeder, female    DOB: 1966-01-10, 54 y.o.   MRN: [id number]  HPI Documentation for virtual telephone encounter.  Documentation for virtual audio and video telecommunications through [place] encounter: The patient was located at home. The provider was located in the office. The patient did consent to this visit and is aware of possible charges through their insurance for this visit. The other persons participating in this telemedicine service were none. Time spent on call was 8 minutes  This virtual service is not related to other E/M service within previous 7 days. She complains of a 4-day history that started with frontal headache, sinus pressure, malaise with nasal congestion and now purulent nasal drainage as well as PND.  No fever, chills, earache.  She does have a previous history of difficulty with this.  She also has underlying allergies and is taking medications listed in the chart.     Review of Systems     Objective:   Physical Exam  Alert and in no distress with a nasal sounding voice and runny nose.      Assessment & Plan:  Acute sinusitis, recurrence not specified, unspecified location - Plan: amoxicillin-clavulanate (AUGMENTIN) 875-125 MG tablet I will go ahead and treat her as if she has a sinus infection.  She is to call when she finishes the antibiotic if not totally back to normal.

## 2019-05-23 NOTE — Telephone Encounter (Signed)
I believe that was December, so should be visit.  However, if cough/congestion, start with either allergy pill OTC or Mucinex DM.  Get covid test if having a variety of illness symptoms.  These would be my initial recommendations, but I can certainly do consult with her.

## 2019-06-12 ENCOUNTER — Ambulatory Visit: Payer: Self-pay | Attending: Internal Medicine

## 2019-06-12 DIAGNOSIS — Z23 Encounter for immunization: Secondary | ICD-10-CM

## 2019-07-05 ENCOUNTER — Ambulatory Visit: Payer: Self-pay | Attending: Internal Medicine

## 2019-07-05 DIAGNOSIS — Z23 Encounter for immunization: Secondary | ICD-10-CM

## 2019-07-05 NOTE — Progress Notes (Signed)
   Covid-19 Vaccination Clinic  Name:  Denise Schroeder    MRN: [id number] DOB: May 01, 1965  07/05/2019  [name] was observed post Covid-19 immunization for 30 minutes based on pre-vaccination screening without incident. She was provided with Vaccine Information Sheet and instruction to access the V-Safe system.   [name] was instructed to call 911 with any severe reactions post vaccine: Marland Kitchen Difficulty breathing  . Swelling of face and throat  . A fast heartbeat  . A bad rash all over body  . Dizziness and weakness   Immunizations Administered    Name Date Dose VIS Date Route   Pfizer COVID-19 Vaccine 07/05/2019  3:52 PM 0.3 mL 05/10/2018 Intramuscular   Manufacturer: [place]   Lot: [id number]   [place]: [id number]

## 2019-07-17 ENCOUNTER — Ambulatory Visit: Payer: 59 | Admitting: Medical

## 2019-07-17 ENCOUNTER — Encounter: Payer: Self-pay | Admitting: Medical

## 2019-07-17 ENCOUNTER — Other Ambulatory Visit: Payer: Self-pay

## 2019-07-17 VITALS — BP 126/82 | HR 76 | Temp 97.9°F | Ht 68.0 in | Wt 271.6 lb

## 2019-07-17 DIAGNOSIS — L209 Atopic dermatitis, unspecified: Secondary | ICD-10-CM | POA: Insufficient documentation

## 2019-07-17 DIAGNOSIS — E039 Hypothyroidism, unspecified: Secondary | ICD-10-CM

## 2019-07-17 DIAGNOSIS — E049 Nontoxic goiter, unspecified: Secondary | ICD-10-CM | POA: Diagnosis not present

## 2019-07-17 DIAGNOSIS — E669 Obesity, unspecified: Secondary | ICD-10-CM | POA: Diagnosis not present

## 2019-07-17 DIAGNOSIS — Z0000 Encounter for general adult medical examination without abnormal findings: Secondary | ICD-10-CM | POA: Diagnosis not present

## 2019-07-17 DIAGNOSIS — R21 Rash and other nonspecific skin eruption: Secondary | ICD-10-CM

## 2019-07-17 DIAGNOSIS — Z7185 Encounter for immunization safety counseling: Secondary | ICD-10-CM

## 2019-07-17 DIAGNOSIS — Z131 Encounter for screening for diabetes mellitus: Secondary | ICD-10-CM

## 2019-07-17 DIAGNOSIS — Z87892 Personal history of anaphylaxis: Secondary | ICD-10-CM

## 2019-07-17 DIAGNOSIS — Z7189 Other specified counseling: Secondary | ICD-10-CM

## 2019-07-17 MED ORDER — TRIAMCINOLONE ACETONIDE 0.1 % EX CREA: 1.0000 "application " | TOPICAL_CREAM | Freq: Two times a day (BID) | CUTANEOUS | 1 refills | Status: DC

## 2019-07-17 MED ORDER — EPINEPHRINE 0.3 MG/0.3ML IJ SOAJ: 0.3000 mg | INTRAMUSCULAR | 1 refills | Status: DC | PRN

## 2019-07-17 NOTE — Progress Notes (Signed)
Subjective: Chief Complaint  Patient presents with  . Annual Exam    with fasting labs    Here for physical.    Medical team Dr. Garwin Brothers, gynecology Sees Chiropractor, Dr. Missy Sabins, [facility] Chiropractor Dr. Erskine Emery, GI Sees dentist and eye doctor Magdalena Skilton, Camelia Eng, PA-C here for primary care  Concerns: She is excited to say she has lost weight in recent months intentionally.  Walking often, 4-6 miles at a time.  Keeping track of steps on fit bit. Was 290lb in 01/2019, and now down to 271lb.   Some weeks will do no bread, some weeks, no meat, some weeks just vegetables.    Sees chiropractor every 2 weeks.  No new c/o otherwise  Past Medical History:  Diagnosis Date  . Allergy   . Anemia   . GERD (gastroesophageal reflux disease)   . Goiter   . Hashimoto thyroiditis   . Obesity   . Uterine fibroid     Past Surgical History:  Procedure Laterality Date  . CHOLECYSTECTOMY    . COLONOSCOPY  04/2014   normal, Dr. Erskine Emery  . IR GENERIC HISTORICAL  05/31/2014   IR RADIOLOGIST EVAL & MGMT 05/31/2014 [facility] INTERV RAD  . UTERINE FIBROID EMBOLIZATION  2016   Dr. Garwin Brothers    Social History   Socioeconomic History  . Marital status: Single    Spouse name: Not on file  . Number of children: 1  . Years of education: Not on file  . Highest education level: Not on file  Occupational History  . Occupation: Civil engineer, contracting  Tobacco Use  . Smoking status: Never Smoker  . Smokeless tobacco: Never Used  Substance and Sexual Activity  . Alcohol use: Yes    Alcohol/week: 1.0 standard drinks    Types: 1 Glasses of wine per week    Comment: Occassionally  . Drug use: No  . Sexual activity: Never    Birth control/protection: None  Other Topics Concern  . Not on file  Social History Narrative   Owns daycare.   Exercise is intermittent.  Off and on relationship.  07/2019   Social Determinants of Health   Financial Resource Strain:   . Difficulty of  Paying Living Expenses:   Food Insecurity:   . Worried About Charity fundraiser in the Last Year:   . Arboriculturist in the Last Year:   Transportation Needs:   . Film/video editor (Medical):   Marland Kitchen Lack of Transportation (Non-Medical):   Physical Activity:   . Days of Exercise per Week:   . Minutes of Exercise per Session:   Stress:   . Feeling of Stress :   Social Connections:   . Frequency of Communication with Friends and Family:   . Frequency of Social Gatherings with Friends and Family:   . Attends Religious Services:   . Active Member of Clubs or Organizations:   . Attends Archivist Meetings:   Marland Kitchen Marital Status:   Intimate Partner Violence:   . Fear of Current or Ex-Partner:   . Emotionally Abused:   Marland Kitchen Physically Abused:   . Sexually Abused:     Family History  Problem Relation Age of Onset  . Diabetes Mother   . Hypertension Mother   . Other Father        died in [place] when Jhana was 54yo  . Hypertension Sister   . Other Brother        died in [place]  .  Asthma Son   . Diabetes Maternal Aunt   . Diabetes Maternal Uncle   . Hypertension Sister   . Thyroid disease Sister   . Thyroid disease Sister   . Thyroid disease Sister   . Stroke Neg Hx   . Heart disease Neg Hx   . Hyperlipidemia Neg Hx   . Colon cancer Neg Hx   . Colon polyps Neg Hx   . Kidney disease Neg Hx   . Esophageal cancer Neg Hx   . Gallbladder disease Neg Hx   . Cancer Neg Hx      Current Outpatient Medications:  .  CALCIUM PO, Take 2 tablets by mouth at bedtime. , Disp: , Rfl:  .  levothyroxine (SYNTHROID) 100 MCG tablet, take 1 tablet by mouth once daily before BREAKFAST, Disp: 90 tablet, Rfl: 3 .  Multiple Vitamin (MULTIVITAMIN WITH MINERALS) TABS, Take 1 tablet by mouth every morning. , Disp: , Rfl:  .  triamcinolone cream (KENALOG) 0.1 %, Apply 1 application topically 2 (two) times daily., Disp: 45 g, Rfl: 1 .  EPINEPHrine (EPIPEN 2-PAK) 0.3 mg/0.3 mL IJ SOAJ injection,  Inject 0.3 mLs (0.3 mg total) into the muscle as needed for anaphylaxis., Disp: 1 each, Rfl: 1 .  loratadine (ALLERGY) 10 MG tablet, Take 10 mg by mouth daily., Disp: , Rfl:  .  methocarbamol (ROBAXIN) 500 MG tablet, Take 1 tablet (500 mg total) by mouth every 8 (eight) hours as needed for muscle spasms. (Patient not taking: Reported on 05/23/2019), Disp: 8 tablet, Rfl: 0  Allergies  Allergen Reactions  . Citrus Itching, Swelling and Cough    All fruits cause this reaction  . Latex Hives      Objective: BP 126/82   Pulse 76   Temp 97.9 F (36.6 C)   Ht 5\' 8"  (1.727 m)   Wt 271 lb 9.6 oz (123.2 kg)   SpO2 98%   BMI 41.30 kg/m   General appearence: alert, no distress, WD/WN, African Amercian female Neck: supple, no lymphadenopathy, no thyromegaly, no masses, no bruits Heart: RRR, normal S1, S2, no murmurs Lungs: CTA bilaterally, no wheezes, rhonchi, or rales Abdomen: +bs, soft, non tender, non distended, no masses, no hepatomegaly, no splenomegaly Pulses: 2+ symmetric, upper and lower extremities, normal cap refill Ext: no edema Neuro: cn2-12 intact, nonfocal exam, normal UE and LE strength and sensation, 1+ DTRs throughout Mildly reduced left hip internal ROM otherwise normal, no other UE or LE deformity Breast/gyn - deferred to gynecology Skin: several small skin tags of neck circumferential     Assessment: Encounter Diagnoses  Name Primary?  . Encounter for health maintenance examination in adult Yes  . Hypothyroidism, unspecified type   . Goiter   . Obesity with serious comorbidity, unspecified classification, unspecified obesity type   . History of anaphylaxis   . Vaccine counseling   . Rash   . Screening for diabetes mellitus   . Atopic dermatitis, unspecified type     Plan: Physical exam - discussed and counseled on healthy lifestyle, diet, exercise, preventative care, vaccinations, sick and well care, proper use of emergency dept and after hours care, and  addressed their concerns.    Health screening: See your eye doctor yearly for routine vision care. See your dentist yearly for routine dental care including hygiene visits twice yearly. See your gynecologist yearly for routine gynecological care.  Hypothyroidism, goiter - consider updated thyroid ultrasound, labs today  Obesity - c/t efforts to lose weight.  Congratulated her  on her recent weight loss!  Hx/o anaphylaxis - refilled Epipen today for prn use.  Discussed use of medicaiton  Rash - likely atopic dermatitis, c/t triamcinolone prn for flare up, daily moisturizing lotion for preventative treatment   Vaccines: You are up to date on tetanus booster  You are up to date on Covid vaccine  Shingles vaccine:  I recommend you have a shingles vaccine to help prevent shingles or herpes zoster outbreak.   Please call your insurer to inquire about coverage for the Shingrix vaccine given in 2 doses.   Some insurers cover this vaccine after age 26, some cover this after age 58.  If your insurer covers this, then call to schedule appointment to have this vaccine here.  Get a yearly flu shot.    Cancer screening: We will request most recent pap and mammogram from Dr. Garwin Brothers. The last one I have on file was 2019.  I reviewed your 2016 colonoscopy which was normal.   You did stool cards 2019 which were normal.  Your next screen would be stool cards again in next year, colonoscopy 2026 or potentially the Cologuard stool test.    Heart disease screen  You had an EKG 03/2019  I reviewed your echocardiogram from 2017 which was normal    Effie was seen today for annual exam.  Diagnoses and all orders for this visit:  Encounter for health maintenance examination in adult -     Comprehensive metabolic panel -     CBC with Differential/Platelet -     Lipid panel -     Hemoglobin A1c -     TSH -     VITAMIN D 25 Hydroxy (Vit-D Deficiency, Fractures)  Hypothyroidism, unspecified  type -     TSH  Goiter -     TSH  Obesity with serious comorbidity, unspecified classification, unspecified obesity type  History of anaphylaxis  Vaccine counseling  Rash Comments: elbow  Screening for diabetes mellitus  Atopic dermatitis, unspecified type  Other orders -     EPINEPHrine (EPIPEN 2-PAK) 0.3 mg/0.3 mL IJ SOAJ injection; Inject 0.3 mLs (0.3 mg total) into the muscle as needed for anaphylaxis. -     triamcinolone cream (KENALOG) 0.1 %; Apply 1 application topically 2 (two) times daily.

## 2019-07-18 LAB — TSH: TSH: 2.17 u[IU]/mL (ref 0.450–4.500)

## 2019-07-18 LAB — COMPREHENSIVE METABOLIC PANEL
ALT: 25 IU/L (ref 0–32)
AST: 23 IU/L (ref 0–40)
Albumin/Globulin Ratio: 1.5 (ref 1.2–2.2)
Albumin: 4.4 g/dL (ref 3.8–4.9)
Alkaline Phosphatase: 89 IU/L (ref 39–117)
BUN/Creatinine Ratio: 16 (ref 9–23)
BUN: 14 mg/dL (ref 6–24)
Bilirubin Total: 0.4 mg/dL (ref 0.0–1.2)
CO2: 20 mmol/L (ref 20–29)
Calcium: 10.3 mg/dL — ABNORMAL HIGH (ref 8.7–10.2)
Chloride: 105 mmol/L (ref 96–106)
Creatinine, Ser: 0.87 mg/dL (ref 0.57–1.00)
GFR calc Af Amer: 87 mL/min/{1.73_m2} (ref >59)
GFR calc non Af Amer: 76 mL/min/{1.73_m2} (ref >59)
Globulin, Total: 3 g/dL (ref 1.5–4.5)
Glucose: 80 mg/dL (ref 65–99)
Potassium: 4.6 mmol/L (ref 3.5–5.2)
Sodium: 141 mmol/L (ref 134–144)
Total Protein: 7.4 g/dL (ref 6.0–8.5)

## 2019-07-18 LAB — CBC WITH DIFFERENTIAL/PLATELET
Basophils Absolute: 0 10*3/uL (ref 0.0–0.2)
Basos: 1 %
EOS (ABSOLUTE): 0.3 10*3/uL (ref 0.0–0.4)
Eos: 5 %
Hematocrit: 36.4 % (ref 34.0–46.6)
Hemoglobin: 12.2 g/dL (ref 11.1–15.9)
Immature Grans (Abs): 0 10*3/uL (ref 0.0–0.1)
Immature Granulocytes: 0 %
Lymphocytes Absolute: 2.5 10*3/uL (ref 0.7–3.1)
Lymphs: 40 %
MCH: 28.6 pg (ref 26.6–33.0)
MCHC: 33.5 g/dL (ref 31.5–35.7)
MCV: 85 fL (ref 79–97)
Monocytes Absolute: 0.5 10*3/uL (ref 0.1–0.9)
Monocytes: 9 %
Neutrophils Absolute: 2.9 10*3/uL (ref 1.4–7.0)
Neutrophils: 45 %
Platelets: 254 10*3/uL (ref 150–450)
RBC: 4.26 x10E6/uL (ref 3.77–5.28)
RDW: 13.6 % (ref 11.7–15.4)
WBC: 6.3 10*3/uL (ref 3.4–10.8)

## 2019-07-18 LAB — HEMOGLOBIN A1C
Est. average glucose Bld gHb Est-mCnc: 105 mg/dL
Hgb A1c MFr Bld: 5.3 % (ref 4.8–5.6)

## 2019-07-18 LAB — LIPID PANEL
Chol/HDL Ratio: 3.6 ratio (ref 0.0–4.4)
Cholesterol, Total: 232 mg/dL — ABNORMAL HIGH (ref 100–199)
HDL: 65 mg/dL (ref >39)
LDL Chol Calc (NIH): 153 mg/dL — ABNORMAL HIGH (ref 0–99)
Triglycerides: 80 mg/dL (ref 0–149)
VLDL Cholesterol Cal: 14 mg/dL (ref 5–40)

## 2019-07-18 LAB — VITAMIN D 25 HYDROXY (VIT D DEFICIENCY, FRACTURES): Vit D, 25-Hydroxy: 41.7 ng/mL (ref 30.0–100.0)

## 2019-07-19 NOTE — Progress Notes (Signed)
Call and make sure she read the MyChart message.  See if she wants to pursue updated thyroid ultrasound since has been a while since the last time

## 2019-07-27 ENCOUNTER — Ambulatory Visit: Payer: 59 | Admitting: Family Medicine

## 2019-07-27 ENCOUNTER — Other Ambulatory Visit: Payer: Self-pay

## 2019-07-27 ENCOUNTER — Encounter: Payer: Self-pay | Admitting: Family Medicine

## 2019-07-27 VITALS — BP 120/78 | HR 76 | Temp 98.0°F | Ht 69.0 in | Wt 272.8 lb

## 2019-07-27 DIAGNOSIS — Z91018 Allergy to other foods: Secondary | ICD-10-CM | POA: Diagnosis not present

## 2019-07-27 DIAGNOSIS — K146 Glossodynia: Secondary | ICD-10-CM | POA: Diagnosis not present

## 2019-07-27 NOTE — Patient Instructions (Signed)
We are testing you for nut allergies through blood tests today. Take antihistamines (ie allegra or zyrtec or claritin, plus or minus Benadryl, in addition, if needed) to help with your tongue discomfort. You can also try using Mylanta to swish around the tongue (you can also swish liquid benadryl around the mouth, without swallowing).  It doesn't sound as though this is progressing to an anaphylactic reaction, so Epi-pen isn't needed (unless you get throat swelling, shortness of breath, in which case you need to go to the emergency room after using the epi pen.).   Food Allergy  A food allergy is when your body reacts to a food in a way that is not normal. The reaction can be mild or very bad. A very bad allergic reaction is called an anaphylactic reaction (anaphylaxis). A very bad reaction is an emergency. What are the causes? Common foods that can cause a reaction are:  Milk.  Eggs.  Peanuts.  Tree nuts. These include pecans, walnuts, and cashews.  Seafood.  Wheat.  Soy. What are the signs or symptoms? Signs of a mild reaction  Stuffy nose.  Tingling in the mouth.  An itchy, red rash.  Throwing up (vomiting).  Watery poop (diarrhea). Signs of a very bad reaction  Itchy, red, swollen areas of skin (hives).  Swelling of your: ? Eyes. ? Lips. ? Face. ? Mouth. ? Tongue. ? Throat.  Trouble with: ? Breathing. ? Talking. ? Swallowing.  Noisy breathing (wheezing).  Passing out (fainting).  Having any of these feelings: ? Warmth in your face (flushed). ? Dizziness. ? Light-headedness.  Pain in your belly. Follow these instructions at home: If you are being tested for an allergy:  Avoid foods as told by your doctor (elimination diet).  Write down what you eat and drink in a notebook (food diary). Each day, write: ? What you eat and drink and when. ? What problems you have and when. If you have a very bad allergy:   Wear a bracelet or necklace that  says you have an allergy.  Carry your allergy kit (anaphylaxis kit) or an allergy shot (epinephrine injection) with you all the time. Use them as told by your doctor.  Make sure that you, your family, and your boss know: ? The signs of a very bad reaction. ? How to use your allergy kit. ? How to give an allergy shot.  If you use an allergy shot: ? Get more right away in case you have another reaction. ? Get help. You can have a life-threatening reaction after taking the medicine (rebound anaphylaxis). General instructions  Avoid the foods that you are allergic to.  Read food labels. Look for ingredients that you are allergic to.  When you are at a restaurant, tell your server that you have an allergy. Ask if your meal has an ingredient that you are allergic to.  Take medicines only as told by your doctor. Do not drive until the medicine has worn off, unless your doctor says it is okay.  Tell all people who care for you that you have a food allergy. This includes your doctor and dentist.  If you think that you might be allergic to something else, talk with your doctor. Do not eat a food to see if you are allergic to it without talking with your doctor first. Contact a doctor if you:  Have signs of a reaction that have not gone away after 2 days.  Get worse.  Have new signs  of a reaction. Get help right away if you have signs of a very bad reaction:  Itchy, red, swollen areas of skin.  Swelling of your: ? Eyes. ? Lips. ? Face. ? Mouth. ? Tongue. ? Throat.  Trouble with: ? Breathing. ? Talking. ? Swallowing.  Noisy breathing (wheezing).  Passing out (fainting).  Having any of these feelings: ? Warmth in your face (flushed). ? Dizziness. ? Light-headedness. ? Pain in your belly. These signs may be an emergency. Do not wait to see if the signs will go away. Use your allergy shot or allergy kit as you have been told. Get medical help right away. Call your local  emergency services (911 in the [place] Do not drive yourself to the hospital. If you had to use your allergy pen, you must go to the emergency room even if the medicine seems to be working. This is important because another allergic reaction may happen within 3 days. Summary  A food allergy is when your body reacts to a food in a way that is not normal.  Avoid the foods that you are allergic to.  Wear a bracelet or necklace that says you have an allergy.  Carry your allergy kit (anaphylaxis kit) or an allergy shot (epinephrine injection) with you all the time. Use them as told by your doctor. This information is not intended to replace advice given to you by your health care provider. Make sure you discuss any questions you have with your health care provider. Document Revised: 03/02/2017 Document Reviewed: 03/02/2017 Elsevier Patient Education  [place].

## 2019-07-27 NOTE — Progress Notes (Signed)
Chief Complaint  Patient presents with  . Allergic Reaction    had a coughing fit, tongue is burning-some SOB. Did have Nutty Bar and [place] yesterday. Normally has this reaction form citrus but not from nut products.    She ate honey nut cheerios last night.  10-15 minutes later she started coughing.  This is how her allergic reactions start.  She drank water.  She then got very fatigued, like with other allergic reactions. Tongue started burning, mouth was sensitive, hurt to talk. She still has some residual burning on the tongue.  Denies any throat swelling or shortness of breath. Didn't think to take any antihistamine.  She has fexofenadine at home (not loratidine as stated on med list), only uses it prn.  Did NOT take it recently.  She recalls she had some mixed nuts earlier in the day, and had a peanut butter chocolate bar.  She is interested in testing for nut allergies.  PMH, PSH, SH reviewed  Outpatient Encounter Medications as of 07/27/2019  Medication Sig  . acetaminophen (TYLENOL) 500 MG tablet Take 1,000 mg by mouth every 6 (six) hours as needed.  Marland Kitchen CALCIUM PO Take 2 tablets by mouth at bedtime.   Marland Kitchen levothyroxine (SYNTHROID) 100 MCG tablet take 1 tablet by mouth once daily before BREAKFAST  . Multiple Vitamin (MULTIVITAMIN WITH MINERALS) TABS Take 1 tablet by mouth every morning.   . triamcinolone cream (KENALOG) 0.1 % Apply 1 application topically 2 (two) times daily.  Marland Kitchen EPINEPHrine (EPIPEN 2-PAK) 0.3 mg/0.3 mL IJ SOAJ injection Inject 0.3 mLs (0.3 mg total) into the muscle as needed for anaphylaxis. (Patient not taking: Reported on 07/27/2019)  . loratadine (ALLERGY) 10 MG tablet Take 10 mg by mouth daily.  . methocarbamol (ROBAXIN) 500 MG tablet Take 1 tablet (500 mg total) by mouth every 8 (eight) hours as needed for muscle spasms. (Patient not taking: Reported on 05/23/2019)   No facility-administered encounter medications on file as of 07/27/2019.    Allergies   Allergen Reactions  . Citrus Itching, Swelling and Cough    All fruits cause this reaction  . Latex Hives   ROS: Tongue pain per HPI.  No fever or chills, headaches, dizziness.  Some SOB just initially, and resolved with deep breaths. No nausea, vomiting, diarrhea. Decreased appetite. No rashes, hives. Had some itching on her palms, which resolved (intermittent).   PHYSICAL EXAM:  BP 120/78   Pulse 76   Temp 98 F (36.7 C) (Tympanic)   Ht 5\' 9"  (1.753 m)   Wt 272 lb 12.8 oz (123.7 kg)   BMI 40.29 kg/m   Wt Readings from Last 3 Encounters:  07/27/19 272 lb 12.8 oz (123.7 kg)  07/17/19 271 lb 9.6 oz (123.2 kg)  05/23/19 278 lb (126.1 kg)   Pleasant, well-appearing female in no distress HEENT: conjunctiva and sclera are clear, EOMI.  OP is clear.  No abnormality of tongue or mucus membranes. Neck: no lymphadenopathy or mass Heart: regular rate and rhythm Lungs: clear bilaterally Skin: normal turgor, no rashes Neuro: alert and oriented, normal gait, cranial nerves   ASSESSMENT/PLAN:  Tongue pain - normal exam.  c/w sx with other food allergies, concern for possible nut allergy. Supportive measures and antihistamines rec/disc - Plan: Allergy Panel 18, Nut Mix Group  Multiple food allergies - Plan: Allergy Panel 18, Nut Mix Group

## 2019-07-31 LAB — ALLERGY PANEL 18, NUT MIX GROUP
Allergen Coconut IgE: <0.1 kU/L
F020-IgE Almond: <0.1 kU/L
F202-IgE Cashew Nut: 0.23 kU/L — AB
Hazelnut (Filbert) IgE: <0.1 kU/L
Peanut IgE: <0.1 kU/L
Pecan Nut IgE: <0.1 kU/L
Sesame Seed IgE: 0.23 kU/L — AB

## 2019-09-01 LAB — HM MAMMOGRAPHY

## 2019-09-08 ENCOUNTER — Encounter: Payer: Self-pay | Admitting: Medical

## 2019-11-13 ENCOUNTER — Other Ambulatory Visit: Payer: Self-pay | Admitting: Medical

## 2019-11-15 ENCOUNTER — Telehealth: Payer: 59 | Admitting: Medical

## 2019-11-23 ENCOUNTER — Other Ambulatory Visit (INDEPENDENT_AMBULATORY_CARE_PROVIDER_SITE_OTHER): Payer: 59

## 2019-11-23 ENCOUNTER — Other Ambulatory Visit: Payer: Self-pay

## 2019-11-23 ENCOUNTER — Telehealth: Payer: 59 | Admitting: Family Medicine

## 2019-11-23 ENCOUNTER — Encounter: Payer: Self-pay | Admitting: Family Medicine

## 2019-11-23 VITALS — Wt 272.0 lb

## 2019-11-23 DIAGNOSIS — R0981 Nasal congestion: Secondary | ICD-10-CM

## 2019-11-23 DIAGNOSIS — J3489 Other specified disorders of nose and nasal sinuses: Secondary | ICD-10-CM | POA: Diagnosis not present

## 2019-11-23 DIAGNOSIS — J0140 Acute pansinusitis, unspecified: Secondary | ICD-10-CM | POA: Diagnosis not present

## 2019-11-23 LAB — POCT INFLUENZA A/B
Influenza A, POC: NEGATIVE
Influenza B, POC: NEGATIVE

## 2019-11-23 LAB — POC COVID19 BINAXNOW: SARS Coronavirus 2 Ag: NEGATIVE

## 2019-11-23 MED ORDER — AMOXICILLIN 875 MG PO TABS: 875.0000 mg | ORAL_TABLET | Freq: Two times a day (BID) | ORAL | 0 refills | Status: DC

## 2019-11-23 NOTE — Progress Notes (Signed)
° °  Subjective:  Documentation for virtual audio and video telecommunications through [place] encounter: She will also come to the office for a Covid test.   The patient was located at home. 2 patient identifiers used.  The provider was located in the office. The patient did consent to this visit and is aware of possible charges through their insurance for this visit.  The other persons participating in this telemedicine service were none. Time spent on call was 12 minutes and in review of previous records 15 minutes total.  This virtual service is not related to other E/M service within previous 7 days.   Patient ID: Denise Schroeder, female    DOB: 1966/03/03, 54 y.o.   MRN: [id number]  HPI Chief Complaint  Patient presents with   sinus infection    sinus infection x 2 weeks, runny nose from mucinex, cough with drainage   Complains of a 6 day hx of sinus pressure, nasal congestion, rhinorrhea, post nasal drainage. Coughing up brown mucus in the morning but otherwise no cough.  She thinks she has a sinus infection.   No fever, chills, headache, sore throat, chest pain, shortness of breath.  No loss of smell or taste.  She is fully vaccinated against Covid.   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   Has a flight scheduled for tomorrow.   Reports taking tylenol sinus and Mucinex sinus max.  Taking Afrin nose spray for the past week.  Allegra   Reviewed allergies, medications, past medical, surgical, family, and social history.    Review of Systems Pertinent positives and negatives in the history of present illness.     Objective:   Physical Exam Wt 272 lb (123.4 kg)    BMI 40.17 kg/m   Alert and oriented and in no acute distress.  Respirations unlabored, speaking in complete sentences without difficulty.  Normal speech, mood and thought process.      Assessment & Plan:  Acute pansinusitis, recurrence not specified - Plan: amoxicillin (AMOXIL) 875 MG tablet  Nasal  congestion  Sinus pain  Rhinorrhea  Negative rapid Covid test. Discussed symptomatic treatment and I will also prescribe amoxicillin for sinusitis.  Discussed stopping Afrin after 3 days due to rebound congestion.  Follow-up if not back to baseline or if worsening.

## 2019-11-25 LAB — SARS-COV-2, NAA 2 DAY TAT

## 2019-11-25 LAB — NOVEL CORONAVIRUS, NAA: SARS-CoV-2, NAA: NOT DETECTED

## 2019-11-27 ENCOUNTER — Telehealth: Payer: Self-pay | Admitting: Family Medicine

## 2019-11-27 MED ORDER — FLUCONAZOLE 150 MG PO TABS: 150.0000 mg | ORAL_TABLET | Freq: Once | ORAL | 1 refills | Status: AC

## 2019-11-27 NOTE — Telephone Encounter (Signed)
Pt called, she does need diflucan pill called in  She has a couple more antibiotic pills to take, when should she take Diflucan or does she need more than 1pill  Yeast inf not bad yet but getting worse   Engineer, materials

## 2019-11-27 NOTE — Telephone Encounter (Signed)
Pt was notified.  

## 2019-11-27 NOTE — Telephone Encounter (Signed)
Ok to send in diflucan 150 mg x 1 dose with 1 refill. Go ahead and take the first one and then one week later may take the 2nd one if needed.

## 2019-12-07 ENCOUNTER — Other Ambulatory Visit: Payer: Self-pay

## 2019-12-07 ENCOUNTER — Telehealth: Payer: 59 | Admitting: Family Medicine

## 2019-12-07 ENCOUNTER — Encounter: Payer: Self-pay | Admitting: Family Medicine

## 2019-12-07 VITALS — Temp 97.3°F | Wt 270.0 lb

## 2019-12-07 DIAGNOSIS — R05 Cough: Secondary | ICD-10-CM | POA: Diagnosis not present

## 2019-12-07 DIAGNOSIS — R0981 Nasal congestion: Secondary | ICD-10-CM

## 2019-12-07 DIAGNOSIS — R059 Cough, unspecified: Secondary | ICD-10-CM

## 2019-12-07 DIAGNOSIS — J45909 Unspecified asthma, uncomplicated: Secondary | ICD-10-CM

## 2019-12-07 MED ORDER — ALBUTEROL SULFATE HFA 108 (90 BASE) MCG/ACT IN AERS: 2.0000 | INHALATION_SPRAY | Freq: Four times a day (QID) | RESPIRATORY_TRACT | 0 refills | Status: DC | PRN

## 2019-12-07 MED ORDER — ASMANEX (60 METERED DOSES) 220 MCG/INH IN AEPB: 2.0000 | INHALATION_SPRAY | Freq: Every day | RESPIRATORY_TRACT | 1 refills | Status: DC

## 2019-12-07 NOTE — Progress Notes (Signed)
° °  Subjective:  Documentation for virtual audio and video telecommunications through [place] encounter:  The patient was located at work. 2 patient identifiers used.  The provider was located in the office. The patient did consent to this visit and is aware of possible charges through their insurance for this visit.  The other persons participating in this telemedicine service were none. Time spent on call was 14 minutes and in review of previous records 20 minutes total.  This virtual service is not related to other E/M service within previous 7 days.   Patient ID: Denise Schroeder, female    DOB: May 21, 1965, 54 y.o.   MRN: [id number]  HPI Chief Complaint  Patient presents with   still sick    still sick- congestion in nose had antbiotic 2 weeks ago, cough, taking over the counter meds as recommended. having to use inhaler and almost out.    I saw her on 11/23/2019 for sinusitis. States her symptoms improved after completing Amoxil. Thick, green mucus cleared up.States while she was in [place], her allergy symptoms cleared up but when she returned here she became symptomatic again.   Now she is having nasal congestion again with coughing, chest tightness and wheezing.  Denies history of asthma but she has an albuterol inhaler and has been using it often, 3-6 times per week.  She has also been taking Allegra and Robitussin and a sinus medication with a decongestant.   Denies fever, chills, dizziness, chest pain, palpitations, abdominal pain, N/V/D, urinary symptoms, LE edema.   Reviewed allergies, medications, past medical, surgical, family, and social history.   Review of Systems Pertinent positives and negatives in the history of present illness.     Objective:   Physical Exam Temp (!) 97.3 F (36.3 C)    Wt 270 lb (122.5 kg)    BMI 39.87 kg/m   Alert and oriented and in no acute distress.  Respirations unlabored.  She is able to speak in complete sentences without  difficulty.      Assessment & Plan:  Asthma due to environmental allergies - Plan: albuterol (VENTOLIN HFA) 108 (90 Base) MCG/ACT inhaler, mometasone (ASMANEX, 60 METERED DOSES,) 220 MCG/INH inhaler  Nasal congestion  Cough  Sinus symptoms improved with amoxicillin.  History of allergies and suspect she may now have asthma.  She has been using albuterol inhaler several times each week due to coughing and shortness of breath.  I recommend she continue with allergy medication, decongestant and hydration.  I will prescribe a steroid inhaler for her to try for the next couple of weeks.  Discussed rinsing her mouth out after using this.  I will also refill her albuterol inhaler per her request.  Encouraged her to follow-up with her PCP, Dorothea Ogle in the next few weeks regarding her allergy and asthma symptoms.

## 2019-12-19 ENCOUNTER — Ambulatory Visit
Admission: RE | Admit: 2019-12-19 | Discharge: 2019-12-19 | Disposition: A | Discharge status: Home / Self Care | Payer: Self-pay | Source: Ambulatory Visit | Attending: Medical | Admitting: Medical

## 2019-12-19 ENCOUNTER — Encounter: Payer: Self-pay | Admitting: Medical

## 2019-12-19 ENCOUNTER — Ambulatory Visit: Payer: 59 | Admitting: Medical

## 2019-12-19 ENCOUNTER — Other Ambulatory Visit: Payer: Self-pay

## 2019-12-19 VITALS — BP 128/88 | HR 70 | Ht 69.0 in | Wt 271.0 lb

## 2019-12-19 DIAGNOSIS — R040 Epistaxis: Secondary | ICD-10-CM | POA: Insufficient documentation

## 2019-12-19 DIAGNOSIS — R0600 Dyspnea, unspecified: Secondary | ICD-10-CM | POA: Diagnosis not present

## 2019-12-19 DIAGNOSIS — R430 Anosmia: Secondary | ICD-10-CM | POA: Diagnosis not present

## 2019-12-19 DIAGNOSIS — Z23 Encounter for immunization: Secondary | ICD-10-CM | POA: Diagnosis not present

## 2019-12-19 MED ORDER — BREZTRI AEROSPHERE 160-9-4.8 MCG/ACT IN AERO: 2.0000 | INHALATION_SPRAY | Freq: Two times a day (BID) | RESPIRATORY_TRACT | 0 refills | Status: DC

## 2019-12-19 NOTE — Progress Notes (Signed)
Established patient visit   Patient: Denise Schroeder   DOB: 1965/04/16   54 y.o. Female  MRN: [id number] Visit Date: 12/19/2019  Today's healthcare provider: Dorothea Ogle, PA-C   Chief Complaint  Patient presents with  . Follow-up    wants flu shot   I,Shane Tysinger,acting as a scribe for [employer], PA-C.,have documented all relevant documentation on the behalf of Dorothea Ogle, PA-C,as directed by  [employer], PA-C while in the presence of [employer], PA-C.  Subjective    HPI Follow-up  Patient presents today for follow-up due to coughing, difficulty breathing, and no smell for 2 months. She reports having covid testing on 11/23/2019 that was negative.. Patient reports that she have taking Mucinex, ventolin inhaler, Loratadine, nasal spray and it help out some. She reports right side nasal bleeding on 12/15/19.     Medications: Outpatient Medications Prior to Visit  Medication Sig  . acetaminophen (TYLENOL) 500 MG tablet Take 1,000 mg by mouth every 6 (six) hours as needed.  Marland Kitchen albuterol (VENTOLIN HFA) 108 (90 Base) MCG/ACT inhaler Inhale 2 puffs into the lungs every 6 (six) hours as needed for wheezing or shortness of breath.  Marland Kitchen CALCIUM PO Take 2 tablets by mouth at bedtime.   Marland Kitchen EPINEPHrine (EPIPEN 2-PAK) 0.3 mg/0.3 mL IJ SOAJ injection Inject 0.3 mLs (0.3 mg total) into the muscle as needed for anaphylaxis.  Marland Kitchen levothyroxine (SYNTHROID) 100 MCG tablet TAKE 1 TABLET BY MOUTH EVERY DAY BEFORE BREAKFAST  . loratadine (ALLERGY) 10 MG tablet Take 10 mg by mouth daily.  . Multiple Vitamin (MULTIVITAMIN WITH MINERALS) TABS Take 1 tablet by mouth every morning.   . triamcinolone cream (KENALOG) 0.1 % Apply 1 application topically 2 (two) times daily.  . [DISCONTINUED] mometasone (ASMANEX, 60 METERED DOSES,) 220 MCG/INH inhaler Inhale 2 puffs into the lungs daily.   No facility-administered medications prior to visit.    Review of Systems Review of systems as in  subjective   Objective    BP 128/88   Pulse 70   Ht 5\' 9"  (1.753 m)   Wt 271 lb (122.9 kg)   SpO2 96%   BMI 40.02 kg/m   General appearence: alert, no distress, WD/WN,  HEENT: normocephalic, sclerae anicteric, TMs pearly, nares patent, but there is turbinate edema, slight inflamed appearing of right nostril but no obvious bleeding or dried blood, no discharge or erythema, pharynx normal Oral cavity: MMM, no lesions Neck: supple, no lymphadenopathy, no thyromegaly, no masses Heart: RRR, normal S1, S2, no murmurs Lungs: Decreased throughout but no wheezes, rhonchi, or rales Pulses: 2+ symmetric, upper and lower extremities, normal cap refill Extremities: No edema     Assessment & Plan   Encounter Diagnoses  Name Primary?  . Loss of smell Yes  . Dyspnea, unspecified type   . Need for influenza vaccination   . Nosebleed        We discussed her symptoms and the likelihood that she could have had Covid despite a negative Covid test.  She initially had symptoms in August that included loss of smell, cough, congestion.  We will do a Covid antibody test and chest x-ray  For now continue Xyzal at night, rest, hydration, albuterol rescue inhaler and add Brezstri preventive inhaler as she does see an improvement on the albuterol.  I gave her a sample of the [place].  She was advised that she can try using triple antibiotic ointment or Vaseline to prevent nasal dryness short-term.  Counseled  on the influenza virus vaccine.  Vaccine information sheet given.  Influenza vaccine given after consent obtained.   Seraphina was seen today for follow-up.  Diagnoses and all orders for this visit:  Loss of smell -     SAR CoV2 Serology (COVID 19)AB(IGG)IA  Dyspnea, unspecified type -     DG Chest 2 View; Future -     SAR CoV2 Serology (COVID 19)AB(IGG)IA  Need for influenza vaccination  Nosebleed  Other orders -     Budeson-Glycopyrrol-Formoterol (BREZTRI AEROSPHERE) 160-9-4.8  MCG/ACT AERO; Inhale 2 puffs into the lungs 2 (two) times daily. -     Flu Vaccine QUAD 6+ mos PF IM (Fluarix Quad PF)    Dorothea Ogle, PA-C  [facility] [phone] (phone) [phone] (fax)  [place]

## 2019-12-20 LAB — SAR COV2 SEROLOGY (COVID19)AB(IGG),IA: DiaSorin SARS-CoV-2 Ab, IgG: POSITIVE

## 2020-01-02 ENCOUNTER — Other Ambulatory Visit: Payer: Self-pay | Admitting: Family Medicine

## 2020-01-02 DIAGNOSIS — J45909 Unspecified asthma, uncomplicated: Secondary | ICD-10-CM

## 2020-03-06 ENCOUNTER — Ambulatory Visit (INDEPENDENT_AMBULATORY_CARE_PROVIDER_SITE_OTHER): Payer: 59

## 2020-03-06 ENCOUNTER — Other Ambulatory Visit: Payer: Self-pay

## 2020-03-06 DIAGNOSIS — Z23 Encounter for immunization: Secondary | ICD-10-CM

## 2020-05-17 ENCOUNTER — Other Ambulatory Visit: Payer: Self-pay | Admitting: Medical

## 2020-05-22 ENCOUNTER — Telehealth: Payer: Self-pay | Admitting: Medical

## 2020-05-22 NOTE — Telephone Encounter (Signed)
Pt called and said she is having a lot of trouble breathing this morning and wants to see what you recommend, she said she used her inhaler this morning but its not doing much. She is wondering if she needs to take a breathing treatment or something else

## 2020-05-22 NOTE — Telephone Encounter (Signed)
Is hard to say not knowing what is going on.  If she is referring to her asthma, is she still using  Breztri prevention inhaler?  Or if she is out of this?  Does she have albuterol inhaler and using this every 4-6 hours?  Does she have a nebulizer machine at home to use liquid albuterol?  A lot of times as spring approaches asthma flares up.  If she thinks this is an issue then begin taking daily allergy pill like Zyrtec or Allegra  If not related to any of this , then she should have a visit for breathing issues

## 2020-05-22 NOTE — Telephone Encounter (Signed)
Patient takes zyrtec. Inhaler helps but she has to use more than 1 pump. Creztri. Patient has appt for Friday. Does not have nebulizer nor solution.

## 2020-05-23 ENCOUNTER — Other Ambulatory Visit: Payer: Self-pay | Admitting: Medical

## 2020-05-23 DIAGNOSIS — J45909 Unspecified asthma, uncomplicated: Secondary | ICD-10-CM

## 2020-05-23 MED ORDER — ALBUTEROL SULFATE HFA 108 (90 BASE) MCG/ACT IN AERS: 2.0000 | INHALATION_SPRAY | Freq: Four times a day (QID) | RESPIRATORY_TRACT | 0 refills | Status: DC | PRN

## 2020-05-23 NOTE — Telephone Encounter (Signed)
I sent refill on Albuterol inhaler/rescue inhaler as well.

## 2020-05-24 ENCOUNTER — Ambulatory Visit (INDEPENDENT_AMBULATORY_CARE_PROVIDER_SITE_OTHER): Payer: 59 | Admitting: Medical

## 2020-05-24 ENCOUNTER — Other Ambulatory Visit: Payer: Self-pay

## 2020-05-24 ENCOUNTER — Encounter: Payer: Self-pay | Admitting: Medical

## 2020-05-24 VITALS — BP 116/66 | HR 69 | Ht 69.0 in | Wt 271.8 lb

## 2020-05-24 DIAGNOSIS — R0600 Dyspnea, unspecified: Secondary | ICD-10-CM | POA: Diagnosis not present

## 2020-05-24 DIAGNOSIS — R062 Wheezing: Secondary | ICD-10-CM

## 2020-05-24 DIAGNOSIS — E039 Hypothyroidism, unspecified: Secondary | ICD-10-CM

## 2020-05-24 DIAGNOSIS — J45909 Unspecified asthma, uncomplicated: Secondary | ICD-10-CM

## 2020-05-24 MED ORDER — BREO ELLIPTA 200-25 MCG/INH IN AEPB: 1.0000 | INHALATION_SPRAY | Freq: Every day | RESPIRATORY_TRACT | 0 refills | Status: DC

## 2020-05-24 NOTE — Progress Notes (Signed)
Subjective:  Denise Schroeder is a 55 y.o. female who presents for Chief Complaint  Patient presents with  . Breathing Problem     Here for breathing concern.  On and off having some dyspnea.  But worse this past 2 weeks.  Overnight breathing is worse.   In the past hasn't had a lot of spring allergy problems.  But has had breathing issues in the past year or 2.    Using albuterol but it didn't smell strong enough.  Nonsmoker.   Hearing some wheezing, having some cough fits.   No fever, no sick, no body aches, no chills, no runny nose or sneering.  No chest pain or palpitations.  No edema. No calve pain.   Did travel to [place] in late February by plane to see Denise Schroeder concern.    She alternates xyzal and allergra monthly  no other aggravating or relieving factors.    No other c/o.  Past Medical History:  Diagnosis Date  . Allergy   . Anemia   . GERD (gastroesophageal reflux disease)   . Goiter   . Hashimoto thyroiditis   . Obesity   . Uterine fibroid    Current Outpatient Medications on File Prior to Visit  Medication Sig Dispense Refill  . albuterol (VENTOLIN HFA) 108 (90 Base) MCG/ACT inhaler Inhale 2 puffs into the lungs every 6 (six) hours as needed for wheezing or shortness of breath. 6.7 g 0  . CALCIUM PO Take 2 tablets by mouth at bedtime.     Marland Kitchen levothyroxine (SYNTHROID) 100 MCG tablet TAKE 1 TABLET BY MOUTH EVERY DAY BEFORE BREAKFAST 90 tablet 0  . Multiple Vitamin (MULTIVITAMIN WITH MINERALS) TABS Take 1 tablet by mouth every morning.     . triamcinolone cream (KENALOG) 0.1 % Apply 1 application topically 2 (two) times daily. 45 g 1  . acetaminophen (TYLENOL) 500 MG tablet Take 1,000 mg by mouth every 6 (six) hours as needed. (Patient not taking: Reported on 05/24/2020)    . EPINEPHrine (EPIPEN 2-PAK) 0.3 mg/0.3 mL IJ SOAJ injection Inject 0.3 mLs (0.3 mg total) into the muscle as needed for anaphylaxis. (Patient not taking: Reported on 05/24/2020) 1 each 1  . loratadine  (CLARITIN) 10 MG tablet Take 10 mg by mouth daily. (Patient not taking: Reported on 05/24/2020)     No current facility-administered medications on file prior to visit.     The following portions of the patient's history were reviewed and updated as appropriate: allergies, current medications, past family history, past medical history, past social history, past surgical history and problem list.  ROS Otherwise as in subjective above    Objective: BP 116/66   Pulse 69   Ht 5\' 9"  (1.753 m)   Wt 271 lb 12.8 oz (123.3 kg)   SpO2 94%   BMI 40.14 kg/m   Wt Readings from Last 3 Encounters:  05/24/20 271 lb 12.8 oz (123.3 kg)  12/19/19 271 lb (122.9 kg)  12/07/19 270 lb (122.5 kg)    General appearance: alert, no distress, well developed, well nourished HEENT: normocephalic, sclerae anicteric, conjunctiva pink and moist, TMs pearly, nares patent, no discharge or erythema, pharynx normal Oral cavity: MMM, no lesions Neck: supple, no lymphadenopathy, no thyromegaly, no masses, no JVD Heart: RRR, normal S1, S2, no murmurs Lungs: decreased lower fields bilat, otherwise no wheezes, rhonchi, or rales Pulses: 2+ radial pulses, 2+ pedal pulses, normal cap refill Ext: no edema No calve tendnerss, no calve swelling, neg homans  Assessment: Encounter Diagnoses  Name Primary?  Marland Kitchen Dyspnea, unspecified type Yes  . Hypothyroidism, unspecified type   . Wheezing   . Extrinsic asthma without complication, unspecified asthma severity, unspecified whether persistent      Plan: PFT abnormal.  Begin trial of Breo sample daily, c/t albuterol prn.   symptoms and exam suggest adult onset asthma within past year.   Advised she c/t daily antihistamine, suspect allergic asthma.   If not improving in the next 2 weeks, consider pulmonology consult.    Reviewed 03/2019 EKG, 12/2019 chest xray, labs from last year's physical  Discussed differential - I doubt cardiac source of dyspnea, no obvious  concern for DVT/PE. PFT shows obstruction but possible restriction pattern.    Call report 2 weeks  Denise Schroeder was seen today for breathing problem.  Diagnoses and all orders for this visit:  Dyspnea, unspecified type  Hypothyroidism, unspecified type  Wheezing -     Spirometry with graph  Extrinsic asthma without complication, unspecified asthma severity, unspecified whether persistent  Other orders -     fluticasone furoate-vilanterol (BREO ELLIPTA) 200-25 MCG/INH AEPB; Inhale 1 puff into the lungs daily.    Follow up: 2 weeks

## 2020-07-17 ENCOUNTER — Encounter: Payer: Self-pay | Admitting: Medical

## 2020-07-17 ENCOUNTER — Other Ambulatory Visit: Payer: Self-pay

## 2020-07-17 ENCOUNTER — Ambulatory Visit (INDEPENDENT_AMBULATORY_CARE_PROVIDER_SITE_OTHER): Payer: 59 | Admitting: Medical

## 2020-07-17 VITALS — BP 128/80 | HR 68 | Temp 99.5°F | Ht 68.5 in | Wt 272.2 lb

## 2020-07-17 DIAGNOSIS — D259 Leiomyoma of uterus, unspecified: Secondary | ICD-10-CM

## 2020-07-17 DIAGNOSIS — J45909 Unspecified asthma, uncomplicated: Secondary | ICD-10-CM

## 2020-07-17 DIAGNOSIS — L209 Atopic dermatitis, unspecified: Secondary | ICD-10-CM

## 2020-07-17 DIAGNOSIS — Z7185 Encounter for immunization safety counseling: Secondary | ICD-10-CM

## 2020-07-17 DIAGNOSIS — Z23 Encounter for immunization: Secondary | ICD-10-CM

## 2020-07-17 DIAGNOSIS — E039 Hypothyroidism, unspecified: Secondary | ICD-10-CM

## 2020-07-17 DIAGNOSIS — E049 Nontoxic goiter, unspecified: Secondary | ICD-10-CM

## 2020-07-17 DIAGNOSIS — E669 Obesity, unspecified: Secondary | ICD-10-CM

## 2020-07-17 DIAGNOSIS — Z0000 Encounter for general adult medical examination without abnormal findings: Secondary | ICD-10-CM

## 2020-07-17 DIAGNOSIS — Z131 Encounter for screening for diabetes mellitus: Secondary | ICD-10-CM

## 2020-07-17 DIAGNOSIS — Z91018 Allergy to other foods: Secondary | ICD-10-CM

## 2020-07-17 DIAGNOSIS — Z87892 Personal history of anaphylaxis: Secondary | ICD-10-CM

## 2020-07-17 DIAGNOSIS — Z136 Encounter for screening for cardiovascular disorders: Secondary | ICD-10-CM | POA: Insufficient documentation

## 2020-07-17 LAB — POCT URINALYSIS DIP (PROADVANTAGE DEVICE)
Bilirubin, UA: NEGATIVE
Blood, UA: NEGATIVE
Glucose, UA: NEGATIVE mg/dL
Ketones, POC UA: NEGATIVE mg/dL
Leukocytes, UA: NEGATIVE
Nitrite, UA: NEGATIVE
Protein Ur, POC: NEGATIVE mg/dL
Specific Gravity, Urine: 1.025
Urobilinogen, Ur: 0.2
pH, UA: 6 (ref 5.0–8.0)

## 2020-07-17 NOTE — Progress Notes (Signed)
Subjective: Chief Complaint  Patient presents with  . Annual Exam    CPE lt. Side of back hurting maybe gas see'e eye doctor and dentist yearly    Here for physical.    Medical team Dr. Garwin Brothers, gynecology Sees Chiropractor, [facility] Chiropractor Dr. Erskine Emery, GI Sees dentist and eye doctor Xaviar Lunn, Camelia Eng, PA-C here for primary care  Concerns: Hypothyroidism - compliant with thyroid medication  Asthma - no recent issues since march.   Sometimes has to use albuterol more frequently of late with pollen and allergies  Has lost weight through exercise and working with trainer  Last week has some thick yellow green mucous, head congestion., some nasal congestion.   No other aggravating or relieving factors. No other complaint.   No new c/o otherwise  Past Medical History:  Diagnosis Date  . Allergy   . Anemia   . GERD (gastroesophageal reflux disease)   . Goiter   . Hashimoto thyroiditis   . Obesity   . Uterine fibroid     Past Surgical History:  Procedure Laterality Date  . CHOLECYSTECTOMY    . COLONOSCOPY  04/2014   normal, Dr. Erskine Emery  . IR GENERIC HISTORICAL  05/31/2014   IR RADIOLOGIST EVAL & MGMT 05/31/2014 [facility] INTERV RAD  . UTERINE FIBROID EMBOLIZATION  2016   Dr. Garwin Brothers      Family History  Problem Relation Age of Onset  . Diabetes Mother   . Hypertension Mother   . Cancer Mother 37       breast  . Other Father        died in [place] when Coram was 55yo  . Hypertension Sister   . Other Brother        died in [place]  . Asthma Son   . Diabetes Maternal Aunt   . Diabetes Maternal Uncle   . Hypertension Sister   . Thyroid disease Sister   . Thyroid disease Sister   . Thyroid disease Sister   . Stroke Neg Hx   . Heart disease Neg Hx   . Hyperlipidemia Neg Hx   . Colon cancer Neg Hx   . Colon polyps Neg Hx   . Kidney disease Neg Hx   . Esophageal cancer Neg Hx   . Gallbladder disease Neg Hx      Current Outpatient Medications:   .  CALCIUM PO, Take 2 tablets by mouth at bedtime. , Disp: , Rfl:  .  Multiple Vitamin (MULTIVITAMIN WITH MINERALS) TABS, Take 1 tablet by mouth every morning. , Disp: , Rfl:  .  triamcinolone cream (KENALOG) 0.1 %, Apply 1 application topically 2 (two) times daily., Disp: 45 g, Rfl: 1 .  albuterol (VENTOLIN HFA) 108 (90 Base) MCG/ACT inhaler, Inhale 2 puffs into the lungs every 6 (six) hours as needed for wheezing or shortness of breath., Disp: 18 g, Rfl: 1 .  EPINEPHrine (EPIPEN 2-PAK) 0.3 mg/0.3 mL IJ SOAJ injection, Inject 0.3 mg into the muscle as needed for anaphylaxis., Disp: 1 each, Rfl: 1 .  fluticasone furoate-vilanterol (BREO ELLIPTA) 200-25 MCG/INH AEPB, Inhale 1 puff into the lungs daily., Disp: 28 each, Rfl: 6 .  levocetirizine (XYZAL) 5 MG tablet, Take 1 tablet (5 mg total) by mouth every evening., Disp: 90 tablet, Rfl: 3 .  levothyroxine (SYNTHROID) 100 MCG tablet, Take 1 tablet (100 mcg total) by mouth daily before breakfast., Disp: 90 tablet, Rfl: 3  Allergies  Allergen Reactions  . Citrus Itching, Swelling and Cough  All fruits cause this reaction  . Cashew Nut (Anacardium Occidentale) Skin Test   . Latex Hives  . Sesame Seed (Diagnostic)       Objective: BP 128/80   Pulse 68   Temp 99.5 F (37.5 C)   Ht 5' 8.5" (1.74 m)   Wt 272 lb 3.2 oz (123.5 kg)   BMI 40.79 kg/m    Wt Readings from Last 3 Encounters:  07/17/20 272 lb 3.2 oz (123.5 kg)  05/24/20 271 lb 12.8 oz (123.3 kg)  12/19/19 271 lb (122.9 kg)   General appearence: alert, no distress, WD/WN, African American female HENT - turbinated edema pink, otherwise normal HENT Neck: supple, no lymphadenopathy, no thyromegaly, no masses, no bruits Heart: RRR, normal S1, S2, no murmurs Lungs: CTA bilaterally, no wheezes, rhonchi, or rales Abdomen: +bs, soft, non tender, non distended, no masses, no hepatomegaly, no splenomegaly Pulses: 2+ symmetric, upper and lower extremities, normal cap refill Ext: no  edema Neuro: cn2-12 intact, nonfocal exam, normal UE and LE strength and sensation, 1+ DTRs throughout Mildly reduced left hip internal ROM otherwise normal, no other UE or LE deformity Breast/gyn - deferred to gynecology Skin: several small skin tags of neck circumferential    Assessment: Encounter Diagnoses  Name Primary?  . Encounter for health maintenance examination in adult Yes  . Vaccine counseling   . Uterine leiomyoma, unspecified location   . Screening for diabetes mellitus   . Obesity with serious comorbidity, unspecified classification, unspecified obesity type   . Need for influenza vaccination   . Hypothyroidism, unspecified type   . History of anaphylaxis   . Goiter   . Extrinsic asthma without complication, unspecified asthma severity, unspecified whether persistent   . Food allergy   . Atopic dermatitis, unspecified type   . Screening for heart disease     Plan: Physical exam - discussed and counseled on healthy lifestyle, diet, exercise, preventative care, vaccinations, sick and well care, proper use of emergency dept and after hours care, and addressed their concerns.    Health screening: See your eye doctor yearly for routine vision care. See your dentist yearly for routine dental care including hygiene visits twice yearly. See your gynecologist yearly for routine gynecological care.  Hypothyroidism, goiter - labs today  Obesity - c/t efforts to lose weight.   Hx/o anaphylaxis - refilled Epipen today for prn use.  Discussed use of medicaiton  Food allergy - avoid triggers, keep epipen handy  Asthma - worse in spring, c/t preventative inhaler in spring, albuterol c/t prn    Vaccines: reviewed vaccines.   Still due for Shingrix  Shingles vaccine:  I recommend you have a shingles vaccine to help prevent shingles or herpes zoster outbreak.   Please call your insurer to inquire about coverage for the Shingrix vaccine given in 2 doses.   Some insurers  cover this vaccine after age 40, some cover this after age 17.  If your insurer covers this, then call to schedule appointment to have this vaccine here.    Cancer screening: reviewed up to date pap and mammogram.  She will frequent pap sent by gyn to [place] after next visit.  I reviewed your 2016 colonoscopy which was normal.   You did stool cards 2019 which were normal.  Your next screen would be stool cards again in next year, colonoscopy 2026 or potentially the Cologuard stool test.    Heart disease screen  You had an EKG 03/2019  I reviewed your echocardiogram from  2017 which was normal  Refer for CT coronary calcium score     Tamyrah was seen today for annual exam.  Diagnoses and all orders for this visit:  Encounter for health maintenance examination in adult -     Comprehensive metabolic panel -     TSH -     T4, free -     Lipid panel -     CBC -     Hemoglobin A1c -     POCT Urinalysis DIP (Proadvantage Device)  Vaccine counseling  Uterine leiomyoma, unspecified location  Screening for diabetes mellitus -     Hemoglobin A1c  Obesity with serious comorbidity, unspecified classification, unspecified obesity type  Need for influenza vaccination  Hypothyroidism, unspecified type -     TSH -     T4, free  History of anaphylaxis  Goiter -     TSH -     T4, free  Extrinsic asthma without complication, unspecified asthma severity, unspecified whether persistent  Food allergy  Atopic dermatitis, unspecified type  Screening for heart disease  Other orders -     CT CARDIAC SCORING (SELF PAY ONLY); Future   F/u pending labs

## 2020-07-18 ENCOUNTER — Other Ambulatory Visit: Payer: Self-pay | Admitting: Medical

## 2020-07-18 DIAGNOSIS — J45909 Unspecified asthma, uncomplicated: Secondary | ICD-10-CM

## 2020-07-18 LAB — COMPREHENSIVE METABOLIC PANEL
ALT: 13 IU/L (ref 0–32)
AST: 21 IU/L (ref 0–40)
Albumin/Globulin Ratio: 1.5 (ref 1.2–2.2)
Albumin: 4.3 g/dL (ref 3.8–4.9)
Alkaline Phosphatase: 96 IU/L (ref 44–121)
BUN/Creatinine Ratio: 16 (ref 9–23)
BUN: 16 mg/dL (ref 6–24)
Bilirubin Total: 0.3 mg/dL (ref 0.0–1.2)
CO2: 24 mmol/L (ref 20–29)
Calcium: 9.8 mg/dL (ref 8.7–10.2)
Chloride: 103 mmol/L (ref 96–106)
Creatinine, Ser: 0.98 mg/dL (ref 0.57–1.00)
Globulin, Total: 2.8 g/dL (ref 1.5–4.5)
Glucose: 76 mg/dL (ref 65–99)
Potassium: 4.3 mmol/L (ref 3.5–5.2)
Sodium: 140 mmol/L (ref 134–144)
Total Protein: 7.1 g/dL (ref 6.0–8.5)
eGFR: 68 mL/min/{1.73_m2} (ref >59)

## 2020-07-18 LAB — LIPID PANEL
Chol/HDL Ratio: 3.3 ratio (ref 0.0–4.4)
Cholesterol, Total: 207 mg/dL — ABNORMAL HIGH (ref 100–199)
HDL: 63 mg/dL (ref >39)
LDL Chol Calc (NIH): 130 mg/dL — ABNORMAL HIGH (ref 0–99)
Triglycerides: 77 mg/dL (ref 0–149)
VLDL Cholesterol Cal: 14 mg/dL (ref 5–40)

## 2020-07-18 LAB — HEMOGLOBIN A1C
Est. average glucose Bld gHb Est-mCnc: 103 mg/dL
Hgb A1c MFr Bld: 5.2 % (ref 4.8–5.6)

## 2020-07-18 LAB — CBC
Hematocrit: 35.7 % (ref 34.0–46.6)
Hemoglobin: 11.7 g/dL (ref 11.1–15.9)
MCH: 27.9 pg (ref 26.6–33.0)
MCHC: 32.8 g/dL (ref 31.5–35.7)
MCV: 85 fL (ref 79–97)
Platelets: 224 10*3/uL (ref 150–450)
RBC: 4.19 x10E6/uL (ref 3.77–5.28)
RDW: 13.3 % (ref 11.7–15.4)
WBC: 5.4 10*3/uL (ref 3.4–10.8)

## 2020-07-18 LAB — T4, FREE: Free T4: 1.45 ng/dL (ref 0.82–1.77)

## 2020-07-18 LAB — TSH: TSH: 0.879 u[IU]/mL (ref 0.450–4.500)

## 2020-07-18 MED ORDER — LEVOTHYROXINE SODIUM 100 MCG PO TABS: 100.0000 ug | ORAL_TABLET | Freq: Every day | ORAL | 3 refills | Status: DC

## 2020-07-18 MED ORDER — EPINEPHRINE 0.3 MG/0.3ML IJ SOAJ: 0.3000 mg | INTRAMUSCULAR | 1 refills | Status: DC | PRN

## 2020-07-18 MED ORDER — BREO ELLIPTA 200-25 MCG/INH IN AEPB: 1.0000 | INHALATION_SPRAY | Freq: Every day | RESPIRATORY_TRACT | 6 refills | Status: DC

## 2020-07-18 MED ORDER — LEVOCETIRIZINE DIHYDROCHLORIDE 5 MG PO TABS: 5.0000 mg | ORAL_TABLET | Freq: Every evening | ORAL | 3 refills | Status: DC

## 2020-07-18 MED ORDER — ALBUTEROL SULFATE HFA 108 (90 BASE) MCG/ACT IN AERS: 2.0000 | INHALATION_SPRAY | Freq: Four times a day (QID) | RESPIRATORY_TRACT | 1 refills | Status: DC | PRN

## 2020-07-30 ENCOUNTER — Other Ambulatory Visit: Payer: Self-pay | Admitting: Medical

## 2020-07-30 DIAGNOSIS — J45909 Unspecified asthma, uncomplicated: Secondary | ICD-10-CM

## 2020-08-20 LAB — HM MAMMOGRAPHY

## 2020-08-20 LAB — HM PAP SMEAR: HM Pap smear: NORMAL

## 2020-08-26 ENCOUNTER — Telehealth: Payer: Self-pay | Admitting: Medical

## 2020-08-26 NOTE — Telephone Encounter (Signed)
I am happy to report that her mammogram was normal, no worrisome findings.

## 2020-08-27 NOTE — Telephone Encounter (Signed)
Message has been sent to patient via mychart  

## 2020-08-30 ENCOUNTER — Telehealth: Payer: Self-pay

## 2020-08-30 ENCOUNTER — Encounter: Payer: Self-pay | Admitting: Medical

## 2020-08-30 NOTE — Telephone Encounter (Signed)
Patient called and stated she went to urgent care and they informed her she may have bell's palsy. Right side of face is numb and mouth hurts with difficulty opening. She was prescribed prednisone. Patient is being scheduled for a follow up appointment at our office next week.

## 2020-09-03 ENCOUNTER — Ambulatory Visit (INDEPENDENT_AMBULATORY_CARE_PROVIDER_SITE_OTHER): Payer: 59 | Admitting: Medical

## 2020-09-03 ENCOUNTER — Other Ambulatory Visit: Payer: Self-pay

## 2020-09-03 ENCOUNTER — Encounter: Payer: Self-pay | Admitting: Medical

## 2020-09-03 VITALS — BP 110/70 | HR 63 | Ht 69.0 in | Wt 275.8 lb

## 2020-09-03 DIAGNOSIS — R29810 Facial weakness: Secondary | ICD-10-CM

## 2020-09-03 DIAGNOSIS — E039 Hypothyroidism, unspecified: Secondary | ICD-10-CM

## 2020-09-03 DIAGNOSIS — R519 Headache, unspecified: Secondary | ICD-10-CM | POA: Diagnosis not present

## 2020-09-03 DIAGNOSIS — R6884 Jaw pain: Secondary | ICD-10-CM | POA: Diagnosis not present

## 2020-09-03 NOTE — Progress Notes (Signed)
Subjective: Chief Complaint  Patient presents with   Follow-up    Bell's palsy    Here for concern for bells palsy.   This past Thursday she noticed some discomfort of right face/jaw region, then mouth went numb like she had been to the dentist.  Had some drooling, and maybe vision seemed a little off or blurry last week but that resolved.  She denies weakness in the eyes or eyelid drooping..   No other symptoms.   Felt heavy in the face, had some headache.  Her symptoms have persisted since Thursday really unchanged.  When she eats it feels unusual on the right side of her face.  She denies loss of consciousness or confusion, no significantly slurred speech.  Otherwise normal state of health  She notes some weakness in the right hand during and then.  It did seem to get weak last week along with the other symptoms but has had tingling and weakness in the right hand in the past on and off  She ended up calling an urgent care last Thursday when the symptoms began.  She called them some hours later after the symptoms began.  She notes that she did not see them in person but ended up speaking with the provider, and apparently prednisone was called out for Bell's palsy but she never started the medication   Past Medical History:  Diagnosis Date   Allergy    Anemia    GERD (gastroesophageal reflux disease)    Goiter    Hashimoto thyroiditis    Obesity    Uterine fibroid    Current Outpatient Medications on File Prior to Visit  Medication Sig Dispense Refill   albuterol (VENTOLIN HFA) 108 (90 Base) MCG/ACT inhaler Inhale 2 puffs into the lungs every 6 (six) hours as needed for wheezing or shortness of breath. 18 g 1   CALCIUM PO Take 2 tablets by mouth at bedtime.      EPINEPHrine (EPIPEN 2-PAK) 0.3 mg/0.3 mL IJ SOAJ injection Inject 0.3 mg into the muscle as needed for anaphylaxis. 1 each 1   fluticasone furoate-vilanterol (BREO ELLIPTA) 200-25 MCG/INH AEPB Inhale 1 puff into the lungs daily.  28 each 6   levocetirizine (XYZAL) 5 MG tablet Take 1 tablet (5 mg total) by mouth every evening. 90 tablet 3   levothyroxine (SYNTHROID) 100 MCG tablet Take 1 tablet (100 mcg total) by mouth daily before breakfast. 90 tablet 3   Multiple Vitamin (MULTIVITAMIN WITH MINERALS) TABS Take 1 tablet by mouth every morning.      triamcinolone cream (KENALOG) 0.1 % Apply 1 application topically 2 (two) times daily. 45 g 1   No current facility-administered medications on file prior to visit.    Review of systems as in subjective   Objective: BP 110/70   Pulse 63   Ht 5\' 9"  (1.753 m)   Wt 275 lb 12.8 oz (125.1 kg)   SpO2 96%   BMI 40.73 kg/m   General appearence: alert, no distress, WD/WN, obese African-American female HEENT: normocephalic, sclerae anicteric, PERRLA, EOMi, nares patent, no discharge or erythema, pharynx normal Oral cavity: MMM, no lesions Neck: supple, no lymphadenopathy, no thyromegaly, no masses, no bruits Heart: RRR, normal S1, S2, no murmurs Lungs: CTA bilaterally, no wheezes, rhonchi, or rales Musculoskeletal: nontender, no swelling, no obvious deformity Extremities: no edema, no cyanosis, no clubbing Pulses: 2+ symmetric, upper and lower extremities, normal cap refill Neurological: No obvious asymmetry of face no obvious weakness of face,, although she  seems to have a little slight decrease sensation of right cheek and right jaw compared to the left, alert, oriented x 3, CN2-12 intact, strength normal upper extremities and lower extremities, sensation normal throughout, DTRs 2+ throughout, + slightly off balance with heel-to-toe and Romberg, gait normal Psychiatric: normal affect, behavior normal, pleasant     Assessment: Encounter Diagnoses  Name Primary?   Facial weakness Yes   Jaw pain    Acute nonintractable headache, unspecified headache type    Hypothyroidism, unspecified type      Plan: We discussed her symptoms and concerns.  She does not have  obvious Bell's palsy features of the face.  There could be subtle asymmetry but not obvious for Bell's.  She does have symptoms of a suggest Bell's.  I called and discussed case with supervising physician Dr. Tomi Bamberger who is out of the office today.    We discussed possible differential which could include Bell's palsy, MS, trigeminal neuralgia, temporal arteritis or or other vasculitis, less likely stroke, tumor or other.  Since her symptoms have persisted for several days unchanged no obvious indication for head imaging today  We will check labs below.  I will have Ms. Litaker follow-up in 1 week or sooner if any new or worsening symptoms  We discussed possibly doing head imaging his symptoms do not resolve or referral to neurology   Lylianna was seen today for follow-up.  Diagnoses and all orders for this visit:  Facial weakness -     CBC with Differential/Platelet -     Comprehensive metabolic panel -     Vitamin B12 -     TSH -     Sedimentation rate  Jaw pain -     CBC with Differential/Platelet -     Comprehensive metabolic panel -     Vitamin B12 -     TSH -     Sedimentation rate  Acute nonintractable headache, unspecified headache type -     CBC with Differential/Platelet -     Comprehensive metabolic panel -     Vitamin B12 -     TSH -     Sedimentation rate  Hypothyroidism, unspecified type -     CBC with Differential/Platelet -     Comprehensive metabolic panel -     Vitamin B12 -     TSH -     Sedimentation rate  F/u pending labs

## 2020-09-04 LAB — COMPREHENSIVE METABOLIC PANEL
ALT: 14 IU/L (ref 0–32)
AST: 18 IU/L (ref 0–40)
Albumin/Globulin Ratio: 1.8 (ref 1.2–2.2)
Albumin: 4.5 g/dL (ref 3.8–4.9)
Alkaline Phosphatase: 104 IU/L (ref 44–121)
BUN/Creatinine Ratio: 16 (ref 9–23)
BUN: 15 mg/dL (ref 6–24)
Bilirubin Total: 0.3 mg/dL (ref 0.0–1.2)
CO2: 19 mmol/L — ABNORMAL LOW (ref 20–29)
Calcium: 10.1 mg/dL (ref 8.7–10.2)
Chloride: 103 mmol/L (ref 96–106)
Creatinine, Ser: 0.91 mg/dL (ref 0.57–1.00)
Globulin, Total: 2.5 g/dL (ref 1.5–4.5)
Glucose: 82 mg/dL (ref 65–99)
Potassium: 4.7 mmol/L (ref 3.5–5.2)
Sodium: 139 mmol/L (ref 134–144)
Total Protein: 7 g/dL (ref 6.0–8.5)
eGFR: 75 mL/min/{1.73_m2} (ref >59)

## 2020-09-04 LAB — CBC WITH DIFFERENTIAL/PLATELET
Basophils Absolute: 0 10*3/uL (ref 0.0–0.2)
Basos: 0 %
EOS (ABSOLUTE): 0.4 10*3/uL (ref 0.0–0.4)
Eos: 7 %
Hematocrit: 36.9 % (ref 34.0–46.6)
Hemoglobin: 12.1 g/dL (ref 11.1–15.9)
Immature Grans (Abs): 0 10*3/uL (ref 0.0–0.1)
Immature Granulocytes: 0 %
Lymphocytes Absolute: 2.7 10*3/uL (ref 0.7–3.1)
Lymphs: 46 %
MCH: 27.7 pg (ref 26.6–33.0)
MCHC: 32.8 g/dL (ref 31.5–35.7)
MCV: 84 fL (ref 79–97)
Monocytes Absolute: 0.5 10*3/uL (ref 0.1–0.9)
Monocytes: 9 %
Neutrophils Absolute: 2.2 10*3/uL (ref 1.4–7.0)
Neutrophils: 38 %
Platelets: 243 10*3/uL (ref 150–450)
RBC: 4.37 x10E6/uL (ref 3.77–5.28)
RDW: 12.9 % (ref 11.7–15.4)
WBC: 5.9 10*3/uL (ref 3.4–10.8)

## 2020-09-04 LAB — SEDIMENTATION RATE: Sed Rate: 32 mm/hr (ref 0–40)

## 2020-09-04 LAB — VITAMIN B12: Vitamin B-12: 791 pg/mL (ref 232–1245)

## 2020-09-04 LAB — TSH: TSH: 2.19 u[IU]/mL (ref 0.450–4.500)

## 2020-10-10 ENCOUNTER — Ambulatory Visit (INDEPENDENT_AMBULATORY_CARE_PROVIDER_SITE_OTHER): Payer: 59 | Admitting: Medical

## 2020-10-10 ENCOUNTER — Encounter: Payer: Self-pay | Admitting: Medical

## 2020-10-10 ENCOUNTER — Other Ambulatory Visit: Payer: Self-pay

## 2020-10-10 VITALS — BP 126/78 | HR 69 | Temp 98.6°F | Wt 281.8 lb

## 2020-10-10 DIAGNOSIS — Z136 Encounter for screening for cardiovascular disorders: Secondary | ICD-10-CM

## 2020-10-10 DIAGNOSIS — E669 Obesity, unspecified: Secondary | ICD-10-CM | POA: Diagnosis not present

## 2020-10-10 NOTE — Progress Notes (Signed)
Subjective:  Denise Schroeder is a 55 y.o. female who presents for Chief Complaint  Patient presents with   other    Both feet were hurting feeling a little better today.      Here for foot pain bilat.  Just started this week.  Not sure if inflammation, shoe wear.  Trying some different shoes yesterday that helped.   Thinks it may have been a shoe issue.  No trauma, no injury, no fall.   She notes prior plantar fascitis, but it has been a while since last flare up.  Was wearing some flip flops.   She notes hx/o plantar fascitis in the past, prior steroid shots.   But had improvement after seeing chiropractor.     She has questions about heart wise screening she saw about online  No other aggravating or relieving factors.    No other c/o.  The following portions of the patient's history were reviewed and updated as appropriate: allergies, current medications, past family history, past medical history, past social history, past surgical history and problem list.  ROS Otherwise as in subjective above     Objective: BP 126/78   Pulse 69   Temp 98.6 F (37 C)   Wt 281 lb 12.8 oz (127.8 kg)   BMI 41.61 kg/m   General appearance: alert, no distress, well developed, well nourished MSK: tender bilat plantar fascia, somewhat reduced arches bilat, mild bunion bilat, otherwise feet nontender with normal ROM.   No swelling.  Feet neurovascularly intact     Assessment: Encounter Diagnoses  Name Primary?   Obesity with serious comorbidity, unspecified classification, unspecified obesity type Yes   Screening for heart disease      Plan: Discussed exam findings, symptoms, and etiology appears to be plantar fascitis and mild pes planus.  Discussed diagnosis, treatment recommendations, and follow up.     Plantar fascitis Every morning try doing a tennis ball massage with feet on the floor and towel stretch behind the ball of the foot to stretch the plantar fascia Order plantar  fascitis night time 90 degree splints online, through [employer] for example.   They are typically about $25 each Avoid going barefoot since your feet flatten out without good arch support I recommend you use arch support shoe inserts.   This will help support the plantar fascia and arches You can use over the counter pain reliever for worse pain F/u in a month   Screen for heart disease - referral for CT coronary score   [place] was seen today for other.  Diagnoses and all orders for this visit:  Obesity with serious comorbidity, unspecified classification, unspecified obesity type -     CT CARDIAC SCORING ([facility] LOCATIONS ONLY); Future  Screening for heart disease -     CT CARDIAC SCORING ([facility] LOCATIONS ONLY); Future   Follow up: 6mo

## 2020-10-31 ENCOUNTER — Other Ambulatory Visit: Payer: Self-pay

## 2020-10-31 ENCOUNTER — Ambulatory Visit
Admission: RE | Admit: 2020-10-31 | Discharge: 2020-10-31 | Disposition: A | Discharge status: Home / Self Care | Payer: No Typology Code available for payment source | Source: Ambulatory Visit | Attending: Medical | Admitting: Medical

## 2020-10-31 DIAGNOSIS — Z136 Encounter for screening for cardiovascular disorders: Secondary | ICD-10-CM

## 2020-10-31 DIAGNOSIS — E669 Obesity, unspecified: Secondary | ICD-10-CM

## 2020-11-01 ENCOUNTER — Other Ambulatory Visit: Payer: Self-pay | Admitting: Medical

## 2020-11-01 MED ORDER — ROSUVASTATIN CALCIUM 10 MG PO TABS: 10.0000 mg | ORAL_TABLET | Freq: Every day | ORAL | 3 refills | Status: DC

## 2020-11-04 ENCOUNTER — Telehealth: Payer: Self-pay | Admitting: Internal Medicine

## 2020-11-04 DIAGNOSIS — I2510 Atherosclerotic heart disease of native coronary artery without angina pectoris: Secondary | ICD-10-CM

## 2020-11-04 NOTE — Telephone Encounter (Signed)
Pt called and wants to know the results of grade on her CT. She does not know what 28 grade is. Is that a good thing, or bad thing. Please advise

## 2020-11-05 ENCOUNTER — Encounter: Payer: Self-pay | Admitting: Internal Medicine

## 2020-11-06 NOTE — Telephone Encounter (Signed)
Pt was notified and wanted referral to cardiology

## 2020-11-07 ENCOUNTER — Encounter: Payer: Self-pay | Admitting: Internal Medicine

## 2020-12-11 ENCOUNTER — Ambulatory Visit: Payer: Self-pay

## 2020-12-11 NOTE — Telephone Encounter (Signed)
Pt. Having back pain since September. Also reports she thinks she has a sinus infection. States her PCP has no availability this week. States she will "go to the new place at [place]."    Reason for Disposition  Patient sounds very sick or weak to the triager  Answer Assessment - Initial Assessment Questions 1. ONSET: "When did the pain begin?"      September 2. LOCATION: "Where does it hurt?" (upper, mid or lower back)     Mid back and at waist 3. SEVERITY: "How bad is the pain?"  (e.g., Scale 1-10; mild, moderate, or severe)   - MILD (1-3): doesn't interfere with normal activities    - MODERATE (4-7): interferes with normal activities or awakens from sleep    - SEVERE (8-10): excruciating pain, unable to do any normal activities      Now -8 4. PATTERN: "Is the pain constant?" (e.g., yes, no; constant, intermittent)      Constant 5. RADIATION: "Does the pain shoot into your legs or elsewhere?"     Left leg 6. CAUSE:  "What do you think is causing the back pain?"      Unsure 7. BACK OVERUSE:  "Any recent lifting of heavy objects, strenuous work or exercise?"     No 8. MEDICATIONS: "What have you taken so far for the pain?" (e.g., nothing, acetaminophen, NSAIDS)     Tylenol - helps a little 9. NEUROLOGIC SYMPTOMS: "Do you have any weakness, numbness, or problems with bowel/bladder control?"     No 10. OTHER SYMPTOMS: "Do you have any other symptoms?" (e.g., fever, abdominal pain, burning with urination, blood in urine)       No 11. PREGNANCY: "Is there any chance you are pregnant?" (e.g., yes, no; LMP)       No  Protocols used: Back Pain-A-AH

## 2020-12-12 ENCOUNTER — Telehealth: Payer: 59 | Admitting: Family Medicine

## 2021-01-06 ENCOUNTER — Other Ambulatory Visit: Payer: Self-pay

## 2021-01-06 ENCOUNTER — Other Ambulatory Visit (INDEPENDENT_AMBULATORY_CARE_PROVIDER_SITE_OTHER): Payer: 59

## 2021-01-06 DIAGNOSIS — Z23 Encounter for immunization: Secondary | ICD-10-CM

## 2021-01-21 ENCOUNTER — Ambulatory Visit (HOSPITAL_BASED_OUTPATIENT_CLINIC_OR_DEPARTMENT_OTHER): Payer: No Typology Code available for payment source | Admitting: Cardiology

## 2021-02-21 ENCOUNTER — Ambulatory Visit (INDEPENDENT_AMBULATORY_CARE_PROVIDER_SITE_OTHER): Payer: 59 | Admitting: Cardiology

## 2021-02-21 ENCOUNTER — Other Ambulatory Visit: Payer: Self-pay

## 2021-02-21 VITALS — BP 110/78 | HR 63 | Ht 69.0 in | Wt 280.5 lb

## 2021-02-21 DIAGNOSIS — E785 Hyperlipidemia, unspecified: Secondary | ICD-10-CM

## 2021-02-21 DIAGNOSIS — Z7189 Other specified counseling: Secondary | ICD-10-CM | POA: Diagnosis not present

## 2021-02-21 DIAGNOSIS — I2510 Atherosclerotic heart disease of native coronary artery without angina pectoris: Secondary | ICD-10-CM

## 2021-02-21 DIAGNOSIS — E7800 Pure hypercholesterolemia, unspecified: Secondary | ICD-10-CM

## 2021-02-21 NOTE — Progress Notes (Signed)
Cardiology Office Note:    Date:  02/21/2021   ID:  Denise Schroeder, DOB 01-24-1966, MRN [id number]  PCP:  Carlena Hurl, PA-C  Cardiologist:  Buford Dresser, MD  Referring MD: Carlena Hurl, PA-C   CC: new patient consultation for coronary artery calcification  History of Present Illness:    Denise Schroeder is a 55 y.o. female with a hx of anemia, GERD, goiter, Hashimoto thyroiditis, and obesity, who is seen as a new consult at the request of Carlena Hurl, PA-C for the evaluation and management of coronary artery calcification.  Calcium Score 10/2020 revealed a score of 28 at the 89th percentile and small calcified granuloma at the left lung base.  Cardiovascular risk factors: Prior clinical ASCVD: None Comorbid conditions: Hyperlipidemia (recently) Metabolic syndrome/Obesity: Highest adult weight 280 lbs (current) Chronic inflammatory conditions: none Tobacco use history: None Family history: Hypertension in her siblings. Her mother has hypertension, is living at 70 yo. Prior cardiac testing and/or incidental findings on other testing (ie coronary calcium): Echo 2017 Exercise level: Regularly walks, about 45 minutes for 3 days per week. 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 She endorses soreness and heaviness in her legs and arms. On days after she walks for prolonged times, she has difficulty ambulating her extremities. Current diet: Of note, she has an allergy to citrus. Typically she does not cook, and usually will avoid meats. We discussed possible meal combinations for her including frozen vegetables, yogurts, string cheese, and whole grains.  We discussed the results of her CT 10/2020 in detail. Also discussed preventative measures at length. She reports having myalgias in her extremities as side effects of 10 mg rosuvastatin.  She presents a picture of a plant-sterol medication that she is considering trying. Referenced and provided her a  link to a recent study with indications that plant-sterol medications may cause more side effects.  She denies any palpitations, chest pain, or shortness of breath. No lightheadedness, headaches, syncope, orthopnea, PND, or lower extremity edema.  Past Medical History:  Diagnosis Date   Allergy    Anemia    GERD (gastroesophageal reflux disease)    Goiter    Hashimoto thyroiditis    Obesity    Uterine fibroid     Past Surgical History:  Procedure Laterality Date   CHOLECYSTECTOMY     COLONOSCOPY  04/2014   normal, Dr. Erskine Emery   IR GENERIC HISTORICAL  05/31/2014   IR RADIOLOGIST EVAL & MGMT 05/31/2014 [facility] INTERV RAD   UTERINE FIBROID EMBOLIZATION  2016   Dr. Garwin Brothers    Current Medications: Current Outpatient Medications on File Prior to Visit  Medication Sig   albuterol (VENTOLIN HFA) 108 (90 Base) MCG/ACT inhaler Inhale 2 puffs into the lungs every 6 (six) hours as needed for wheezing or shortness of breath.   CALCIUM PO Take 2 tablets by mouth at bedtime.    EPINEPHrine (EPIPEN 2-PAK) 0.3 mg/0.3 mL IJ SOAJ injection Inject 0.3 mg into the muscle as needed for anaphylaxis.   levothyroxine (SYNTHROID) 100 MCG tablet Take 1 tablet (100 mcg total) by mouth daily before breakfast.   Multiple Vitamin (MULTIVITAMIN WITH MINERALS) TABS Take 1 tablet by mouth every morning.    rosuvastatin (CRESTOR) 10 MG tablet Take 1 tablet (10 mg total) by mouth daily.   triamcinolone cream (KENALOG) 0.1 % Apply 1 application topically 2 (two) times daily.   No current facility-administered medications on file prior to visit.  Allergies:   Citrus, Cashew nut (anacardium occidentale) skin test, Latex, and Sesame seed (diagnostic)   Social History   Tobacco Use   Smoking status: Never   Smokeless tobacco: Never  Vaping Use   Vaping Use: Never used  Substance Use Topics   Alcohol use: Yes    Alcohol/week: 1.0 standard drink    Types: 1 Glasses of wine per week    Comment:  Occassionally   Drug use: No    Family History: family history includes Asthma in her son; Cancer (age of onset: 49) in her mother; Diabetes in her maternal aunt, maternal uncle, and mother; Hypertension in her mother, sister, and sister; Other in her brother and father; Thyroid disease in her sister, sister, and sister. There is no history of Stroke, Heart disease, Hyperlipidemia, Colon cancer, Colon polyps, Kidney disease, Esophageal cancer, or Gallbladder disease.  ROS:   Please see the history of present illness.  Additional pertinent ROS: Constitutional: Negative for chills, fever, night sweats, unintentional weight loss  HENT: Negative for ear pain and hearing loss.   Eyes: Negative for loss of vision and eye pain.  Respiratory: Negative for cough, sputum, wheezing.   Cardiovascular: See HPI. Gastrointestinal: Negative for abdominal pain, melena, and hematochezia.  Genitourinary: Negative for dysuria and hematuria.  Musculoskeletal: Negative for falls. Positive for myalgias and heaviness in all 4 extremities. Skin: Negative for itching and rash.  Neurological: Negative for focal weakness, focal sensory changes and loss of consciousness.  Endo/Heme/Allergies: Does not bruise/bleed easily.     EKGs/Labs/Other Studies Reviewed:    The following studies were reviewed today:  Calcium Score 10/31/2020: FINDINGS: CORONARY CALCIUM SCORES:   Left Main: 0   LAD: 8   LCx: 2.7   RCA: 17.3   Total Agatston Score: 28   MESA database percentile: 89   AORTA MEASUREMENTS:   Ascending Aorta: 31 mm   Descending Aorta: 24 mm   OTHER FINDINGS:   The heart size is within normal limits. No pericardial fluid is identified. Visualized segments of the thoracic aorta and central pulmonary arteries are normal in caliber. Visualized mediastinum and hilar regions demonstrate no lymphadenopathy or masses. Small calcified granuloma present at the posterior left lung base. Visualized lungs  show no evidence of pulmonary edema, consolidation, pneumothorax or pleural fluid. Visualized upper abdomen and bony structures are unremarkable.   IMPRESSION: 1. Coronary calcium score of 28 is at the 89th percentile for the patient's age, sex and race. 2. Small calcified granuloma at the left lung base.  Echo 08/10/2015: Study Conclusions  - Left ventricle: The cavity size was normal. Systolic function was    normal. The estimated ejection fraction was in the range of 60%    to 65%. Wall motion was normal; there were no regional wall    motion abnormalities. Left ventricular diastolic function    parameters were normal.  - Atrial septum: No defect or patent foramen ovale was identified.   EKG:  EKG is personally reviewed.   02/21/2021: NSR at 63 bpm  Recent Labs: 09/03/2020: ALT 14; BUN 15; Creatinine, Ser 0.91; Hemoglobin 12.1; Platelets 243; Potassium 4.7; Sodium 139; TSH 2.190   Recent Lipid Panel    Component Value Date/Time   CHOL 207 (H) 07/17/2020 1536   TRIG 77 07/17/2020 1536   HDL 63 07/17/2020 1536   CHOLHDL 3.3 07/17/2020 1536   CHOLHDL 3.6 08/10/2015 0254   VLDL 13 08/10/2015 0254   LDLCALC 130 (H) 07/17/2020 1536    Physical  Exam:    VS:  BP 110/78 (BP Location: Right Arm, Patient Position: Sitting, Cuff Size: Large)   Pulse 63   Ht 5\' 9"  (1.753 m)   Wt 280 lb 8 oz (127.2 kg)   BMI 41.42 kg/m     Wt Readings from Last 3 Encounters:  02/21/21 280 lb 8 oz (127.2 kg)  10/10/20 281 lb 12.8 oz (127.8 kg)  09/03/20 275 lb 12.8 oz (125.1 kg)    GEN: Well nourished, well developed in no acute distress HEENT: Normal, moist mucous membranes NECK: No JVD CARDIAC: regular rhythm, normal S1 and S2, no rubs or gallops. No murmur. VASCULAR: Radial and DP pulses 2+ bilaterally. No carotid bruits RESPIRATORY:  Clear to auscultation without rales, wheezing or rhonchi  ABDOMEN: Soft, non-tender, non-distended MUSCULOSKELETAL:  Ambulates independently SKIN: Warm and  dry, no edema NEUROLOGIC:  Alert and oriented x 3. No focal neuro deficits noted. PSYCHIATRIC:  Normal affect    ASSESSMENT:    1. Pure hypercholesterolemia   2. Coronary artery calcification seen on CT scan   3. Nonocclusive coronary atherosclerosis of native coronary artery   4. Cardiac risk counseling   5. Counseling on health promotion and disease prevention    PLAN:    Coronary artery calcification, consistent with nonobstructive coronary artery disease -discussed pathology of cholesterol, how plaques form, that MI/CVA result commonly from acute plaque rupture and not gradual stenosis. Discussed mechanism of statin to both decrease plaque accumulation and stabilize plaque that is already present. Discussed that calcium is a marker for plaque, with decades of validated data regarding average amounts of calcium for age/gender/ethnicity, as well as value of calcium score for risk stratification  -we discussed the data on statins, both in terms of their long term benefit as well as the risk of side effects. Reviewed common misconceptions about statins. Reviewed how we monitor treatment.  -she will trial stopping rosuvastatin to see if symptoms improve. Will then rechallenge. If symptoms recur, would trial pravastatin -discussed recommendations for aspirin 81 mg today -discussed red flag warning signs that need immediate medical attention  Cardiac risk counseling and prevention recommendations: -recommend heart healthy/Mediterranean diet, with whole grains, fruits, vegetable, fish, lean meats, nuts, and olive oil. Limit salt. -recommend moderate walking, 3-5 times/week for 30-50 minutes each session. Aim for at least 150 minutes.week. Goal should be pace of 3 miles/hours, or walking 1.5 miles in 30 minutes -recommend avoidance of tobacco products. Avoid excess alcohol. -ASCVD risk score: The 10-year ASCVD risk score (Arnett DK, et al., 2019) is: 1.8%   Values used to calculate the score:      Age: 21 years     Sex: Female     Is Non-Hispanic African American: Yes     Diabetic: No     Tobacco smoker: No     Systolic Blood Pressure: 427 mmHg     Is BP treated: No     HDL Cholesterol: 63 mg/dL     Total Cholesterol: 207 mg/dL    Plan for follow up: 2 years or sooner PRN.  Buford Dresser, MD, PhD, [facility]    Medication Adjustments/Labs and Tests Ordered: Current medicines are reviewed at length with the patient today.  Concerns regarding medicines are outlined above.   Orders Placed This Encounter  Procedures   EKG 12-Lead    No orders of the defined types were placed in this encounter.   Patient Instructions  Medication Instructions:  Plan for the rosuvastatin:  Stop taking for 2-3 weeks. Then restart daily dosing. If symptoms get better off of the statin and then return when you restart it, stop the rosuvastatin and send me a message. We will then given you a few weeks off and trial pravastatin instead.   If you don't notice any change in your symptoms, it's likely not due to the rosuvastatin, so I would just continue it.  This is the link to the study about dietary supplements and cholesterol:  https://newsroom.[employer].dk  *If you need a refill on your cardiac medications before your next appointment, please call your pharmacy*   Lab Work: None ordered today   Testing/Procedures: None ordered today   Follow-Up: At [facility], you and your health needs are our priority.  As part of our continuing mission to provide you with exceptional heart care, we have created designated Provider Care Teams.  These Care Teams include your primary Cardiologist (physician) and Advanced Practice Providers (APPs -  Physician Assistants and Nurse Practitioners) who all work  together to provide you with the care you need, when you need it.  We recommend signing up for the patient portal called "MyChart".  Sign up information is provided on this After Visit Summary.  MyChart is used to connect with patients for Virtual Visits (Telemedicine).  Patients are able to view lab/test results, encounter notes, upcoming appointments, etc.  Non-urgent messages can be sent to your provider as well.   To learn more about what you can do with MyChart, go to [email or web address].    Your next appointment:   2 year(s)  The format for your next appointment:   In Person  Provider:   Buford Dresser, MD      [facility],acting as a scribe for Buford Dresser, MD.,have documented all relevant documentation on the behalf of Buford Dresser, MD,as directed by  Buford Dresser, MD while in the presence of Buford Dresser, MD.  I, Buford Dresser, MD, have reviewed all documentation for this visit. The documentation on 05/02/21 for the exam, diagnosis, procedures, and orders are all accurate and complete.   Signed, Buford Dresser, MD PhD 02/21/2021     [facility]

## 2021-02-21 NOTE — Patient Instructions (Signed)
Medication Instructions:  Plan for the rosuvastatin: Stop taking for 2-3 weeks. Then restart daily dosing. If symptoms get better off of the statin and then return when you restart it, stop the rosuvastatin and send me a message. We will then given you a few weeks off and trial pravastatin instead.   If you don't notice any change in your symptoms, it's likely not due to the rosuvastatin, so I would just continue it.  This is the link to the study about dietary supplements and cholesterol:  https://newsroom.[employer].dk  *If you need a refill on your cardiac medications before your next appointment, please call your pharmacy*   Lab Work: None ordered today   Testing/Procedures: None ordered today   Follow-Up: At [facility], you and your health needs are our priority.  As part of our continuing mission to provide you with exceptional heart care, we have created designated Provider Care Teams.  These Care Teams include your primary Cardiologist (physician) and Advanced Practice Providers (APPs -  Physician Assistants and Nurse Practitioners) who all work together to provide you with the care you need, when you need it.  We recommend signing up for the patient portal called "MyChart".  Sign up information is provided on this After Visit Summary.  MyChart is used to connect with patients for Virtual Visits (Telemedicine).  Patients are able to view lab/test results, encounter notes, upcoming appointments, etc.  Non-urgent messages can be sent to your provider as well.   To learn more about what you can do with MyChart, go to [email or web address].    Your next appointment:   2 year(s)  The format for your next appointment:   In Person  Provider:   Buford Dresser, MD

## 2021-03-20 ENCOUNTER — Telehealth: Payer: Self-pay

## 2021-03-20 DIAGNOSIS — M25569 Pain in unspecified knee: Secondary | ICD-10-CM

## 2021-03-20 NOTE — Telephone Encounter (Signed)
Pt. Called stating that she would like a referral to [facility] on [place]. She has been there in the past but it was about 3-4 years ago. She has really bad lt. Knee pain hurts a lot when she is trying to drive. If we could get the referral put in asap because it is hurting pretty bad.

## 2021-03-20 NOTE — Telephone Encounter (Signed)
I have put referral in

## 2021-03-26 ENCOUNTER — Encounter: Payer: Self-pay | Admitting: Family

## 2021-03-26 ENCOUNTER — Other Ambulatory Visit: Payer: Self-pay

## 2021-03-26 ENCOUNTER — Ambulatory Visit (INDEPENDENT_AMBULATORY_CARE_PROVIDER_SITE_OTHER): Payer: 59 | Admitting: Family

## 2021-03-26 ENCOUNTER — Ambulatory Visit (INDEPENDENT_AMBULATORY_CARE_PROVIDER_SITE_OTHER): Payer: 59

## 2021-03-26 DIAGNOSIS — M25562 Pain in left knee: Secondary | ICD-10-CM | POA: Diagnosis not present

## 2021-03-26 NOTE — Progress Notes (Signed)
Office Visit Note   Patient: Denise Schroeder           Date of Birth: 08/14/1965           MRN: [id number] Visit Date: 03/26/2021              Requested by: Carlena Hurl, PA-C [address] [place],  [place] [address] PCP: Carlena Hurl, PA-C  No chief complaint on file.     HPI: Patient is a 56 year old woman who presents today complaining of left knee pain since November.  There is no known injury that she can recall however she did have acute onset of her symptoms.  They have not subsided.  She is having primarily medial joint line pain some pain with extension difficulty getting comfortable at night due to pain describes the pain as burning aching throbbing  This is made worse by weightbearing  Denies mechanical symptoms Assessment & Plan: Visit Diagnoses:  1. Acute pain of left knee     Plan: Will send her for MRI to evaluate for meniscal pathology.  She will follow-up in the office with Dr. Sharol Given.  Offered Depo-Medrol injection today.  Patient declined.  Follow-Up Instructions: Return mr review.   Left Knee Exam   Muscle Strength  The patient has normal left knee strength.  Tenderness  The patient is experiencing tenderness in the medial joint line.  Range of Motion  The patient has normal left knee ROM.  Tests  Varus: negative Valgus: negative  Other  Erythema: absent Swelling: mild     Patient is alert, oriented, no adenopathy, well-dressed, normal affect, normal respiratory effort.   Imaging: XR Knee 1-2 Views Left  Result Date: 03/26/2021 Radiographs of left knee show significant degenerative changes in all 3 compartments. Medial > lateral narrowing. No acute finding.   No images are attached to the encounter.  Labs: Lab Results  Component Value Date   HGBA1C 5.2 07/17/2020   HGBA1C 5.3 07/17/2019   HGBA1C 4.9 11/10/2016   ESRSEDRATE 32 09/03/2020   LABORGA Normal Upper Respiratory Flora 07/12/2013   LABORGA No Beta Hemolytic  Streptococci Isolated 07/12/2013     Lab Results  Component Value Date   ALBUMIN 4.5 09/03/2020   ALBUMIN 4.3 07/17/2020   ALBUMIN 4.4 07/17/2019    No results found for: MG Lab Results  Component Value Date   VD25OH 41.7 07/17/2019   VD25OH 33 11/07/2014    No results found for: PREALBUMIN CBC EXTENDED Latest Ref Rng & Units 09/03/2020 07/17/2020 07/17/2019  WBC 3.4 - 10.8 x10E3/uL 5.9 5.4 6.3  RBC 3.77 - 5.28 x10E6/uL 4.37 4.19 4.26  HGB 11.1 - 15.9 g/dL 12.1 11.7 12.2  HCT 34.0 - 46.6 % 36.9 35.7 36.4  PLT 150 - 450 x10E3/uL 243 224 254  NEUTROABS 1.4 - 7.0 x10E3/uL 2.2 - 2.9  LYMPHSABS 0.7 - 3.1 x10E3/uL 2.7 - 2.5     There is no height or weight on file to calculate BMI.  Orders:  Orders Placed This Encounter  Procedures   XR Knee 1-2 Views Left   MR Knee Left w/o contrast   No orders of the defined types were placed in this encounter.    Procedures: No procedures performed  Clinical Data: No additional findings.  ROS:  All other systems negative, except as noted in the HPI. Review of Systems  Objective: Vital Signs: There were no vitals taken for this visit.  Specialty Comments:  No specialty comments available.  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/10/2017   Screening for diabetes mellitus 05/01/2016   Vaccine counseling 05/01/2016   Obesity with serious comorbidity 11/28/2015   Hypothyroidism 11/28/2015   History of anaphylaxis 11/28/2015   Goiter 11/28/2015   Uterine fibroid 06/12/2014    Past Medical History:  Diagnosis Date   Allergy    Anemia    GERD (gastroesophageal reflux disease)    Goiter    Hashimoto thyroiditis    Obesity    Uterine fibroid     Family History  Problem Relation Age of Onset   Diabetes Mother    Hypertension Mother    Cancer Mother 60       breast   Other Father        died in [place] when Caeley was 56yo   Hypertension Sister    Other Brother        died in MVA   Asthma Son    Diabetes Maternal Aunt    Diabetes Maternal Uncle    Hypertension Sister    Thyroid disease Sister    Thyroid disease Sister    Thyroid disease Sister    Stroke Neg Hx    Heart disease Neg Hx    Hyperlipidemia Neg Hx    Colon cancer Neg Hx    Colon polyps Neg Hx    Kidney disease Neg Hx    Esophageal cancer Neg Hx    Gallbladder disease Neg Hx     Past Surgical History:  Procedure Laterality Date   CHOLECYSTECTOMY     COLONOSCOPY  04/2014   normal, Dr. Erskine Emery   IR GENERIC HISTORICAL  05/31/2014   IR RADIOLOGIST EVAL & MGMT 05/31/2014 [facility] INTERV RAD   UTERINE FIBROID EMBOLIZATION  2016   Dr. Garwin Brothers   Social History   Occupational History   Occupation: Owner of [employer]  Tobacco Use   Smoking status: Never   Smokeless tobacco: Never  Vaping Use   Vaping Use: Never used  Substance and Sexual Activity   Alcohol use: Yes    Alcohol/week: 1.0 standard drink    Types: 1 Glasses of wine per week    Comment: Occassionally   Drug use: No   Sexual activity: Never    Birth control/protection: None

## 2021-04-18 ENCOUNTER — Telehealth: Payer: Self-pay | Admitting: Family

## 2021-04-18 ENCOUNTER — Other Ambulatory Visit: Payer: Self-pay | Admitting: Family

## 2021-04-18 DIAGNOSIS — M25562 Pain in left knee: Secondary | ICD-10-CM

## 2021-04-18 NOTE — Telephone Encounter (Signed)
Patient called. Says her insurance will not pay for MRI until she has physical therapy. She would like a referral for PT. Her call back number is [phone]

## 2021-04-18 NOTE — Telephone Encounter (Signed)
I spoke with pt, referral sent to [facility].

## 2021-04-19 ENCOUNTER — Other Ambulatory Visit: Payer: 59

## 2021-04-20 ENCOUNTER — Other Ambulatory Visit: Payer: 59

## 2021-05-02 ENCOUNTER — Encounter (HOSPITAL_BASED_OUTPATIENT_CLINIC_OR_DEPARTMENT_OTHER): Payer: Self-pay | Admitting: Cardiology

## 2021-05-06 ENCOUNTER — Encounter: Payer: Self-pay | Admitting: Rehabilitative and Restorative Service Providers"

## 2021-05-06 ENCOUNTER — Ambulatory Visit (INDEPENDENT_AMBULATORY_CARE_PROVIDER_SITE_OTHER): Payer: 59 | Admitting: Rehabilitative and Restorative Service Providers"

## 2021-05-06 ENCOUNTER — Other Ambulatory Visit: Payer: Self-pay

## 2021-05-06 DIAGNOSIS — R262 Difficulty in walking, not elsewhere classified: Secondary | ICD-10-CM | POA: Diagnosis not present

## 2021-05-06 DIAGNOSIS — M25562 Pain in left knee: Secondary | ICD-10-CM

## 2021-05-06 DIAGNOSIS — M6281 Muscle weakness (generalized): Secondary | ICD-10-CM

## 2021-05-06 DIAGNOSIS — G8929 Other chronic pain: Secondary | ICD-10-CM

## 2021-05-06 DIAGNOSIS — R600 Localized edema: Secondary | ICD-10-CM

## 2021-05-06 NOTE — Therapy (Signed)
OUTPATIENT PHYSICAL THERAPY LOWER EXTREMITY EVALUATION   Patient Name: Denise Schroeder MRN: [id number] DOB:October 27, 1965, 56 y.o., female Today's Date: 05/06/2021   PT End of Session - 05/06/21 1556     Visit Number 1    Number of Visits 20    Date for PT Re-Evaluation 07/15/21    Authorization Type [employer]    PT Start Time 1600    PT Stop Time 7673    PT Time Calculation (min) 38 min    Activity Tolerance Patient tolerated treatment well    Behavior During Therapy WFL for tasks assessed/performed             Past Medical History:  Diagnosis Date   Allergy    Anemia    GERD (gastroesophageal reflux disease)    Goiter    Hashimoto thyroiditis    Obesity    Uterine fibroid    Past Surgical History:  Procedure Laterality Date   CHOLECYSTECTOMY     COLONOSCOPY  04/2014   normal, Dr. Erskine Emery   IR [place]  05/31/2014   IR RADIOLOGIST EVAL & MGMT 05/31/2014 [facility] INTERV RAD   UTERINE FIBROID EMBOLIZATION  2016   Dr. Garwin Brothers   Patient Active Problem List   Diagnosis Date Noted   Facial weakness 09/03/2020   Jaw pain 09/03/2020   Acute intractable headache 09/03/2020   Acute nonintractable headache 09/03/2020   Encounter for health maintenance examination in adult 07/17/2020   Screening for heart disease 07/17/2020   Extrinsic asthma without complication 41/93/7902   Nosebleed 12/19/2019   Atopic dermatitis 07/17/2019   Allergic reaction 11/30/2018   Food allergy 11/30/2018   Granuloma annulare 11/10/2017   Screening for diabetes mellitus 05/01/2016   Vaccine counseling 05/01/2016   Obesity with serious comorbidity 11/28/2015   Hypothyroidism 11/28/2015   History of anaphylaxis 11/28/2015   Goiter 11/28/2015   Uterine fibroid 06/12/2014    PCP: Carlena Hurl, PA-C  REFERRING PROVIDER: Suzan Slick, NP  REFERRING DIAG: [phone] (ICD-10-CM) - Acute pain of left knee  THERAPY DIAG:  Chronic pain of left knee  Muscle weakness  (generalized)  Difficulty in walking, not elsewhere classified  Localized edema  ONSET DATE: November 2022  SUBJECTIVE:   SUBJECTIVE STATEMENT: 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  Patient indicated a twist and pop in Nov 2022 on Lt knee c pain the next day.  Pt stated she was having trouble walking due to symptoms and had to keep taking medicine.  Pt indicated sitting prolonged created burning complaints as well. Pt indicated various swelling at times and also noticed some trouble lying down to sleep on Lt side as well.   PERTINENT HISTORY: Unremarkable   PAIN:  Are you having pain? Yes NPRS scale: current: 6/10, at worst 10/10, at best 3/10 Pain location: knee, medially Pain orientation: Left  PAIN TYPE: chronic Pain description: burning, stiffness, achy Aggravating factors: sitting prolonged, squatting, standing/walking prolonged Relieving factors: some stretching , icing, propping it up, medicine  PRECAUTIONS: none  WEIGHT BEARING RESTRICTIONS No  FALLS:  Has patient fallen in last 6 months? No, Number of falls: 0   LIVING ENVIRONMENT: Lives with: lives with their family Lives in: House/apartment Stairs: 2nd story bedroom with difficulty noted. Rail on [place] [address] going up   OCCUPATION: Works in childcare center  PLOF: Independent, liked to walk for exercise  PATIENT GOALS Reduce pain, work and walk  without exercise   OBJECTIVE:   DIAGNOSTIC FINDINGS: Radiographs of left knee show significant degenerative changes in all 3 compartments. Medial > lateral narrowing. No acute finding.  PATIENT SURVEYS:  05/06/2021: FOTO intake: 49  predicted: 67  COGNITION:  05/06/2021:Overall cognitive status: Within functional limits for tasks assessed     SENSATION: 05/06/2021: Light touch: Appears intact     POSTURE:  05/06/2021: unremarkable  PALPATION: 05/06/2021: mild medial jt line tenderness, pes  anserine tenderness Lt knee   LE AROM/PROM:    A/PROM Right 05/06/2021 Left 05/06/2021  Hip flexion    Hip extension    Hip abduction    Hip adduction    Hip internal rotation    Hip external rotation    Knee flexion 122 AROM in supine heel slide  118 AROM in supine heel slide c increased difficulty compared to Rt  Knee extension 0 0  Ankle dorsiflexion    Ankle plantarflexion    Ankle inversion    Ankle eversion     (Blank rows = not tested)  LE MMT:  MMT Right 05/06/2021 Left 05/06/2021  Hip flexion 5/5 5/5  Hip extension    Hip abduction 4/5 3+/5  Hip adduction    Hip internal rotation    Hip external rotation    Knee flexion 5/5 5/5  Knee extension 5/5 31.1, 32.2 lbs 4/5 21.7, 18 lbs   Ankle dorsiflexion 5/5 5/5  Ankle plantarflexion    Ankle inversion    Ankle eversion     (Blank rows = not tested)    FUNCTIONAL TESTS:  05/06/2021:   Lt SLS:  14 c pulling in knee reported   Rt SLS:  16 seconds 18 inch chair transfer s UE with pain Lt knee Unable to assess eccentric step down due to pain complaints noted during step up movement, noted on each LE for 8 inch step  GAIT: 05/06/2021: Independent ambulation.    TODAY'S TREATMENT: 05/06/2021:   Therex:  HEP instruction/performance c cues for techniques, handout provided.  Trial set performed of each for comprehension and symptom assessment.  See below for exercise list.  Additional time spent to ensure proper techniques and procedures.  Also educated on adapting exercise routine as symptoms improve (possible water aerobics for example).  Instructed in sit to stand to sit strengthening but held from routine HEP use.    PATIENT EDUCATION:  05/06/2021: Education details: HEP, POC Person educated: Patient Education method: Consulting civil engineer, Demonstration, Verbal cues, and Handouts Education comprehension: verbalized understanding and returned demonstration   HOME EXERCISE PROGRAM: 05/06/2021: Access Code:  [email or web address] URL: [email or web address] Date: 05/06/2021 Prepared by: Scot Jun  Exercises Sidelying Hip Abduction - 1-2 x daily - 7 x weekly - 3 sets - 10-15 reps Seated Straight Leg Heel Taps - 1-2 x daily - 7 x weekly - 3 sets - 10 reps Seated Long Arc Quad - 1-2 x daily - 7 x weekly - 1 sets - 10 reps - 2 hold   ASSESSMENT:  CLINICAL IMPRESSION: Patient is a 56 y.o. who comes to clinic with complaints of Lt knee painwith mobility, strength and movement coordination deficits that impair their ability to perform usual daily and recreational functional activities without increase difficulty/symptoms at this time.  Patient to benefit from skilled PT services to address impairments and limitations to improve to previous level of function without restriction secondary to condition.    OBJECTIVE IMPAIRMENTS Abnormal gait, decreased activity tolerance, decreased balance, decreased coordination, decreased endurance, decreased mobility, difficulty walking,  decreased ROM, decreased strength, impaired flexibility, improper body mechanics, and pain.   ACTIVITY LIMITATIONS cleaning, community activity, occupation, laundry, and exercise routine (walking).   PERSONAL FACTORS unremarkable   REHAB POTENTIAL: Good  CLINICAL DECISION MAKING: Stable/uncomplicated  EVALUATION COMPLEXITY: Low   GOALS: Goals reviewed with patient? Yes 05/06/2021 eval:  SHORT TERM GOALS:  STG Name Target Date Goal status  1 Patient will demonstrate independent use of home exercise program to maintain progress from in clinic treatments.  05/27/2021 INITIAL                                 LONG TERM GOALS:   LTG Name Target Date Goal status  1 Patient will demonstrate/report pain at worst less than or equal to 2/10 to facilitate minimal limitation in daily activity secondary to pain symptoms.  07/15/2021 INITIAL  2 Patient will demonstrate independent use of home exercise program to facilitate  ability to maintain/progress functional gains from skilled physical therapy services.  07/15/2021 INITIAL  3 Patient will demonstrate FOTO outcome > or = 67 % to indicated reduced disability due to condition. 07/15/2021 INITIAL  4 Patient will demonstrate bilateral SLS > 30 seconds to facilitate stability in ambulation on even and uneven ground.  07/15/2021 INITIAL  5 Patient will demonstrate Lt LE MMT 5/5 throughout to facilitate ability to perform usual standing, walking, stairs at PLOF s limitation due to symptoms. 07/15/2021 INITIAL  6 Patient will demonstrate ability to ascend/descend stairs s UE assist c reciprocal gait pattern s restriction.  07/15/2021 INITIAL  7 Patient will demonstrate Lt knee extension dynamometry within 15% of Rt.  Baseline: 07/15/2021 INITIAL   PLAN: PT FREQUENCY: 1-2x/week  PT DURATION: 10 weeks  PLANNED INTERVENTIONS: Therapeutic exercises, Therapeutic activity, Neuro Muscular re-education, Balance training, Gait training, Patient/Family education, Joint mobilization, Stair training, DME instructions, Dry Needling, Electrical stimulation, Cryotherapy, Moist heat, Taping, Ultrasound, Ionotophoresis 4mg /ml Dexamethasone, and Manual therapy.  All included unless contraindicated  PLAN FOR NEXT SESSION: Review existing HEP knowledge   Scot Jun, PT, DPT, OCS, ATC 05/06/21  11:58 AM

## 2021-05-08 ENCOUNTER — Other Ambulatory Visit: Payer: Self-pay

## 2021-05-08 ENCOUNTER — Ambulatory Visit (INDEPENDENT_AMBULATORY_CARE_PROVIDER_SITE_OTHER): Payer: 59 | Admitting: Rehabilitative and Restorative Service Providers"

## 2021-05-08 ENCOUNTER — Encounter: Payer: Self-pay | Admitting: Rehabilitative and Restorative Service Providers"

## 2021-05-08 DIAGNOSIS — R262 Difficulty in walking, not elsewhere classified: Secondary | ICD-10-CM

## 2021-05-08 DIAGNOSIS — M25562 Pain in left knee: Secondary | ICD-10-CM

## 2021-05-08 DIAGNOSIS — G8929 Other chronic pain: Secondary | ICD-10-CM

## 2021-05-08 DIAGNOSIS — M6281 Muscle weakness (generalized): Secondary | ICD-10-CM | POA: Diagnosis not present

## 2021-05-08 DIAGNOSIS — R600 Localized edema: Secondary | ICD-10-CM

## 2021-05-08 NOTE — Therapy (Signed)
OUTPATIENT PHYSICAL THERAPY TREATMENT NOTE   Patient Name: Denise Schroeder MRN: [id number] DOB:1965-10-05, 56 y.o., female Today's Date: 05/08/2021  PCP: Carlena Hurl, PA-C REFERRING PROVIDER: Suzan Slick, NP   PT End of Session - 05/08/21 1530     Visit Number 2    Number of Visits 20    Date for PT Re-Evaluation 07/15/21    Authorization Type [employer]    PT Start Time 1518    PT Stop Time 1608    PT Time Calculation (min) 50 min    Activity Tolerance Patient tolerated treatment well    Behavior During Therapy [facility] for tasks assessed/performed             Past Medical History:  Diagnosis Date   Allergy    Anemia    GERD (gastroesophageal reflux disease)    Goiter    Hashimoto thyroiditis    Obesity    Uterine fibroid    Past Surgical History:  Procedure Laterality Date   CHOLECYSTECTOMY     COLONOSCOPY  04/2014   normal, Dr. Erskine Emery   IR [place]  05/31/2014   IR RADIOLOGIST EVAL & MGMT 05/31/2014 [facility] INTERV RAD   UTERINE FIBROID EMBOLIZATION  2016   Dr. Garwin Brothers   Patient Active Problem List   Diagnosis Date Noted   Facial weakness 09/03/2020   Jaw pain 09/03/2020   Acute intractable headache 09/03/2020   Acute nonintractable headache 09/03/2020   Encounter for health maintenance examination in adult 07/17/2020   Screening for heart disease 07/17/2020   Extrinsic asthma without complication 20/25/4270   Nosebleed 12/19/2019   Atopic dermatitis 07/17/2019   Allergic reaction 11/30/2018   Food allergy 11/30/2018   Granuloma annulare 11/10/2017   Screening for diabetes mellitus 05/01/2016   Vaccine counseling 05/01/2016   Obesity with serious comorbidity 11/28/2015   Hypothyroidism 11/28/2015   History of anaphylaxis 11/28/2015   Goiter 11/28/2015   Uterine fibroid 06/12/2014    REFERRING DIAG:  M25.562 (ICD-10-CM) - Acute pain of left knee  ONSET DATE: November 2022  THERAPY DIAG:  Chronic pain of left knee  Muscle  weakness (generalized)  Difficulty in walking, not elsewhere classified  Localized edema  PERTINENT HISTORY: Unremarkable   PRECAUTIONS: none  SUBJECTIVE: Patient indicated she had complaints of pain at night after last visit( the normal pain ).  Nothing specific today upon arrival.   PAIN:  Are you having pain? Yes NPRS scale: 0/10 upon arrival, 7/10 at worst the nighttime after first visit Pain location: knee  Pain orientation: Lt  PAIN TYPE: chronic  Pain description: sore/heavy Aggravating factors: nighttime, sitting prolonged, squatting Relieving factors: rest     OBJECTIVE:    DIAGNOSTIC FINDINGS: Radiographs of left knee show significant degenerative changes in all 3 compartments. Medial > lateral narrowing. No acute finding.   PATIENT SURVEYS:  05/06/2021: FOTO intake: 49  predicted: 67   COGNITION:          05/06/2021:Overall cognitive status: Within functional limits for tasks assessed                            SENSATION: 05/06/2021: Light touch: Appears intact               POSTURE:  05/06/2021: unremarkable   PALPATION: 05/06/2021: mild medial jt line tenderness, pes anserine tenderness Lt knee     LE AROM/PROM:       A/PROM Right 05/06/2021 Left  05/06/2021  Hip flexion      Hip extension      Hip abduction      Hip adduction      Hip internal rotation      Hip external rotation      Knee flexion 122 AROM in supine heel slide   118 AROM in supine heel slide c increased difficulty compared to Rt  Knee extension 0 0  Ankle dorsiflexion      Ankle plantarflexion      Ankle inversion      Ankle eversion       (Blank rows = not tested)   LE MMT:   MMT Right 05/06/2021 Left 05/06/2021  Hip flexion 5/5 5/5  Hip extension      Hip abduction 4/5 3+/5  Hip adduction      Hip internal rotation      Hip external rotation      Knee flexion 5/5 5/5  Knee extension 5/5 31.1, 32.2 lbs 4/5 21.7, 18 lbs    Ankle dorsiflexion 5/5 5/5  Ankle  plantarflexion      Ankle inversion      Ankle eversion       (Blank rows = not tested)       FUNCTIONAL TESTS:  05/06/2021:   Lt SLS:  14 c pulling in knee reported   Rt SLS:  16 seconds 18 inch chair transfer s UE with pain Lt knee Unable to assess eccentric step down due to pain complaints noted during step up movement, noted on each LE for 8 inch step   GAIT: 05/06/2021: Independent ambulation.       TODAY'S TREATMENT: 05/08/2021:  Therex: UBE LE lvl 4.0 6.5 mins    Seated SLR x 10 bilateral    Supine bridge 2 x 10      Leg press Double leg 87 lbs x 15, single leg 2 x 10 31 lbs      Verbal review of HEP    Neuro:  Tandem stance 1 min x1 bilateral on foam, SLS c cone touch (anterior, anterior/medial, anterior/lateral)    Vasopneumatic: Lt knee 10 mins 34 degrees medium compression   05/06/2021:           Therex:       HEP instruction/performance c cues for techniques, handout provided.  Trial set performed of each for comprehension and symptom assessment.  See below for exercise list.  Additional time spent to ensure proper techniques and procedures.  Also educated on adapting exercise routine as symptoms improve (possible water aerobics for example).  Instructed in sit to stand to sit strengthening but held from routine HEP use.      PATIENT EDUCATION:  05/06/2021: Education details: HEP, POC Person educated: Patient Education method: Consulting civil engineer, Demonstration, Verbal cues, and Handouts Education comprehension: verbalized understanding and returned demonstration     HOME EXERCISE PROGRAM: 05/06/2021: Access Code: [email or web address] URL: [email or web address] Date: 05/06/2021 Prepared by: Scot Jun   Exercises Sidelying Hip Abduction - 1-2 x daily - 7 x weekly - 3 sets - 10-15 reps Seated Straight Leg Heel Taps - 1-2 x daily - 7 x weekly - 3 sets - 10 reps Seated Long Arc Quad - 1-2 x daily - 7 x weekly - 1 sets - 10 reps - 2 hold     ASSESSMENT:    CLINICAL IMPRESSION: Good overall knowledge in review of HEP today.  Pt to benefit from progressive strengthening to improve ability to perform WB loaded activity  including stairs/squats.  Continued skilled PT services indicated.      OBJECTIVE IMPAIRMENTS Abnormal gait, decreased activity tolerance, decreased balance, decreased coordination, decreased endurance, decreased mobility, difficulty walking, decreased ROM, decreased strength, impaired flexibility, improper body mechanics, and pain.    ACTIVITY LIMITATIONS cleaning, community activity, occupation, laundry, and exercise routine (walking).    PERSONAL FACTORS unremarkable     REHAB POTENTIAL: Good   CLINICAL DECISION MAKING: Stable/uncomplicated   EVALUATION COMPLEXITY: Low     GOALS: Goals reviewed with patient? Yes 05/06/2021 eval:  SHORT TERM GOALS:   STG Name Target Date Goal status  1 Patient will demonstrate independent use of home exercise program to maintain progress from in clinic treatments.  05/27/2021 On going  05/08/2021                                                          LONG TERM GOALS:    LTG Name Target Date Goal status  1 Patient will demonstrate/report pain at worst less than or equal to 2/10 to facilitate minimal limitation in daily activity secondary to pain symptoms.  07/15/2021 INITIAL  2 Patient will demonstrate independent use of home exercise program to facilitate ability to maintain/progress functional gains from skilled physical therapy services.  07/15/2021 INITIAL  3 Patient will demonstrate FOTO outcome > or = 67 % to indicated reduced disability due to condition. 07/15/2021 INITIAL  4 Patient will demonstrate bilateral SLS > 30 seconds to facilitate stability in ambulation on even and uneven ground.  07/15/2021 INITIAL  5 Patient will demonstrate Lt LE MMT 5/5 throughout to facilitate ability to perform usual standing, walking, stairs at PLOF s limitation due to symptoms. 07/15/2021 INITIAL   6 Patient will demonstrate ability to ascend/descend stairs s UE assist c reciprocal gait pattern s restriction.  07/15/2021 INITIAL  7 Patient will demonstrate Lt knee extension dynamometry within 15% of Rt.  Baseline: 07/15/2021 INITIAL    PLAN: PT FREQUENCY: 1-2x/week   PT DURATION: 10 weeks   PLANNED INTERVENTIONS: Therapeutic exercises, Therapeutic activity, Neuro Muscular re-education, Balance training, Gait training, Patient/Family education, Joint mobilization, Stair training, DME instructions, Dry Needling, Electrical stimulation, Cryotherapy, Moist heat, Taping, Ultrasound, Ionotophoresis 4mg /ml Dexamethasone, and Manual therapy.  All included unless contraindicated   PLAN FOR NEXT SESSION: Progressive quad strengthening, WB transition strengthening as tolerated, balance intervention.     Scot Jun, PT, DPT, OCS, ATC 05/08/21  3:59 PM

## 2021-05-16 ENCOUNTER — Encounter: Payer: 59 | Admitting: Rehabilitative and Restorative Service Providers"

## 2021-05-20 ENCOUNTER — Encounter: Payer: 59 | Admitting: Rehabilitative and Restorative Service Providers"

## 2021-05-23 ENCOUNTER — Encounter: Payer: 59 | Admitting: Rehabilitative and Restorative Service Providers"

## 2021-05-26 ENCOUNTER — Ambulatory Visit: Payer: 59 | Admitting: Rehabilitative and Restorative Service Providers"

## 2021-05-26 ENCOUNTER — Encounter: Payer: Self-pay | Admitting: Rehabilitative and Restorative Service Providers"

## 2021-05-26 ENCOUNTER — Other Ambulatory Visit: Payer: Self-pay

## 2021-05-26 DIAGNOSIS — R262 Difficulty in walking, not elsewhere classified: Secondary | ICD-10-CM

## 2021-05-26 DIAGNOSIS — R600 Localized edema: Secondary | ICD-10-CM

## 2021-05-26 DIAGNOSIS — M6281 Muscle weakness (generalized): Secondary | ICD-10-CM

## 2021-05-26 DIAGNOSIS — M25562 Pain in left knee: Secondary | ICD-10-CM

## 2021-05-26 DIAGNOSIS — G8929 Other chronic pain: Secondary | ICD-10-CM

## 2021-05-26 NOTE — Therapy (Addendum)
OUTPATIENT PHYSICAL THERAPY TREATMENT NOTE   Patient Name: Denise Schroeder MRN: [id number] DOB:04-27-1965, 56 y.o., female Today's Date: 05/26/2021  PCP: Carlena Hurl, PA-C REFERRING PROVIDER: Suzan Slick, NP   PT End of Session - 05/26/21 1617     Visit Number 3    Number of Visits 20    Date for PT Re-Evaluation 07/15/21    Authorization Type [employer]    PT Start Time 1601    PT Stop Time 1634    PT Time Calculation (min) 33 min    Activity Tolerance Patient tolerated treatment well    Behavior During Therapy WFL for tasks assessed/performed              Past Medical History:  Diagnosis Date   Allergy    Anemia    GERD (gastroesophageal reflux disease)    Goiter    Hashimoto thyroiditis    Obesity    Uterine fibroid    Past Surgical History:  Procedure Laterality Date   CHOLECYSTECTOMY     COLONOSCOPY  04/2014   normal, Dr. Erskine Emery   IR [place]  05/31/2014   IR RADIOLOGIST EVAL & MGMT 05/31/2014 [facility] INTERV RAD   UTERINE FIBROID EMBOLIZATION  2016   Dr. Garwin Brothers   Patient Active Problem List   Diagnosis Date Noted   Facial weakness 09/03/2020   Jaw pain 09/03/2020   Acute intractable headache 09/03/2020   Acute nonintractable headache 09/03/2020   Encounter for health maintenance examination in adult 07/17/2020   Screening for heart disease 07/17/2020   Extrinsic asthma without complication 73/41/9379   Nosebleed 12/19/2019   Atopic dermatitis 07/17/2019   Allergic reaction 11/30/2018   Food allergy 11/30/2018   Granuloma annulare 11/10/2017   Screening for diabetes mellitus 05/01/2016   Vaccine counseling 05/01/2016   Obesity with serious comorbidity 11/28/2015   Hypothyroidism 11/28/2015   History of anaphylaxis 11/28/2015   Goiter 11/28/2015   Uterine fibroid 06/12/2014    REFERRING DIAG:  M25.562 (ICD-10-CM) - Acute pain of left knee  ONSET DATE: November 2022  THERAPY DIAG:  Chronic pain of left knee  Muscle  weakness (generalized)  Difficulty in walking, not elsewhere classified  Localized edema  PERTINENT HISTORY: Unremarkable   PRECAUTIONS: none  SUBJECTIVE:  Pt indicated having no pain complaints today upon arrival.  Pt indicated she was able to perform reciprocal gait pattern   PAIN:  Are you having pain? No NPRS scale: 0/10  Pain location: knee  Pain orientation: Lt  PAIN TYPE: chronic  Pain description: Aggravating factors:  Relieving factors:     OBJECTIVE:    DIAGNOSTIC FINDINGS: Radiographs of left knee show significant degenerative changes in all 3 compartments. Medial > lateral narrowing. No acute finding.   PATIENT SURVEYS:  05/26/2021: updated 49 05/06/2021: FOTO intake: 49  predicted: 67   COGNITION:          05/06/2021:Overall cognitive status: Within functional limits for tasks assessed                            SENSATION: 05/06/2021: Light touch: Appears intact               POSTURE:  05/06/2021: unremarkable   PALPATION: 05/06/2021: mild medial jt line tenderness, pes anserine tenderness Lt knee     LE AROM/PROM:       A/PROM Right 05/06/2021 Left 05/06/2021  Hip flexion      Hip extension  Hip abduction      Hip adduction      Hip internal rotation      Hip external rotation      Knee flexion 122 AROM in supine heel slide   118 AROM in supine heel slide c increased difficulty compared to Rt  Knee extension 0 0  Ankle dorsiflexion      Ankle plantarflexion      Ankle inversion      Ankle eversion       (Blank rows = not tested)   LE MMT:   MMT Right 05/06/2021 Left 05/06/2021  Hip flexion 5/5 5/5  Hip extension      Hip abduction 4/5 3+/5  Hip adduction      Hip internal rotation      Hip external rotation      Knee flexion 5/5 5/5  Knee extension 5/5 31.1, 32.2 lbs 4/5 21.7, 18 lbs    Ankle dorsiflexion 5/5 5/5  Ankle plantarflexion      Ankle inversion      Ankle eversion       (Blank rows = not tested)        FUNCTIONAL TESTS:  05/06/2021:   Lt SLS:  14 c pulling in knee reported   Rt SLS:  16 seconds 18 inch chair transfer s UE with pain Lt knee Unable to assess eccentric step down due to pain complaints noted during step up movement, noted on each LE for 8 inch step   GAIT: 05/06/2021: Independent ambulation.       TODAY'S TREATMENT: 05/26/2021:  Therex: Recumbent bike Lvl 2 10 mins     Seated SLR x 15 bilateral c slow focus         Step up lateral 4 inch x 12 bilateral c cues for home     Sit to stand to sit 18 inch chair x 10 c cues for home     Additional time spent in review of HEP with printout provided.         05/08/2021:  Therex: UBE LE lvl 4.0 6.5 mins    Seated SLR x 10 bilateral    Supine bridge 2 x 10      Leg press Double leg 87 lbs x 15, single leg 2 x 10 31 lbs      Verbal review of HEP    Neuro:  Tandem stance 1 min x1 bilateral on foam, SLS c cone touch (anterior, anterior/medial, anterior/lateral)    Vasopneumatic: Lt knee 10 mins 34 degrees medium compression   05/06/2021:           Therex:       HEP instruction/performance c cues for techniques, handout provided.  Trial set performed of each for comprehension and symptom assessment.  See below for exercise list.  Additional time spent to ensure proper techniques and procedures.  Also educated on adapting exercise routine as symptoms improve (possible water aerobics for example).  Instructed in sit to stand to sit strengthening but held from routine HEP use.      PATIENT EDUCATION:  05/06/2021: Education details: HEP, POC Person educated: Patient Education method: Consulting civil engineer, Demonstration, Verbal cues, and Handouts Education comprehension: verbalized understanding and returned demonstration     HOME EXERCISE PROGRAM: 05/06/2021: Access Code: [email or web address] URL: [email or web address] Date: 05/06/2021 Prepared by: Scot Jun   Exercises Sidelying Hip Abduction - 1-2 x daily - 7 x weekly -  3 sets - 10-15 reps Seated Straight Leg Heel Taps - 1-2  x daily - 7 x weekly - 3 sets - 10 reps Seated Long Arc Quad - 1-2 x daily - 7 x weekly - 1 sets - 10 reps - 2 hold     ASSESSMENT:   CLINICAL IMPRESSION: Additional time spent in clinic to improve knowledge and control for HEP for upcoming period of time with just HEP due to insurance cost which required her to reduce frequency.  Pt to benefit from continued skilled PT services c HEP to improve strength and control.      OBJECTIVE IMPAIRMENTS Abnormal gait, decreased activity tolerance, decreased balance, decreased coordination, decreased endurance, decreased mobility, difficulty walking, decreased ROM, decreased strength, impaired flexibility, improper body mechanics, and pain.    ACTIVITY LIMITATIONS cleaning, community activity, occupation, laundry, and exercise routine (walking).    PERSONAL FACTORS unremarkable     REHAB POTENTIAL: Good   CLINICAL DECISION MAKING: Stable/uncomplicated   EVALUATION COMPLEXITY: Low     GOALS: Goals reviewed with patient? Yes 05/06/2021 eval:  SHORT TERM GOALS:   STG Name Target Date Goal status  1 Patient will demonstrate independent use of home exercise program to maintain progress from in clinic treatments.  05/27/2021 MET Assessed 05/26/2021                                                          LONG TERM GOALS:    LTG Name Target Date Goal status  1 Patient will demonstrate/report pain at worst less than or equal to 2/10 to facilitate minimal limitation in daily activity secondary to pain symptoms.  07/15/2021 On going Assessed 05/26/2021  2 Patient will demonstrate independent use of home exercise program to facilitate ability to maintain/progress functional gains from skilled physical therapy services.  07/15/2021 On going Assessed 05/26/2021  3 Patient will demonstrate FOTO outcome > or = 67 % to indicated reduced disability due to condition. 07/15/2021 On going Assessed  05/26/2021  4 Patient will demonstrate bilateral SLS > 30 seconds to facilitate stability in ambulation on even and uneven ground.  07/15/2021 On going Assessed 05/26/2021  5 Patient will demonstrate Lt LE MMT 5/5 throughout to facilitate ability to perform usual standing, walking, stairs at PLOF s limitation due to symptoms. 07/15/2021 On going Assessed 05/26/2021  6 Patient will demonstrate ability to ascend/descend stairs s UE assist c reciprocal gait pattern s restriction.  07/15/2021 On going Assessed 05/26/2021  7 Patient will demonstrate Lt knee extension dynamometry within 15% of Rt.  Baseline: 07/15/2021 On going Assessed 05/26/2021    PLAN: PT FREQUENCY: 1-2x/week   PT DURATION: 10 weeks   PLANNED INTERVENTIONS: Therapeutic exercises, Therapeutic activity, Neuro Muscular re-education, Balance training, Gait training, Patient/Family education, Joint mobilization, Stair training, DME instructions, Dry Needling, Electrical stimulation, Cryotherapy, Moist heat, Taping, Ultrasound, Ionotophoresis 39m/ml Dexamethasone, and Manual therapy.  All included unless contraindicated   PLAN FOR NEXT SESSION: Progressive quad strengthening, WB transition strengthening as tolerated, balance intervention.     MScot Jun PT, DPT, OCS, ATC 05/26/21  4:38 PM

## 2021-05-28 ENCOUNTER — Encounter: Payer: 59 | Admitting: Rehabilitative and Restorative Service Providers"

## 2021-06-02 ENCOUNTER — Encounter: Payer: 59 | Admitting: Rehabilitative and Restorative Service Providers"

## 2021-06-04 ENCOUNTER — Encounter: Payer: 59 | Admitting: Rehabilitative and Restorative Service Providers"

## 2021-06-09 ENCOUNTER — Encounter: Payer: 59 | Admitting: Rehabilitative and Restorative Service Providers"

## 2021-06-11 ENCOUNTER — Encounter: Payer: 59 | Admitting: Rehabilitative and Restorative Service Providers"

## 2021-06-16 ENCOUNTER — Encounter: Payer: 59 | Admitting: Rehabilitative and Restorative Service Providers"

## 2021-06-18 ENCOUNTER — Encounter: Payer: 59 | Admitting: Rehabilitative and Restorative Service Providers"

## 2021-06-30 ENCOUNTER — Encounter: Payer: 59 | Admitting: Rehabilitative and Restorative Service Providers"

## 2021-07-27 ENCOUNTER — Other Ambulatory Visit: Payer: Self-pay | Admitting: Medical

## 2021-07-28 NOTE — Telephone Encounter (Signed)
Pt has appt later this month.

## 2021-08-06 ENCOUNTER — Ambulatory Visit: Payer: Commercial Managed Care - HMO | Admitting: Medical

## 2021-08-06 VITALS — BP 120/80 | HR 57 | Ht 68.0 in | Wt 278.0 lb

## 2021-08-06 DIAGNOSIS — Z0000 Encounter for general adult medical examination without abnormal findings: Secondary | ICD-10-CM | POA: Diagnosis not present

## 2021-08-06 DIAGNOSIS — E049 Nontoxic goiter, unspecified: Secondary | ICD-10-CM

## 2021-08-06 DIAGNOSIS — R1033 Periumbilical pain: Secondary | ICD-10-CM | POA: Insufficient documentation

## 2021-08-06 DIAGNOSIS — J45909 Unspecified asthma, uncomplicated: Secondary | ICD-10-CM

## 2021-08-06 DIAGNOSIS — Z87892 Personal history of anaphylaxis: Secondary | ICD-10-CM

## 2021-08-06 DIAGNOSIS — Z131 Encounter for screening for diabetes mellitus: Secondary | ICD-10-CM

## 2021-08-06 DIAGNOSIS — T50905A Adverse effect of unspecified drugs, medicaments and biological substances, initial encounter: Secondary | ICD-10-CM

## 2021-08-06 DIAGNOSIS — Z1159 Encounter for screening for other viral diseases: Secondary | ICD-10-CM

## 2021-08-06 DIAGNOSIS — I2510 Atherosclerotic heart disease of native coronary artery without angina pectoris: Secondary | ICD-10-CM

## 2021-08-06 DIAGNOSIS — L209 Atopic dermatitis, unspecified: Secondary | ICD-10-CM | POA: Diagnosis not present

## 2021-08-06 DIAGNOSIS — Z7185 Encounter for immunization safety counseling: Secondary | ICD-10-CM | POA: Diagnosis not present

## 2021-08-06 DIAGNOSIS — Z6841 Body Mass Index (BMI) 40.0 and over, adult: Secondary | ICD-10-CM

## 2021-08-06 DIAGNOSIS — E039 Hypothyroidism, unspecified: Secondary | ICD-10-CM

## 2021-08-06 DIAGNOSIS — Z1211 Encounter for screening for malignant neoplasm of colon: Secondary | ICD-10-CM

## 2021-08-06 DIAGNOSIS — M791 Myalgia, unspecified site: Secondary | ICD-10-CM

## 2021-08-06 MED ORDER — TRIAMCINOLONE ACETONIDE 0.1 % EX CREA: 1.0000 "application " | TOPICAL_CREAM | Freq: Two times a day (BID) | CUTANEOUS | 1 refills | Status: DC

## 2021-08-06 MED ORDER — ALBUTEROL SULFATE HFA 108 (90 BASE) MCG/ACT IN AERS: 2.0000 | INHALATION_SPRAY | Freq: Four times a day (QID) | RESPIRATORY_TRACT | 1 refills | Status: DC | PRN

## 2021-08-06 MED ORDER — EPINEPHRINE 0.3 MG/0.3ML IJ SOAJ: 0.3000 mg | INTRAMUSCULAR | 1 refills | Status: DC | PRN

## 2021-08-06 NOTE — Assessment & Plan Note (Signed)
Continue current medication, updated labs today

## 2021-08-06 NOTE — Assessment & Plan Note (Signed)
Continue efforts to lose weight through health diet and exercise.  Gave demo on [employer] and she has interest in starting this

## 2021-08-06 NOTE — Assessment & Plan Note (Signed)
Refilled triamcinolone for flares, I recommend daily moisturizing lotion for maintenance

## 2021-08-06 NOTE — Assessment & Plan Note (Signed)
Continue statin. However, having trouble with Crestor.  Labs today.   Consider lower dose, other medication, or add coenzyme q10 supplement.

## 2021-08-06 NOTE — Progress Notes (Signed)
Subjective:   HPI  Denise Schroeder is a 56 y.o. female who presents for Chief Complaint  Patient presents with   fasting cpe     Fasting cpe. Around belly button feels like a pulling sensation. Doesn't feel normal. Sees obgyn- Dr. Garwin Brothers- next month appt     Patient Care Team: Yaminah Clayborn, Camelia Eng, PA-C as PCP - General (Family Medicine) Buford Dresser, MD as PCP - Cardiology (Cardiology) Sees dentist Sees eye doctor Dondra Prader, NP, orthopedics Dr. Erskine Emery, GI Dr. Servando Salina, gyn  Concerns: For the last several months has discomfort at navel, but no bulge.   It burns at time.    Not eating after 8pm, doing more walking, has seen improvements in waist.  Working to lose weight.  Good with water intake  Compliant with medication for thyroid and cholesterol.  Does get achy or heaviness in legs with the cholesterol medication.   Reviewed their medical, surgical, family, social, medication, and allergy history and updated chart as appropriate.  Past Medical History:  Diagnosis Date   Allergy    Anemia    GERD (gastroesophageal reflux disease)    Goiter    Hashimoto thyroiditis    Obesity    Uterine fibroid     Family History  Problem Relation Age of Onset   Diabetes Mother    Hypertension Mother    Cancer Mother 54       breast   Other Father        died in [place] when Noami was 56yo   Hypertension Sister    Other Brother        died in MVA   Asthma Son    Diabetes Maternal Aunt    Diabetes Maternal Uncle    Hypertension Sister    Thyroid disease Sister    Thyroid disease Sister    Thyroid disease Sister    Stroke Neg Hx    Heart disease Neg Hx    Hyperlipidemia Neg Hx    Colon cancer Neg Hx    Colon polyps Neg Hx    Kidney disease Neg Hx    Esophageal cancer Neg Hx    Gallbladder disease Neg Hx      Current Outpatient Medications:    Aluminum & Magnesium Hydroxide (MAGNESIUM-ALUMINUM PO), Take by mouth., Disp: , Rfl:    BLACK  CURRANT SEED OIL PO, Take by mouth., Disp: , Rfl:    CALCIUM PO, Take 2 tablets by mouth at bedtime. , Disp: , Rfl:    Emollient (COLLAGEN EX), Apply topically., Disp: , Rfl:    levothyroxine (SYNTHROID) 100 MCG tablet, TAKE 1 TABLET(100 MCG) BY MOUTH DAILY BEFORE BREAKFAST, Disp: 30 tablet, Rfl: 0   Multiple Vitamin (MULTIVITAMIN WITH MINERALS) TABS, Take 1 tablet by mouth every morning. , Disp: , Rfl:    rosuvastatin (CRESTOR) 10 MG tablet, Take 1 tablet (10 mg total) by mouth daily., Disp: 90 tablet, Rfl: 3   albuterol (VENTOLIN HFA) 108 (90 Base) MCG/ACT inhaler, Inhale 2 puffs into the lungs every 6 (six) hours as needed for wheezing or shortness of breath., Disp: 18 g, Rfl: 1   EPINEPHrine (EPIPEN 2-PAK) 0.3 mg/0.3 mL IJ SOAJ injection, Inject 0.3 mg into the muscle as needed for anaphylaxis., Disp: 1 each, Rfl: 1   triamcinolone cream (KENALOG) 0.1 %, Apply 1 application. topically 2 (two) times daily., Disp: 45 g, Rfl: 1  Allergies  Allergen Reactions   Citrus Itching, Swelling and Cough    All  fruits cause this reaction   Cashew Nut (Anacardium Occidentale) Skin Test    Latex Hives   Sesame Seed (Diagnostic)     Past Surgical History:  Procedure Laterality Date   CHOLECYSTECTOMY     COLONOSCOPY  04/2014   normal, Dr. Erskine Emery   IR GENERIC HISTORICAL  05/31/2014   IR RADIOLOGIST EVAL & MGMT 05/31/2014 [facility] INTERV RAD   UTERINE FIBROID EMBOLIZATION  2016   Dr. Garwin Brothers      Review of Systems Constitutional: -fever, -chills, -sweats, -unexpected weight change, -decreased appetite, -fatigue Allergy: -sneezing, -itching, -congestion Dermatology: -changing moles, --rash, -lumps ENT: -runny nose, -ear pain, -sore throat, -hoarseness, -sinus pain, -teeth pain, - ringing in ears, -hearing loss, -nosebleeds Cardiology: -chest pain, -palpitations, -swelling, -difficulty breathing when lying flat, -waking up short of breath Respiratory: -cough, -shortness of breath, -difficulty  breathing with exercise or exertion, -wheezing, -coughing up blood Gastroenterology: -abdominal pain, -nausea, -vomiting, -diarrhea, -constipation, -blood in stool, -changes in bowel movement, -difficulty swallowing or eating Hematology: -bleeding, -bruising  Musculoskeletal: -joint aches, -muscle aches, -joint swelling, -back pain, -neck pain, -cramping, -changes in gait Ophthalmology: denies vision changes, eye redness, itching, discharge Urology: -burning with urination, -difficulty urinating, -blood in urine, -urinary frequency, -urgency, -incontinence Neurology: -headache, -weakness, -tingling, -numbness, -memory loss, -falls, -dizziness Psychology: -depressed mood, -agitation, -sleep problems Breast/gyn: -breast tendnerss, -discharge, -lumps, -vaginal discharge,- irregular periods, -heavy periods      08/06/2021   11:23 AM 07/17/2020    3:11 PM 07/17/2019    2:22 PM 11/02/2018    3:25 PM 05/11/2017    8:59 AM  Depression screen PHQ 2/9  Decreased Interest 0 0 0 0 0  Down, Depressed, Hopeless 0 0 0 0 0  PHQ - 2 Score 0 0 0 0 0        Objective:  BP 120/80   Pulse (!) 57   Ht '5\' 8"'$  (1.727 m)   Wt 278 lb (126.1 kg)   BMI 42.27 kg/m   Wt Readings from Last 3 Encounters:  08/06/21 278 lb (126.1 kg)  02/21/21 280 lb 8 oz (127.2 kg)  10/10/20 281 lb 12.8 oz (127.8 kg)   BP Readings from Last 3 Encounters:  08/06/21 120/80  02/21/21 110/78  10/10/20 126/78    General appearance: alert, no distress, WD/WN, African American female Skin: unremarkable, lateral to right nasal flare she has chronic unchanged raised brown 21m diameter lesion for years HEENT: normocephalic, conjunctiva/corneas normal, sclerae anicteric, PERRLA, EOMi Neck: supple, no lymphadenopathy, no thyromegaly, no masses, normal ROM, no bruits Chest: non tender, normal shape and expansion Heart: RRR, normal S1, S2, no murmurs Lungs: CTA bilaterally, no wheezes, rhonchi, or rales Abdomen: +bs, soft, tender at  superior portion of umbilicus without obvious hernia, otherwise non tender, non distended, no masses, no hepatomegaly, no splenomegaly, no bruits Back: non tender, normal ROM, no scoliosis Musculoskeletal: upper extremities non tender, no obvious deformity, normal ROM throughout, lower extremities non tender, no obvious deformity, normal ROM throughout Extremities: no edema, no cyanosis, no clubbing Pulses: 2+ symmetric, upper and lower extremities, normal cap refill Neurological: alert, oriented x 3, CN2-12 intact, strength normal upper extremities and lower extremities, sensation normal throughout, DTRs 2+ throughout, no cerebellar signs, gait normal Psychiatric: normal affect, behavior normal, pleasant  Breast/gyn/rectal - deferred to gynecology     Assessment and Plan :   Encounter Diagnoses  Name Primary?   Encounter for health maintenance examination in adult Yes   Atopic dermatitis, unspecified type  Vaccine counseling    Screening for diabetes mellitus    Class 3 severe obesity with serious comorbidity and body mass index (BMI) of 40.0 to 44.9 in adult, unspecified obesity type (HCC)    Hypothyroidism, unspecified type    History of anaphylaxis    Goiter    Extrinsic asthma without complication, unspecified asthma severity, unspecified whether persistent    Coronary artery disease involving native coronary artery of native heart without angina pectoris    Asthma due to environmental allergies    Encounter for hepatitis C screening test for low risk patient    Muscle ache    Adverse effect of drug, initial encounter    Screening for colon cancer    Periumbilical abdominal pain      This visit was a preventative care visit, also known as wellness visit or routine physical.   Topics typically include healthy lifestyle, diet, exercise, preventative care, vaccinations, sick and well care, proper use of emergency dept and after hours care, as well as other concerns.      Recommendations: Continue to return yearly for your annual wellness and preventative care visits.  This gives [place] a chance to discuss healthy lifestyle, exercise, vaccinations, review your chart record, and perform screenings where appropriate.  I recommend you see your eye doctor yearly for routine vision care.  I recommend you see your dentist yearly for routine dental care including hygiene visits twice yearly.   Vaccination recommendations were reviewed Immunization History  Administered Date(s) Administered   Influenza,inj,Quad PF,6+ Mos 11/07/2014, 11/10/2016, 11/10/2017, 11/29/2018, 12/19/2019, 01/06/2021   Influenza-Unspecified 11/28/2015   PFIZER(Purple Top)SARS-COV-2 Vaccination 06/12/2019, 07/05/2019, 03/06/2020   PPD Test 09/25/2003, 03/13/2011   Td 01/08/1999   Tdap 11/07/2014    Shingles vaccine:  I recommend you have a shingles vaccine to help prevent shingles or herpes zoster outbreak.   Please call your insurer to inquire about coverage for the Shingrix vaccine given in 2 doses.   Some insurers cover this vaccine after age 75, some cover this after age 70.  If your insurer covers this, then call to schedule appointment to have this vaccine here.   Screening for cancer: Colon cancer screening: I reviewed your colonoscopy on file that is up to date from 2016, normal, due repeat in 10 years Will send home with stool test for blood.    Breast cancer screening: You should perform a self breast exam monthly.   We reviewed recommendations for regular mammograms and breast cancer screening.  Cervical cancer screening: We reviewed recommendations for pap smear screening.   Skin cancer screening: Check your skin regularly for new changes, growing lesions, or other lesions of concern Come in for evaluation if you have skin lesions of concern.  Lung cancer screening: If you have a greater than 20 pack year history of tobacco use, then you may qualify for lung cancer  screening with a chest CT scan.   Please call your insurance company to inquire about coverage for this test.  We currently don't have screenings for other cancers besides breast, cervical, colon, and lung cancers.  If you have a strong family history of cancer or have other cancer screening concerns, please let me know.    Bone health: Get at least 150 minutes of aerobic exercise weekly Get weight bearing exercise at least once weekly Bone density test:  A bone density test is an imaging test that uses a type of X-ray to measure the amount of calcium and other minerals in your  bones. The test may be used to diagnose or screen you for a condition that causes weak or thin bones (osteoporosis), predict your risk for a broken bone (fracture), or determine how well your osteoporosis treatment is working. The bone density test is recommended for females 73 and older, or females or males <27 if certain risk factors such as thyroid disease, long term use of steroids such as for asthma or rheumatological issues, vitamin D deficiency, estrogen deficiency, family history of osteoporosis, self or family history of fragility fracture in first degree relative.    Heart health: Get at least 150 minutes of aerobic exercise weekly Limit alcohol It is important to maintain a healthy blood pressure and healthy cholesterol numbers  Heart disease screening: Screening for heart disease includes screening for blood pressure, fasting lipids, glucose/diabetes screening, BMI height to weight ratio, reviewed of smoking status, physical activity, and diet.    Goals include blood pressure 120/80 or less, maintaining a healthy lipid/cholesterol profile, preventing diabetes or keeping diabetes numbers under good control, not smoking or using tobacco products, exercising most days per week or at least 150 minutes per week of exercise, and eating healthy variety of fruits and vegetables, healthy oils, and avoiding unhealthy  food choices like fried food, fast food, high sugar and high cholesterol foods.     CT coronary calcium test 10/31/20 IMPRESSION: 1. Coronary calcium score of 28 is at the 89th percentile for the patient's age, sex and race. 2. Small calcified granuloma at the left lung base.    Medical care options: I recommend you continue to seek care here first for routine care.  We try really hard to have available appointments Monday through Friday daytime hours for sick visits, acute visits, and physicals.  Urgent care should be used for after hours and weekends for significant issues that cannot wait till the next day.  The emergency department should be used for significant potentially life-threatening emergencies.  The emergency department is expensive, can often have long wait times for less significant concerns, so try to utilize primary care, urgent care, or telemedicine when possible to avoid unnecessary trips to the emergency department.  Virtual visits and telemedicine have been introduced since the pandemic started in 2020, and can be convenient ways to receive medical care.  We offer virtual appointments as well to assist you in a variety of options to seek medical care.   Advanced Directives: I recommend you consider completing a [place] and Living Will.   These documents respect your wishes and help alleviate burdens on your loved ones if you were to become terminally ill or be in a position to need those documents enforced.    You can complete Advanced Directives yourself, have them notarized, then have copies made for our office, for you and for anybody you feel should have them in safe keeping.  Or, you can have an attorney prepare these documents.   If you haven't updated your Last Will and Testament in a while, it may be worthwhile having an attorney prepare these documents together and save on some costs.      Separate significant issues discussed:  Problem List  Items Addressed This Visit     Obesity with serious comorbidity    Continue efforts to lose weight through health diet and exercise.  Gave demo on [employer] and she has interest in starting this       Relevant Medications   Aluminum & Magnesium Hydroxide (MAGNESIUM-ALUMINUM PO)  Hypothyroidism    Continue current medication, updated labs today       Relevant Orders   TSH + free T4   History of anaphylaxis    Refilled epinephrine for prn use. Discussed proper use of medication       Goiter   Relevant Orders   TSH + free T4   Screening for diabetes mellitus   Relevant Orders   Hemoglobin A1c   Vaccine counseling   Atopic dermatitis    Refilled triamcinolone for flares, I recommend daily moisturizing lotion for maintenance       Extrinsic asthma without complication   Relevant Medications   albuterol (VENTOLIN HFA) 108 (90 Base) MCG/ACT inhaler   Encounter for health maintenance examination in adult - Primary   Relevant Orders   Comprehensive metabolic panel   CBC   Lipid panel   Hemoglobin A1c   TSH + free T4   Urinalysis   Hepatitis C antibody   CK   Coronary artery disease involving native coronary artery of native heart without angina pectoris    Continue statin. However, having trouble with Crestor.  Labs today.   Consider lower dose, other medication, or add coenzyme q10 supplement.         Relevant Medications   EPINEPHrine (EPIPEN 2-PAK) 0.3 mg/0.3 mL IJ SOAJ injection   Drug reaction   Relevant Orders   CK   Asthma due to environmental allergies    Mild intermittent, continue albuterol prn       Relevant Medications   albuterol (VENTOLIN HFA) 108 (90 Base) MCG/ACT inhaler   Periumbilical abdominal pain    Possible hernia vs other etiology.  Will use watch and wait approach.  Declines CT scan for now.  discussed possible symptoms and complications of umbilical hernia or connective tissue kinking or other possible etiology       Other  Visit Diagnoses     Encounter for hepatitis C screening test for low risk patient       Relevant Orders   Hepatitis C antibody   Muscle ache       Relevant Orders   CK   Screening for colon cancer       Relevant Orders   Fecal occult blood, imunochemical        Follow-up pending labs, yearly for physical

## 2021-08-06 NOTE — Assessment & Plan Note (Signed)
Possible hernia vs other etiology.  Will use watch and wait approach.  Declines CT scan for now.  discussed possible symptoms and complications of umbilical hernia or connective tissue kinking or other possible etiology

## 2021-08-06 NOTE — Assessment & Plan Note (Signed)
Refilled epinephrine for prn use. Discussed proper use of medication

## 2021-08-06 NOTE — Assessment & Plan Note (Signed)
Mild intermittent, continue albuterol prn

## 2021-08-07 ENCOUNTER — Other Ambulatory Visit: Payer: Self-pay | Admitting: Medical

## 2021-08-07 LAB — COMPREHENSIVE METABOLIC PANEL
ALT: 15 IU/L (ref 0–32)
AST: 17 IU/L (ref 0–40)
Albumin/Globulin Ratio: 1.6 (ref 1.2–2.2)
Albumin: 4.2 g/dL (ref 3.8–4.9)
Alkaline Phosphatase: 91 IU/L (ref 44–121)
BUN/Creatinine Ratio: 21 (ref 9–23)
BUN: 18 mg/dL (ref 6–24)
Bilirubin Total: 0.3 mg/dL (ref 0.0–1.2)
CO2: 23 mmol/L (ref 20–29)
Calcium: 9.6 mg/dL (ref 8.7–10.2)
Chloride: 104 mmol/L (ref 96–106)
Creatinine, Ser: 0.87 mg/dL (ref 0.57–1.00)
Globulin, Total: 2.6 g/dL (ref 1.5–4.5)
Glucose: 83 mg/dL (ref 70–99)
Potassium: 4.7 mmol/L (ref 3.5–5.2)
Sodium: 140 mmol/L (ref 134–144)
Total Protein: 6.8 g/dL (ref 6.0–8.5)
eGFR: 78 mL/min/{1.73_m2} (ref >59)

## 2021-08-07 LAB — LIPID PANEL
Chol/HDL Ratio: 2.9 ratio (ref 0.0–4.4)
Cholesterol, Total: 164 mg/dL (ref 100–199)
HDL: 57 mg/dL (ref >39)
LDL Chol Calc (NIH): 98 mg/dL (ref 0–99)
Triglycerides: 43 mg/dL (ref 0–149)
VLDL Cholesterol Cal: 9 mg/dL (ref 5–40)

## 2021-08-07 LAB — URINALYSIS
Bilirubin, UA: NEGATIVE
Glucose, UA: NEGATIVE
Ketones, UA: NEGATIVE
Leukocytes,UA: NEGATIVE
Nitrite, UA: NEGATIVE
Protein,UA: NEGATIVE
RBC, UA: NEGATIVE
Specific Gravity, UA: 1.01 (ref 1.005–1.030)
Urobilinogen, Ur: 0.2 mg/dL (ref 0.2–1.0)
pH, UA: 6 (ref 5.0–7.5)

## 2021-08-07 LAB — CBC
Hematocrit: 35.1 % (ref 34.0–46.6)
Hemoglobin: 11.5 g/dL (ref 11.1–15.9)
MCH: 27.8 pg (ref 26.6–33.0)
MCHC: 32.8 g/dL (ref 31.5–35.7)
MCV: 85 fL (ref 79–97)
Platelets: 234 10*3/uL (ref 150–450)
RBC: 4.13 x10E6/uL (ref 3.77–5.28)
RDW: 13 % (ref 11.7–15.4)
WBC: 5.6 10*3/uL (ref 3.4–10.8)

## 2021-08-07 LAB — TSH+FREE T4
Free T4: 1.42 ng/dL (ref 0.82–1.77)
TSH: 1.55 u[IU]/mL (ref 0.450–4.500)

## 2021-08-07 LAB — HEMOGLOBIN A1C
Est. average glucose Bld gHb Est-mCnc: 108 mg/dL
Hgb A1c MFr Bld: 5.4 % (ref 4.8–5.6)

## 2021-08-07 LAB — CK: Total CK: 210 U/L — ABNORMAL HIGH (ref 32–182)

## 2021-08-07 LAB — HEPATITIS C ANTIBODY: Hep C Virus Ab: NONREACTIVE

## 2021-08-07 MED ORDER — WEGOVY 0.5 MG/0.5ML ~~LOC~~ SOAJ: 0.5000 mg | SUBCUTANEOUS | 0 refills | Status: DC

## 2021-08-07 MED ORDER — WEGOVY 0.25 MG/0.5ML ~~LOC~~ SOAJ: 0.2500 mg | SUBCUTANEOUS | 0 refills | Status: DC

## 2021-08-07 MED ORDER — LEVOTHYROXINE SODIUM 100 MCG PO TABS: ORAL_TABLET | ORAL | 3 refills | Status: DC

## 2021-08-25 LAB — HM PAP SMEAR: HM Pap smear: NEGATIVE

## 2021-08-25 LAB — RESULTS CONSOLE HPV: CHL HPV: NEGATIVE

## 2021-08-26 ENCOUNTER — Other Ambulatory Visit: Payer: Self-pay | Admitting: Obstetrics and Gynecology

## 2021-08-26 DIAGNOSIS — E049 Nontoxic goiter, unspecified: Secondary | ICD-10-CM

## 2021-08-26 LAB — HM MAMMOGRAPHY

## 2021-08-29 ENCOUNTER — Encounter: Payer: Self-pay | Admitting: Internal Medicine

## 2021-08-29 ENCOUNTER — Encounter: Payer: Self-pay | Admitting: Medical

## 2021-09-01 ENCOUNTER — Ambulatory Visit
Admission: RE | Admit: 2021-09-01 | Discharge: 2021-09-01 | Disposition: A | Discharge status: Home / Self Care | Payer: 59 | Source: Ambulatory Visit | Attending: Obstetrics and Gynecology | Admitting: Obstetrics and Gynecology

## 2021-09-01 DIAGNOSIS — E049 Nontoxic goiter, unspecified: Secondary | ICD-10-CM

## 2021-09-12 ENCOUNTER — Other Ambulatory Visit: Payer: Self-pay | Admitting: Obstetrics and Gynecology

## 2021-09-12 DIAGNOSIS — E041 Nontoxic single thyroid nodule: Secondary | ICD-10-CM

## 2021-09-17 ENCOUNTER — Ambulatory Visit
Admission: RE | Admit: 2021-09-17 | Discharge: 2021-09-17 | Disposition: A | Discharge status: Home / Self Care | Payer: 59 | Source: Ambulatory Visit | Attending: Obstetrics and Gynecology | Admitting: Obstetrics and Gynecology

## 2021-09-17 ENCOUNTER — Other Ambulatory Visit (HOSPITAL_COMMUNITY)
Admission: RE | Admit: 2021-09-17 | Discharge: 2021-09-17 | Disposition: A | Discharge status: Home / Self Care | Payer: 59 | Source: Ambulatory Visit | Attending: Interventional Radiology | Admitting: Interventional Radiology

## 2021-09-17 DIAGNOSIS — E041 Nontoxic single thyroid nodule: Secondary | ICD-10-CM | POA: Diagnosis present

## 2021-09-18 LAB — CYTOLOGY - NON PAP

## 2021-09-29 ENCOUNTER — Encounter (HOSPITAL_COMMUNITY): Payer: Self-pay

## 2021-10-02 ENCOUNTER — Encounter: Payer: Self-pay | Admitting: Internal Medicine

## 2021-11-11 ENCOUNTER — Emergency Department (HOSPITAL_BASED_OUTPATIENT_CLINIC_OR_DEPARTMENT_OTHER)
Admission: EM | Admit: 2021-11-11 | Discharge: 2021-11-12 | Disposition: A | Discharge status: Home / Self Care | Payer: Commercial Managed Care - HMO | Attending: Emergency Medicine | Admitting: Emergency Medicine

## 2021-11-11 ENCOUNTER — Other Ambulatory Visit: Payer: Self-pay

## 2021-11-11 ENCOUNTER — Encounter (HOSPITAL_BASED_OUTPATIENT_CLINIC_OR_DEPARTMENT_OTHER): Payer: Self-pay | Admitting: Emergency Medicine

## 2021-11-11 DIAGNOSIS — Z91040 Latex allergy status: Secondary | ICD-10-CM | POA: Insufficient documentation

## 2021-11-11 DIAGNOSIS — U071 COVID-19: Secondary | ICD-10-CM | POA: Diagnosis not present

## 2021-11-11 DIAGNOSIS — Z79899 Other long term (current) drug therapy: Secondary | ICD-10-CM | POA: Insufficient documentation

## 2021-11-11 DIAGNOSIS — R059 Cough, unspecified: Secondary | ICD-10-CM | POA: Diagnosis present

## 2021-11-11 DIAGNOSIS — E039 Hypothyroidism, unspecified: Secondary | ICD-10-CM | POA: Diagnosis not present

## 2021-11-11 NOTE — ED Triage Notes (Signed)
Started feeling covid symptoms today around noon at work [employer] a home covid test + C/o a cough an "my breathing", wheezing Did not use home inhaler Took Robitussin and "tylenol sinus pills" around 10:30PM

## 2021-11-12 LAB — RESP PANEL BY RT-PCR (FLU A&B, COVID) ARPGX2
Influenza A by PCR: NEGATIVE
Influenza B by PCR: NEGATIVE
SARS Coronavirus 2 by RT PCR: POSITIVE — AB

## 2021-11-12 MED ORDER — MOLNUPIRAVIR EUA 200MG CAPSULE: 4.0000 | ORAL_CAPSULE | Freq: Two times a day (BID) | ORAL | 0 refills | Status: AC

## 2021-11-12 NOTE — Discharge Instructions (Signed)
Drink plenty of fluids and get plenty of rest.  Take over-the-counter medications as needed for relief of symptoms.  Take Tylenol 1000 mg rotated with ibuprofen 600 mg every 4 hours as needed for pain or fever.  Isolate at home for the next 5 days or until no longer symptomatic.  Return to the emergency department if you develop severe chest pain, difficulty breathing, or for other new and concerning symptoms.

## 2021-11-12 NOTE — ED Provider Notes (Signed)
[facility] EM[facility]ENC[facility] DEPT Provider Note   CSN: [id number] Arrival date & time: 11/11/21  2321     History  Chief Complaint  Patient presents with   Cough    Denise Schroeder is a 56 y.o. female.  Patient is a 56 year old female with history of hypothyroidism.  Patient presenting today with complaints of cough.  This started earlier this morning and is worsening.  She describes headache, sore throat, and body aches.  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  She denies chest pain or difficulty breathing.  There are no aggravating or alleviating factors.  The history is provided by the patient.       Home Medications Prior to Admission medications   Medication Sig Start Date End Date Taking? Authorizing Provider  albuterol (VENTOLIN HFA) 108 (90 Base) MCG/ACT inhaler Inhale 2 puffs into the lungs every 6 (six) hours as needed for wheezing or shortness of breath. 08/06/21   Tysinger, Camelia Eng, PA-C  Aluminum & Magnesium Hydroxide (MAGNESIUM-ALUMINUM PO) Take by mouth.    [provider]  BLACK CURRANT SEED OIL PO Take by mouth.    [provider]  CALCIUM PO Take 2 tablets by mouth at bedtime.     [provider]  Emollient (COLLAGEN EX) Apply topically.    [provider]  EPINEPHrine (EPIPEN 2-PAK) 0.3 mg/0.3 mL IJ SOAJ injection Inject 0.3 mg into the muscle as needed for anaphylaxis. 08/06/21   Tysinger, Camelia Eng, PA-C  levothyroxine (SYNTHROID) 100 MCG tablet TAKE 1 TABLET(100 MCG) BY MOUTH DAILY BEFORE BREAKFAST 08/07/21   Tysinger, Camelia Eng, PA-C  Multiple Vitamin (MULTIVITAMIN WITH MINERALS) TABS Take 1 tablet by mouth every morning.     [provider]  Semaglutide-Weight Management (WEGOVY) 0.25 MG/0.5ML SOAJ Inject 0.25 mg into the skin once a week. 08/07/21   Tysinger, Camelia Eng, PA-C  Semaglutide-Weight Management (WEGOVY) 0.5 MG/0.5ML SOAJ Inject 0.5 mg into the skin  once a week. 08/07/21   Tysinger, Camelia Eng, PA-C  triamcinolone cream (KENALOG) 0.1 % Apply 1 application. topically 2 (two) times daily. 08/06/21   Tysinger, Camelia Eng, PA-C      Allergies    Citrus, Cashew nut (anacardium occidentale) skin test, Latex, and Sesame seed (diagnostic)    Review of Systems   Review of Systems  All other systems reviewed and are negative.   Physical Exam Updated Vital Signs BP 122/77 (BP Location: Right Arm)   Pulse 75   Temp 99.3 F (37.4 C)   Resp 17   SpO2 97%  Physical Exam Vitals and nursing note reviewed.  Constitutional:      General: She is not in acute distress.    Appearance: She is well-developed. She is not diaphoretic.  HENT:     Head: Normocephalic and atraumatic.  Cardiovascular:     Rate and Rhythm: Normal rate and regular rhythm.     Heart sounds: No murmur heard.    No friction rub. No gallop.  Pulmonary:     Effort: Pulmonary effort is normal. No respiratory distress.     Breath sounds: Normal breath sounds. No wheezing.  Abdominal:     General: Bowel sounds are normal. There is no distension.     Palpations: Abdomen is soft.     Tenderness: There is no abdominal tenderness.  Musculoskeletal:        General: Normal range of motion.     Cervical back: Normal range  of motion and neck supple.  Skin:    General: Skin is warm and dry.  Neurological:     General: No focal deficit present.     Mental Status: She is alert and oriented to person, place, and time.     ED Results / Procedures / Treatments   Labs (all labs ordered are listed, but only abnormal results are displayed) Labs Reviewed  RESP PANEL BY RT-PCR (FLU A&B, COVID) ARPGX2 - Abnormal; Notable for the following components:      Result Value   SARS Coronavirus 2 by RT PCR POSITIVE (*)    All other components within normal limits    EKG None  Radiology No results found.  Procedures Procedures    Medications Ordered in ED Medications - No data to  display  ED Course/ Medical Decision Making/ A&P  COVID test is positive.  Patient to be treated with over-the-counter medications, rest, and return as needed.  Her vitals are stable with no hypoxia and she is clinically well-appearing.  I will prescribe molnupiravir.  Final Clinical Impression(s) / ED Diagnoses Final diagnoses:  None    Rx / DC Orders ED Discharge Orders     None         Veryl Speak, MD 11/12/21 [phone]

## 2021-11-19 ENCOUNTER — Encounter: Payer: Self-pay | Admitting: Internal Medicine

## 2021-12-16 ENCOUNTER — Other Ambulatory Visit (INDEPENDENT_AMBULATORY_CARE_PROVIDER_SITE_OTHER): Payer: Commercial Managed Care - HMO

## 2021-12-16 DIAGNOSIS — Z23 Encounter for immunization: Secondary | ICD-10-CM | POA: Diagnosis not present

## 2021-12-23 ENCOUNTER — Encounter: Payer: Self-pay | Admitting: Internal Medicine

## 2022-04-08 ENCOUNTER — Encounter: Payer: Self-pay | Admitting: Family Medicine

## 2022-04-08 ENCOUNTER — Other Ambulatory Visit: Payer: Self-pay | Admitting: *Deleted

## 2022-04-08 ENCOUNTER — Other Ambulatory Visit (INDEPENDENT_AMBULATORY_CARE_PROVIDER_SITE_OTHER): Payer: 59

## 2022-04-08 ENCOUNTER — Telehealth: Payer: 59 | Admitting: Family Medicine

## 2022-04-08 VITALS — Ht 68.0 in | Wt 271.0 lb

## 2022-04-08 DIAGNOSIS — R52 Pain, unspecified: Secondary | ICD-10-CM

## 2022-04-08 DIAGNOSIS — R0981 Nasal congestion: Secondary | ICD-10-CM

## 2022-04-08 DIAGNOSIS — J069 Acute upper respiratory infection, unspecified: Secondary | ICD-10-CM | POA: Diagnosis not present

## 2022-04-08 DIAGNOSIS — R6883 Chills (without fever): Secondary | ICD-10-CM

## 2022-04-08 LAB — POC COVID19 BINAXNOW: SARS Coronavirus 2 Ag: NEGATIVE

## 2022-04-08 LAB — POCT INFLUENZA A/B
Influenza A, POC: NEGATIVE
Influenza B, POC: NEGATIVE

## 2022-04-08 LAB — POCT RESPIRATORY SYNCYTIAL VIRUS: RSV Rapid Ag: NEGATIVE

## 2022-04-08 NOTE — Patient Instructions (Addendum)
Stay well hydrated--drink lots of water (aiming for very pale yellow urine). You can continue the tylenol cold medication, as directed on the package, if needed for sinus pain and congestion. If this isn't very effective, pseudoephedrine is a more effective decongestant than the phenylephrine that is in the over the counter medication you are taking (switch meds, don't take phenylephrine AND pseudoephedrine together). I encourage you to try sinus rinses to help with your sinus pain (neti-pot or sinus rinse kit). I recommend using guaifenesin to help loosen any thick mucus or phlegm (which can help with sinus and chest congestion). This can be found in Mucinex (I like to sue the 12 hour tablets twice daily), vs Robitussin (which needs to be used more often).   Your tests for flu, COVID and RSV were all negative. We discussed at your visit that this was a little early to guarantee that the negative COVID results are accurate.  I recommend repeating the test in another 1-2 days (and contact us if positive, to start Paxlovid--needs to be started within 5 days of your symptoms).  Follow up if you have persistent fevers (please get a thermometer), worsening cough, shortness of breath, chest pain, pain with breathing, asthma exacerbation, or other concerns.

## 2022-04-08 NOTE — Progress Notes (Signed)
Start time: 11:34 End time: 11:56  Virtual Visit via Video Note  I connected with Denise Schroeder on 04/08/22 by a video enabled telemedicine application and verified that I am speaking with the correct person using two identifiers.  Location: Patient: home Provider: office   I discussed the limitations of evaluation and management by telemedicine and the availability of in person appointments. The patient expressed understanding and agreed to proceed.  History of Present Illness:  Chief Complaint  Patient presents with   Chills    VIRTUAL symptoms started yesterday while at work head congestion with brown mucus. By the time she got home from work she had chills and body aches. Had been using her albuterol all last week, felt like her chest was hurting when she was breathing, Frederik Schmidt figured it was due to the cold weather. Has not done any home covid tests.    Yesterday she started feeling congested (nasal).  She coughed up some brown stuff yesterday morning.  She took an allergy medication (24 hour antihistamine) during the day, wasn't helpful.  Switched to Tylenol Cold after work, and that is helping some.  Main complaint is nasal congestion, runny nose.  Nasal drainage is yellow. She has pressure across the frontal and maxillary sinuses.  Tylenol Cold helps some with this pressure. Eyes are runny, achey all around her eyes. Some chills last night. She doesn't have a thermometer. Denies sore throat. Yesterday she had body aches, slight aching in her bones today.    Denies ear pain.  Only slight cough--gagging from PND. Not coughing last night. Denies flare of asthma, not needing inhaler.  She last used it Sunday, prior to respiratory symptoms.  She has had her flu shot, no COVID booster (she had COVID in September).  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 Just a few runny noses this week. No other sick contacts.  PMH, PSH, SH  reviewed   Outpatient Encounter Medications as of 04/08/2022  Medication Sig Note   albuterol (VENTOLIN HFA) 108 (90 Base) MCG/ACT inhaler Inhale 2 puffs into the lungs every 6 (six) hours as needed for wheezing or shortness of breath. 04/08/2022: Last used Sunday   BLACK CURRANT SEED OIL PO Take by mouth.    CALCIUM PO Take 2 tablets by mouth at bedtime.     Emollient (COLLAGEN EX) Apply topically.    levothyroxine (SYNTHROID) 100 MCG tablet TAKE 1 TABLET(100 MCG) BY MOUTH DAILY BEFORE BREAKFAST    Multiple Vitamin (MULTIVITAMIN WITH MINERALS) TABS Take 1 tablet by mouth every morning.     Pseudoeph-CPM-DM-APAP (TYLENOL COLD PO) Take 2 tablets by mouth as needed. 04/08/2022: Last dose this am 7:30   EPINEPHrine (EPIPEN 2-PAK) 0.3 mg/0.3 mL IJ SOAJ injection Inject 0.3 mg into the muscle as needed for anaphylaxis. (Patient not taking: Reported on 04/08/2022)    magnesium 30 MG tablet Take 30 mg by mouth daily. (Patient not taking: Reported on 04/08/2022) 04/08/2022: Ran out   triamcinolone cream (KENALOG) 0.1 % Apply 1 application. topically 2 (two) times daily. (Patient not taking: Reported on 04/08/2022) 04/08/2022: prn   [DISCONTINUED] Aluminum & Magnesium Hydroxide (MAGNESIUM-ALUMINUM PO) Take by mouth.    [DISCONTINUED] Semaglutide-Weight Management (WEGOVY) 0.25 MG/0.5ML SOAJ Inject 0.25 mg into the skin once a week.    [DISCONTINUED] Semaglutide-Weight Management (WEGOVY) 0.5 MG/0.5ML SOAJ Inject 0.5 mg into the skin once a week.    No facility-administered encounter medications on file as of 04/08/2022.   Allergies  Allergen  Reactions   Citrus Itching, Swelling and Cough    All fruits cause this reaction   Cashew Nut (Anacardium Occidentale) Skin Test    Latex Hives   Sesame Seed (Diagnostic)    ROS: See HPI.  Chills, URI complaints.  No GI complaints, shortness of breath, chest pain, rashes.    Observations/Objective:  Ht '5\' 8"'$  (1.727 m)   Wt 271 lb (122.9 kg)   BMI 41.21 kg/m    Tired-appearing female, in no distress. No coughing or sniffling during visit, no throat-clearing, Almost dozed off. Cranial nerves are grossly intact, normal speech. Exam is limited due to virtual nature of the visit.   POC tests negative for COVID, influenza A&B and RSV  Assessment and Plan:  Viral upper respiratory illness - negative for flu, COVID, RSV.  Supportive measures reviewed.  Prior to having results, discussed antiviral meds if + for flu or COVID  She was advised to recheck COVID test in 1-2d, due to today's antigen test being very early in course, could be a false negative. When she repeats test, she should contact us if +, for antiviral treatment (Paxlovid) given her risk factors.   She was also encouraged to get a thermometer.  Prior to her testing, we had discussed: If COVID, treat with paxlovid (day 0 would be 1/23, to isolate 1/24-28, etc) If flu, treat with tamiflu If negative for both--repeat COVID test at home in 1-2 days, contact us for paxlovid.  Pharmacy [facility]   Stay well hydrated--drink lots of water (aiming for very pale yellow urine). You can continue the tylenol cold medication, as directed on the package, if needed for sinus pain and congestion. If this isn't very effective, pseudoephedrine is a more effective decongestant than the phenylephrine that is in the over the counter medication you are taking (switch meds, don't take phenylephrine AND pseudoephedrine together). I encourage you to try sinus rinses to help with your sinus pain (neti-pot or sinus rinse kit). I recommend using guaifenesin to help loosen any thick mucus or phlegm (which can help with sinus and chest congestion). This can be found in Mucinex (I like to sue the 12 hour tablets twice daily), vs Robitussin (which needs to be used more often).   Your tests for flu, COVID and RSV were all negative. We discussed at your visit that this was a little early to guarantee that  the negative COVID results are accurate.  I recommend repeating the test in another 1-2 days (and contact us if positive, to start Paxlovid--needs to be started within 5 days of your symptoms).  Follow up if you have persistent fevers (please get a thermometer), worsening cough, shortness of breath, chest pain, pain with breathing, asthma exacerbation, or other concerns.     Follow Up Instructions:    I discussed the assessment and treatment plan with the patient. The patient was provided an opportunity to ask questions and all were answered. The patient agreed with the plan and demonstrated an understanding of the instructions.   The patient was advised to call back or seek an in-person evaluation if the symptoms worsen or if the condition fails to improve as anticipated.  I spent 25 minutes dedicated to the care of this patient, including pre-visit review of records, face to face time, post-visit ordering of testing and documentation.    Vikki Ports, MD

## 2022-04-14 ENCOUNTER — Telehealth: Payer: Self-pay | Admitting: Medical

## 2022-04-14 NOTE — Telephone Encounter (Signed)
Pt called, she is still having some cough at night mainly at night but her sinus cong and what she coughs up is dark  - green  ? Rx  Please call  Had virtual with Dr. Tomi Bamberger last week

## 2022-04-15 MED ORDER — AMOXICILLIN-POT CLAVULANATE 875-125 MG PO TABS: 1.0000 | ORAL_TABLET | Freq: Two times a day (BID) | ORAL | 0 refills | Status: DC

## 2022-04-15 NOTE — Telephone Encounter (Signed)
Pt informed rx sent in and Dr Johnsie Kindred instructions

## 2022-04-15 NOTE — Telephone Encounter (Signed)
Advise pt that I sent in a prescription for Augmentin to her [facility] to treat a sinus infection. She can continue with decongestants, sinus rinses and Mucinex as we discussed at her visit (and written on her AVS), along with the antibiotic.

## 2022-07-06 ENCOUNTER — Telehealth: Payer: Self-pay | Admitting: Medical

## 2022-07-06 MED ORDER — LEVOTHYROXINE SODIUM 100 MCG PO TABS: ORAL_TABLET | ORAL | 0 refills | Status: DC

## 2022-07-06 NOTE — Telephone Encounter (Signed)
refills  

## 2022-07-06 NOTE — Telephone Encounter (Signed)
Denise Schroeder needs a refill on her levothyroxine to   [facility] [id number] - [address] [place] - [address]  She has two pills left.

## 2022-07-21 ENCOUNTER — Emergency Department (HOSPITAL_BASED_OUTPATIENT_CLINIC_OR_DEPARTMENT_OTHER)
Admission: EM | Admit: 2022-07-21 | Discharge: 2022-07-22 | Disposition: A | Discharge status: Home / Self Care | Payer: Commercial Managed Care - HMO | Attending: Emergency Medicine | Admitting: Emergency Medicine

## 2022-07-21 ENCOUNTER — Other Ambulatory Visit: Payer: Self-pay

## 2022-07-21 ENCOUNTER — Emergency Department (HOSPITAL_BASED_OUTPATIENT_CLINIC_OR_DEPARTMENT_OTHER): Payer: Commercial Managed Care - HMO | Admitting: Radiology

## 2022-07-21 DIAGNOSIS — X501XXA Overexertion from prolonged static or awkward postures, initial encounter: Secondary | ICD-10-CM | POA: Diagnosis not present

## 2022-07-21 DIAGNOSIS — M25572 Pain in left ankle and joints of left foot: Secondary | ICD-10-CM | POA: Diagnosis not present

## 2022-07-21 DIAGNOSIS — J45909 Unspecified asthma, uncomplicated: Secondary | ICD-10-CM | POA: Insufficient documentation

## 2022-07-21 DIAGNOSIS — Z91040 Latex allergy status: Secondary | ICD-10-CM | POA: Insufficient documentation

## 2022-07-21 NOTE — ED Triage Notes (Signed)
[employer], broadcasting/film/video. Carry large bin and rolled ankle off a step. Difficulty bearing weight after with significant swelling noted. Pulses and skin intact.

## 2022-07-22 ENCOUNTER — Telehealth: Payer: Self-pay | Admitting: Internal Medicine

## 2022-07-22 NOTE — Discharge Instructions (Addendum)
You were seen in the ER today for evaluation of your left ankle pain after fall today. Your XR does not show any signs of fracture. I am placing you in a CAM boot for you to wear when you are up walking. You do not have to wear this while sleeping, showering, or resting. You can use the crutches as needed. I recommend 600mg  of ibuprofen and 1000mg  of Tylenol every 6 hours as needed for pain. I recommend that you follow up with the sports medicine provider listed on this discharge paperwork. If you have any concerns, new or worsening symptoms, please return to the nearest ER for re-evaluation.   Contact a health care provider if: Your pain gets worse. Your pain is not relieved with medicines. You have a fever or chills. You are having more trouble with walking. You have new symptoms. Get help right away if: Your foot, leg, toes, or ankle: Tingles or becomes numb. Becomes swollen. Turns pale or blue.

## 2022-07-22 NOTE — Transitions of Care (Post Inpatient/ED Visit) (Signed)
07/22/2022  Name: Denise Schroeder MRN: [id number] DOB: [birth year]  Today's TOC FU Call Status: Today's TOC FU Call Status:: Successful TOC FU Call Competed TOC FU Call Complete Date: 07/22/22  Transition Care Management Follow-up Telephone Call Date of Discharge: 07/21/22 Discharge Facility: [facility] ([facility]) Type of Discharge: Emergency Department Reason for ED Visit: Orthopedic Conditions Orthopedic/Injury Diagnosis: Sprain or Strain How have you been since you were released from the hospital?: Better Any questions or concerns?: No  Items Reviewed: Did you receive and understand the discharge instructions provided?: Yes Medications obtained,verified, and reconciled?: Yes (Medications Reviewed) Any new allergies since your discharge?: No Dietary orders reviewed?: No Do you have support at home?: Yes  Medications Reviewed Today: Medications Reviewed Today     Reviewed by Britt Boozer, CMA (Certified Medical Assistant) on 07/22/22 at 1541  Med List Status: <None>   Medication Order Taking? Sig Documenting Provider Last Dose Status Informant  albuterol (VENTOLIN HFA) 108 (90 Base) MCG/ACT inhaler [id number] Yes Inhale 2 puffs into the lungs every 6 (six) hours as needed for wheezing or shortness of breath. Tysinger, Kermit Balo, PA-C Taking Active            Med Note [place] Branch Apr 08, 2022 11:07 AM) Last used Sunday  BLACK CURRANT SEED OIL PO [id number] Yes Take by mouth. [provider] Taking Active   CALCIUM PO [id number] Yes Take 2 tablets by mouth at bedtime.  [provider] Taking Active Self  Emollient (COLLAGEN EX) [id number] Yes Apply topically. [provider] Taking Active   EPINEPHrine (EPIPEN 2-PAK) 0.3 mg/0.3 mL IJ SOAJ injection [id number] No Inject 0.3 mg into the muscle as needed for anaphylaxis.  Patient not taking: Reported on 04/08/2022   Jac Canavan, PA-C Not Taking Active   levothyroxine (SYNTHROID) 100  MCG tablet [id number] Yes TAKE 1 TABLET(100 MCG) BY MOUTH DAILY BEFORE BREAKFAST Jac Canavan, PA-C Taking Active   magnesium 30 MG tablet [id number] Yes Take 30 mg by mouth daily. [provider] Taking Active            Med Note Katrinka Blazing, Corliss Blacker Apr 08, 2022 11:08 AM) Ran out  Multiple Vitamin (MULTIVITAMIN WITH MINERALS) TABS [id number] Yes Take 1 tablet by mouth every morning.  [provider] Taking Active Self  triamcinolone cream (KENALOG) 0.1 % [id number] No Apply 1 application. topically 2 (two) times daily.  Patient not taking: Reported on 04/08/2022   Jac Canavan, PA-C Not Taking Active            Med Note Katrinka Blazing, Corliss Blacker Apr 08, 2022 11:09 AM) prn            Home Care and Equipment/Supplies: Were Home Health Services Ordered?: No Any new equipment or medical supplies ordered?: Yes (given boot and crutches) Were you able to get the equipment/medical supplies?: Yes Do you have any questions related to the use of the equipment/supplies?: No  Functional Questionnaire: Do you need assistance with bathing/showering or dressing?: No Do you need assistance with meal preparation?: No Do you need assistance with eating?: No Do you have difficulty maintaining continence: No Do you need assistance with getting out of bed/getting out of a chair/moving?: No Do you have difficulty managing or taking your medications?: No  Follow up appointments reviewed: PCP Follow-up appointment confirmed?: No MD Provider Line [phone] Given: No Specialist Hospital Follow-up appointment confirmed?: Yes Do you need transportation to  your follow-up appointment?: No Do you understand care options if your condition(s) worsen?: Yes-patient verbalized understanding    SIGNATURE Herminio Commons, CMA

## 2022-07-22 NOTE — ED Provider Notes (Signed)
[place] EMERGENCY DEPARTMENT AT [facility] Provider Note   CSN: [id number] Arrival date & time: 07/21/22  1902     History Chief Complaint  Patient presents with   Ankle Pain    Denise KESSELRING is a 57 y.o. female with h/o asthma presents to the ER for evaluation of left ankle pain. The patient reports that earlier today, she was stepping on the stage and inverted her ankle. She has more pain on the lateral aspect of her ankle. No pain medication taken prior to arrival. Denies any numbness or tingling in the foot, ankle or leg. Denies any other injury. She was able to walk on it afterwards, but did have pain.    Ankle Pain Associated symptoms: no back pain and no fever        Home Medications Prior to Admission medications   Medication Sig Start Date End Date Taking? Authorizing Provider  albuterol (VENTOLIN HFA) 108 (90 Base) MCG/ACT inhaler Inhale 2 puffs into the lungs every 6 (six) hours as needed for wheezing or shortness of breath. 08/06/21   Tysinger, Kermit Balo, PA-C  amoxicillin-clavulanate (AUGMENTIN) 875-125 MG tablet Take 1 tablet by mouth 2 (two) times daily. 04/15/22   Joselyn Arrow, MD  BLACK CURRANT SEED OIL PO Take by mouth.    [provider]  CALCIUM PO Take 2 tablets by mouth at bedtime.     [provider]  Emollient (COLLAGEN EX) Apply topically.    [provider]  EPINEPHrine (EPIPEN 2-PAK) 0.3 mg/0.3 mL IJ SOAJ injection Inject 0.3 mg into the muscle as needed for anaphylaxis. Patient not taking: Reported on 04/08/2022 08/06/21   Jac Canavan, PA-C  levothyroxine (SYNTHROID) 100 MCG tablet TAKE 1 TABLET(100 MCG) BY MOUTH DAILY BEFORE BREAKFAST 07/06/22   Tysinger, Kermit Balo, PA-C  magnesium 30 MG tablet Take 30 mg by mouth daily. Patient not taking: Reported on 04/08/2022    [provider]  Multiple Vitamin (MULTIVITAMIN WITH MINERALS) TABS Take 1 tablet by mouth every morning.     [provider]   Pseudoeph-CPM-DM-APAP (TYLENOL COLD PO) Take 2 tablets by mouth as needed.    [provider]  triamcinolone cream (KENALOG) 0.1 % Apply 1 application. topically 2 (two) times daily. Patient not taking: Reported on 04/08/2022 08/06/21   Tysinger, Kermit Balo, PA-C      Allergies    Citrus, Cashew nut (anacardium occidentale) skin test, Latex, and Sesame seed (diagnostic)    Review of Systems   Review of Systems  Constitutional:  Negative for fever.  Musculoskeletal:  Positive for arthralgias. Negative for back pain.    Physical Exam Updated Vital Signs BP 139/71   Pulse 68   Temp 98.3 F (36.8 C)   Resp 18   Ht 5\' 8"  (1.727 m)   Wt 122.5 kg   SpO2 100%   BMI 41.05 kg/m  Physical Exam Vitals and nursing note reviewed.  Constitutional:      General: She is not in acute distress.    Appearance: Normal appearance. She is not ill-appearing or toxic-appearing.  Eyes:     General: No scleral icterus. Pulmonary:     Effort: Pulmonary effort is normal. No respiratory distress.  Musculoskeletal:        General: Tenderness present.       Legs:     Comments: Tenderness just anterior and tenderness inferior to the left lateral malleolus. Some swelling noted. No obvious deformity. Reported sensation is intact bilaterally. Palpable DP  and PT pulses. Compartments are soft. Very mild tenderness just superior to the ankle. No obvious swelling. Cap refill brisk. Full dorsal and pedal flexion present with more pain on dorsi flexion. No tenderness to the medial malleolus.   Skin:    General: Skin is dry.     Findings: No rash.  Neurological:     General: No focal deficit present.     Mental Status: She is alert. Mental status is at baseline.     ED Results / Procedures / Treatments   Labs (all labs ordered are listed, but only abnormal results are displayed) Labs Reviewed - No data to display  EKG None  Radiology DG Ankle Complete Left  Result Date: 07/21/2022 CLINICAL  DATA:  Fall and trauma to the left ankle. EXAM: LEFT ANKLE COMPLETE - 3+ VIEW COMPARISON:  None Available. FINDINGS: There is no acute fracture or dislocation. The bones are osteopenic. The ankle mortise is intact. Mild soft tissue swelling over the lateral malleolus. IMPRESSION: 1. No acute fracture or dislocation. 2. Osteopenia. Electronically Signed   By: Elgie Collard M.D.   On: 07/21/2022 21:05    Procedures Procedures   Medications Ordered in ED Medications - No data to display  ED Course/ Medical Decision Making/ A&P                            Medical Decision Making Amount and/or Complexity of Data Reviewed Radiology: ordered.   57 y.o. female presents to the ER today for evaluation of left ankle pain. Differential diagnosis includes but is not limited to sprain, strain, fracture, dislocation, tendon ligament injury. Vital signs unremarkable. Physical exam as noted above.   XR imaging shows 1. No acute fracture or dislocation. 2. Osteopenia.   No obvious deformity noted at the left ankle.  Likely ankle sprain or strain.  Will place in a cam boot for comfort.  Patient is ambulatory but does have some pain.  Will give crutches for assistance as well.  Follow-up with sports medicine provided.  Overall, her exam is reassuring with negative x-ray imaging, she is safe for discharge home.  We discussed cam boot as well as RICE method. Discussed the need for follow up with sport medicine. Discussed pain management as well.   We discussed plan at bedside. We discussed strict return precautions and red flag symptoms. The patient verbalized their understanding and agrees to the plan. The patient is stable and being discharged home in good condition.  Portions of this report may have been transcribed using voice recognition software. Every effort was made to ensure accuracy; however, inadvertent computerized transcription errors may be present.   Final Clinical Impression(s) / ED  Diagnoses Final diagnoses:  Acute left ankle pain    Rx / DC Orders ED Discharge Orders     None         Achille Rich, PA-C 07/22/22 1329    Ernie Avena, MD 07/23/22 1552

## 2022-07-22 NOTE — ED Notes (Signed)
DC papers reviewed. No questions or concerns. No signs of distress. Pt assisted to wheelchair and out to lobby. Appropriate measures for safety taken. 

## 2022-08-11 ENCOUNTER — Encounter: Payer: Self-pay | Admitting: Medical

## 2022-08-11 ENCOUNTER — Ambulatory Visit (INDEPENDENT_AMBULATORY_CARE_PROVIDER_SITE_OTHER): Payer: Commercial Managed Care - HMO | Admitting: Medical

## 2022-08-11 VITALS — BP 120/70 | HR 70 | Ht 68.0 in | Wt 274.8 lb

## 2022-08-11 DIAGNOSIS — E039 Hypothyroidism, unspecified: Secondary | ICD-10-CM | POA: Diagnosis not present

## 2022-08-11 DIAGNOSIS — M8580 Other specified disorders of bone density and structure, unspecified site: Secondary | ICD-10-CM | POA: Insufficient documentation

## 2022-08-11 DIAGNOSIS — Z7185 Encounter for immunization safety counseling: Secondary | ICD-10-CM | POA: Diagnosis not present

## 2022-08-11 DIAGNOSIS — Z131 Encounter for screening for diabetes mellitus: Secondary | ICD-10-CM

## 2022-08-11 DIAGNOSIS — Z0000 Encounter for general adult medical examination without abnormal findings: Secondary | ICD-10-CM

## 2022-08-11 DIAGNOSIS — J45909 Unspecified asthma, uncomplicated: Secondary | ICD-10-CM

## 2022-08-11 DIAGNOSIS — Z13220 Encounter for screening for lipoid disorders: Secondary | ICD-10-CM | POA: Insufficient documentation

## 2022-08-11 DIAGNOSIS — M25572 Pain in left ankle and joints of left foot: Secondary | ICD-10-CM

## 2022-08-11 DIAGNOSIS — Z780 Asymptomatic menopausal state: Secondary | ICD-10-CM | POA: Insufficient documentation

## 2022-08-11 LAB — POCT URINALYSIS DIP (PROADVANTAGE DEVICE)
Bilirubin, UA: NEGATIVE
Blood, UA: NEGATIVE
Glucose, UA: NEGATIVE mg/dL
Ketones, POC UA: NEGATIVE mg/dL
Leukocytes, UA: NEGATIVE
Nitrite, UA: NEGATIVE
Protein Ur, POC: NEGATIVE mg/dL
Specific Gravity, Urine: 1.015
Urobilinogen, Ur: 0.2
pH, UA: 6 (ref 5.0–8.0)

## 2022-08-11 MED ORDER — EPINEPHRINE 0.3 MG/0.3ML IJ SOAJ
0.3000 mg | INTRAMUSCULAR | 1 refills | Status: DC | PRN
Start: 2022-08-11 — End: 2022-08-18

## 2022-08-11 MED ORDER — LEVOTHYROXINE SODIUM 100 MCG PO TABS
ORAL_TABLET | ORAL | 1 refills | Status: DC
Start: 2022-08-11 — End: 2022-10-13

## 2022-08-11 MED ORDER — ALBUTEROL SULFATE HFA 108 (90 BASE) MCG/ACT IN AERS: 2.0000 | INHALATION_SPRAY | Freq: Four times a day (QID) | RESPIRATORY_TRACT | 1 refills | Status: DC | PRN

## 2022-08-11 MED ORDER — TRIAMCINOLONE ACETONIDE 0.1 % EX CREA: 1.0000 | TOPICAL_CREAM | Freq: Two times a day (BID) | CUTANEOUS | 1 refills | Status: AC

## 2022-08-11 NOTE — Progress Notes (Signed)
Complete physical exam  Patient: Denise Schroeder   DOB: 1965-12-27   57 y.o. Female  MRN: [id number]  Subjective:    Chief Complaint  Patient presents with   Annual Exam    Fasting annual exam, urine given for UA. No concerns, just wants labs done. Fell 3 weeks ago and sprained left ankle.     Denise Schroeder is a 57 y.o. female who presents today for a complete physical exam.   Larey Seat about 3 weeks ago, tripped on a small riser, fell.  Since then still has left ankle pain and swelling.  Is limping a little but is able to weight bear ok.  Was seen at ED, had xray, no fracture.   Mother passed away 2022/01/17, and had an employee and a friend pass in recent months  Mother had breast cancer.   Most recent fall risk assessment:    08/11/2022    9:46 AM  Fall Risk   Falls in the past year? 1  Number falls in past yr: 0  Comment 07/21/22  Injury with Fall? 1  Comment sprained L ankle  Risk for fall due to : History of fall(s)  Follow up Falls evaluation completed     Most recent depression screenings:    08/11/2022    9:46 AM 08/06/2021   11:23 AM  PHQ 2/9 Scores  PHQ - 2 Score 0 0     Patient Care Team: Rooney Gladwin, Kermit Balo, PA-C as PCP - General (Family Medicine) Jodelle Red, MD as PCP - Cardiology (Cardiology)  Dr. Maxie Better, gyn   Outpatient Medications Prior to Visit  Medication Sig Note   BLACK CURRANT SEED OIL PO Take by mouth.    CALCIUM PO Take 2 tablets by mouth at bedtime.     cyanocobalamin (VITAMIN B12) 1000 MCG tablet Take 1,000 mcg by mouth daily.    Emollient (COLLAGEN EX) Apply topically.    lactobacillus acidophilus (BACID) TABS tablet Take 2 tablets by mouth 3 (three) times daily.    magnesium 30 MG tablet Take 30 mg by mouth daily.    Multiple Vitamin (MULTIVITAMIN WITH MINERALS) TABS Take 1 tablet by mouth every morning.     [DISCONTINUED] levothyroxine (SYNTHROID) 100 MCG tablet TAKE 1 TABLET(100 MCG) BY MOUTH DAILY BEFORE  BREAKFAST    [DISCONTINUED] albuterol (VENTOLIN HFA) 108 (90 Base) MCG/ACT inhaler Inhale 2 puffs into the lungs every 6 (six) hours as needed for wheezing or shortness of breath. (Patient not taking: Reported on 08/11/2022) 08/11/2022: As needed   [DISCONTINUED] EPINEPHrine (EPIPEN 2-PAK) 0.3 mg/0.3 mL IJ SOAJ injection Inject 0.3 mg into the muscle as needed for anaphylaxis. (Patient not taking: Reported on 04/08/2022) 08/11/2022: As needed   [DISCONTINUED] triamcinolone cream (KENALOG) 0.1 % Apply 1 application. topically 2 (two) times daily. (Patient not taking: Reported on 04/08/2022)    No facility-administered medications prior to visit.    Review of Systems  Constitutional:  Negative for chills, fever, malaise/fatigue and weight loss.  HENT:  Negative for congestion, ear pain, hearing loss, sore throat and tinnitus.   Eyes:  Negative for blurred vision, pain and redness.  Respiratory:  Negative for cough, hemoptysis and shortness of breath.   Cardiovascular:  Negative for chest pain, palpitations, orthopnea, claudication and leg swelling.  Gastrointestinal:  Negative for abdominal pain, blood in stool, constipation, diarrhea, nausea and vomiting.  Genitourinary:  Negative for dysuria, flank pain, frequency, hematuria and urgency.  Musculoskeletal:  Positive for joint pain. Negative for falls  and myalgias.  Skin:  Negative for itching and rash.  Neurological:  Negative for dizziness, tingling, speech change, weakness and headaches.  Endo/Heme/Allergies:  Negative for polydipsia. Does not bruise/bleed easily.  Psychiatric/Behavioral:  Negative for depression and memory loss. The patient is not nervous/anxious and does not have insomnia.        Objective:     BP 120/70   Pulse 70   Ht 5\' 8"  (1.727 m)   Wt 274 lb 12.8 oz (124.6 kg)   SpO2 98%   BMI 41.78 kg/m   Physical Exam Vitals and nursing note reviewed.  Constitutional:      General: She is not in acute distress.     Appearance: Normal appearance. She is not ill-appearing.  HENT:     Head: Normocephalic and atraumatic.     Right Ear: External ear normal.     Left Ear: External ear normal.     Nose: Nose normal.     Mouth/Throat:     Mouth: Mucous membranes are moist.     Pharynx: Oropharynx is clear.  Eyes:     Extraocular Movements: Extraocular movements intact.     Conjunctiva/sclera: Conjunctivae normal.     Pupils: Pupils are equal, round, and reactive to light.  Neck:     Vascular: No carotid bruit.  Cardiovascular:     Rate and Rhythm: Normal rate and regular rhythm.     Pulses: Normal pulses.     Heart sounds: Normal heart sounds. No murmur heard. Pulmonary:     Effort: Pulmonary effort is normal. No respiratory distress.     Breath sounds: Normal breath sounds. No wheezing or rales.  Abdominal:     General: Bowel sounds are normal. There is no distension.     Palpations: Abdomen is soft. There is no mass.     Tenderness: There is no abdominal tenderness.     Hernia: No hernia is present.  Musculoskeletal:        General: Swelling and tenderness present. No deformity. Normal range of motion.     Cervical back: Normal range of motion and neck supple. No tenderness.     Right lower leg: No edema.     Left lower leg: No edema.     Comments: Tender left lateral malleolus and along lateral ligaments, but no swelling, no bruising, normal ROM  Lymphadenopathy:     Cervical: No cervical adenopathy.  Skin:    General: Skin is warm and dry.     Capillary Refill: Capillary refill takes less than 2 seconds.     Findings: No rash.  Neurological:     Mental Status: She is alert and oriented to person, place, and time. Mental status is at baseline.     Cranial Nerves: No cranial nerve deficit.     Sensory: No sensory deficit.     Motor: No weakness.     Gait: Gait normal.     Deep Tendon Reflexes: Reflexes normal.  Psychiatric:        Mood and Affect: Mood normal.        Behavior: Behavior  normal.        Judgment: Judgment normal.          Assessment & Plan:    Routine Health Maintenance and Physical Exam  Immunization History  Administered Date(s) Administered   Influenza,inj,Quad PF,6+ Mos 11/07/2014, 11/10/2016, 11/10/2017, 11/29/2018, 12/19/2019, 01/06/2021, 12/16/2021   Influenza-Unspecified 11/28/2015   PFIZER(Purple Top)SARS-COV-2 Vaccination 06/12/2019, 07/05/2019, 03/06/2020   PPD Test  09/25/2003, 03/13/2011   Td 01/08/1999   Tdap 11/07/2014    Health Maintenance  Topic Date Due   INFLUENZA VACCINE  10/15/2022   MAMMOGRAM  08/27/2023   Colonoscopy  05/02/2024   PAP SMEAR-Modifier  08/25/2024   DTaP/Tdap/Td (3 - Td or Tdap) 11/06/2024   Hepatitis C Screening  Completed   HIV Screening  Completed   HPV VACCINES  Aged Out   COVID-19 Vaccine  Discontinued   Zoster Vaccines- Shingrix  Discontinued   Return at your convenience for Shingrix  Please call to schedule your bone density test  [facility]  [phone] [address] [place], [place] [address]  Ankle pain, left - discussed home strengthening and rehab exercises as we discussed.   Review recent xray that showed no fracture but does show osteopenia.  Advised cool therapy such as bucket of cold water BID, Aleve daily for the next week.  ACE wrap applied today.  Follow up in 2 weeks if not improving.  Discussed health benefits of physical activity, and encouraged her to engage in regular exercise appropriate for her age and condition.  Problem List Items Addressed This Visit     Hypothyroidism    Labs today, continue routine medication      Relevant Medications   levothyroxine (SYNTHROID) 100 MCG tablet   Other Relevant Orders   TSH   Screening for diabetes mellitus   Relevant Orders   Hemoglobin A1c   Vaccine counseling   Encounter for health maintenance examination in adult - Primary   Relevant Orders   Comprehensive metabolic panel    CBC   Lipid panel   TSH   Hemoglobin A1c   POCT Urinalysis DIP (Proadvantage Device) (Completed)   DG Bone Density   Fecal occult blood, imunochemical([facility])   Asthma due to environmental allergies    No recent issues      Relevant Medications   albuterol (VENTOLIN HFA) 108 (90 Base) MCG/ACT inhaler   Osteopenia determined by x-ray   Relevant Orders   DG Bone Density   Postmenopausal estrogen deficiency   Relevant Orders   DG Bone Density   Screening for lipid disorders   Relevant Orders   Lipid panel   Other Visit Diagnoses     Acute left ankle pain          Return for CPX physical fasting.     Kristian Covey, PA-C

## 2022-08-11 NOTE — Patient Instructions (Signed)
Come in for Shingrix vaccine at your convenience, 2 shots 2 months aparat  Please call to schedule your bone density test.   [facility]  [phone] [address], [address] [place], [place] [address]

## 2022-08-11 NOTE — Assessment & Plan Note (Signed)
No recent issues

## 2022-08-11 NOTE — Assessment & Plan Note (Signed)
Labs today, continue routine medication

## 2022-08-12 ENCOUNTER — Other Ambulatory Visit: Payer: Self-pay | Admitting: Medical

## 2022-08-12 LAB — COMPREHENSIVE METABOLIC PANEL
ALT: 16 IU/L (ref 0–32)
AST: 17 IU/L (ref 0–40)
Albumin/Globulin Ratio: 1.6 (ref 1.2–2.2)
Albumin: 4.4 g/dL (ref 3.8–4.9)
Alkaline Phosphatase: 98 IU/L (ref 44–121)
BUN/Creatinine Ratio: 16 (ref 9–23)
BUN: 14 mg/dL (ref 6–24)
Bilirubin Total: 0.4 mg/dL (ref 0.0–1.2)
CO2: 21 mmol/L (ref 20–29)
Calcium: 10.1 mg/dL (ref 8.7–10.2)
Chloride: 104 mmol/L (ref 96–106)
Creatinine, Ser: 0.89 mg/dL (ref 0.57–1.00)
Globulin, Total: 2.8 g/dL (ref 1.5–4.5)
Glucose: 83 mg/dL (ref 70–99)
Potassium: 4.3 mmol/L (ref 3.5–5.2)
Sodium: 140 mmol/L (ref 134–144)
Total Protein: 7.2 g/dL (ref 6.0–8.5)
eGFR: 76 mL/min/{1.73_m2} (ref >59)

## 2022-08-12 LAB — LIPID PANEL
Chol/HDL Ratio: 3.3 ratio (ref 0.0–4.4)
Cholesterol, Total: 211 mg/dL — ABNORMAL HIGH (ref 100–199)
HDL: 63 mg/dL (ref >39)
LDL Chol Calc (NIH): 128 mg/dL — ABNORMAL HIGH (ref 0–99)
Triglycerides: 113 mg/dL (ref 0–149)
VLDL Cholesterol Cal: 20 mg/dL (ref 5–40)

## 2022-08-12 LAB — TSH: TSH: 3.1 u[IU]/mL (ref 0.450–4.500)

## 2022-08-12 LAB — HEMOGLOBIN A1C
Est. average glucose Bld gHb Est-mCnc: 108 mg/dL
Hgb A1c MFr Bld: 5.4 % (ref 4.8–5.6)

## 2022-08-12 LAB — CBC
Hematocrit: 38.4 % (ref 34.0–46.6)
Hemoglobin: 12.2 g/dL (ref 11.1–15.9)
MCH: 27.5 pg (ref 26.6–33.0)
MCHC: 31.8 g/dL (ref 31.5–35.7)
MCV: 87 fL (ref 79–97)
Platelets: 242 10*3/uL (ref 150–450)
RBC: 4.44 x10E6/uL (ref 3.77–5.28)
RDW: 13.1 % (ref 11.7–15.4)
WBC: 5.9 10*3/uL (ref 3.4–10.8)

## 2022-08-12 NOTE — Progress Notes (Signed)
Results sent through MyChart

## 2022-08-18 ENCOUNTER — Telehealth: Payer: Self-pay | Admitting: Internal Medicine

## 2022-08-18 MED ORDER — EPINEPHRINE 0.3 MG/0.3ML IJ SOAJ: 0.3000 mg | INTRAMUSCULAR | 1 refills | Status: AC | PRN

## 2022-08-18 NOTE — Telephone Encounter (Signed)
Pt needs a refill of epipen to [facility] as her insurane will not cover epipen at [facility]

## 2022-08-21 ENCOUNTER — Other Ambulatory Visit: Payer: Self-pay | Admitting: Medical

## 2022-09-02 LAB — HM MAMMOGRAPHY

## 2022-10-13 ENCOUNTER — Telehealth: Payer: Self-pay | Admitting: Medical

## 2022-10-13 MED ORDER — LEVOTHYROXINE SODIUM 100 MCG PO TABS: ORAL_TABLET | ORAL | 1 refills | Status: DC

## 2022-10-13 NOTE — Telephone Encounter (Signed)
Needs refill on synthroid  ( took last one yesterday)   Editor, commissioning

## 2022-10-13 NOTE — Telephone Encounter (Signed)
done

## 2022-10-28 IMAGING — CT CT CARDIAC CORONARY ARTERY CALCIUM SCORE
3 series · 14 of 20 positions shown, 16 images · non-contrast
Comparison: None.

CLINICAL DATA: 55-year-old African American female with family
history of heart disease.

EXAM:
CT CARDIAC CORONARY ARTERY CALCIUM SCORE
TECHNIQUE: Non-contrast imaging through the heart was performed using
prospective ECG gating. Image post processing was performed on an
independent workstation, allowing for quantitative analysis of the
heart and coronary arteries. Note that this exam targets the heart
and the chest was not imaged in its entirety.

[Series 2: calcium scoring 2.00 qr36 bestdiast 71% hrt calciu · axial · 0.46mm/px · z∈[+1789,+1861]mm · 4 of 60 slices shown]
[im 12/60  vessel]
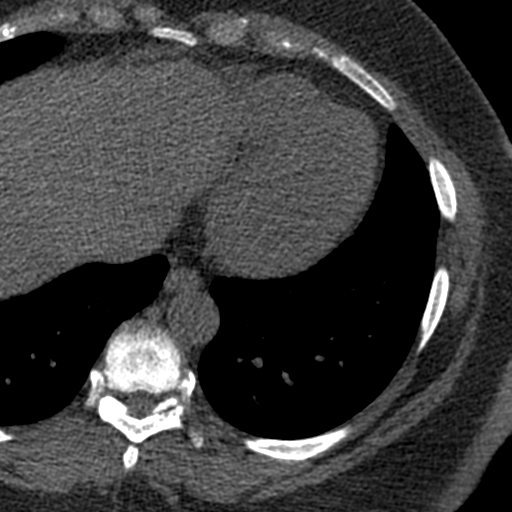
[im 24/60  vessel]
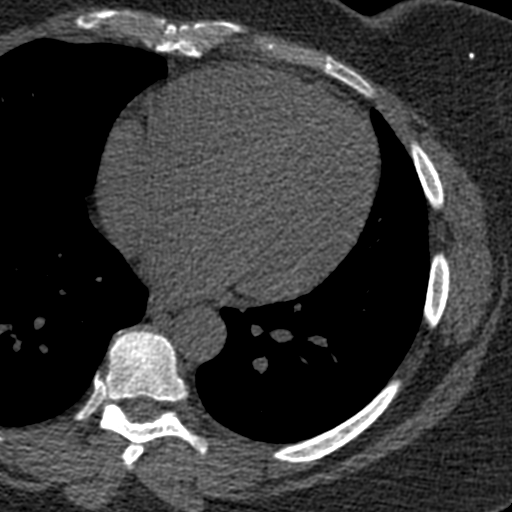
[im 36/60  vessel]
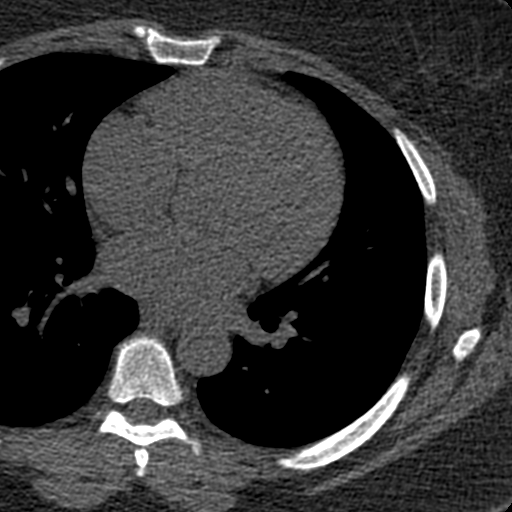
[im 48/60  vessel]
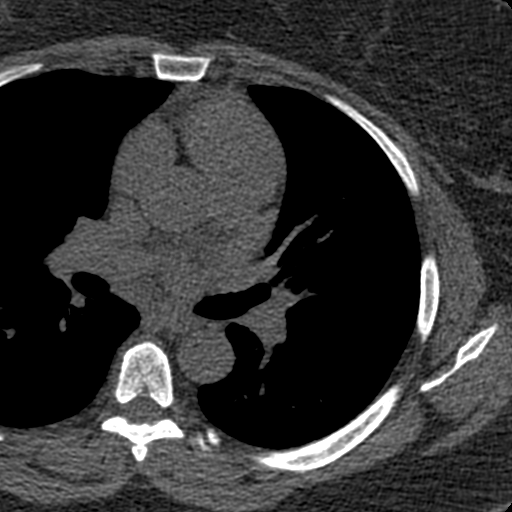

[Series 3: calcium scoring 2.00 br40 bestdiast 71% axial · axial · 0.68mm/px · z∈[+1785,+1865]mm · 5 of 60 slices shown, 7 images]
[im 10/60  vessel]
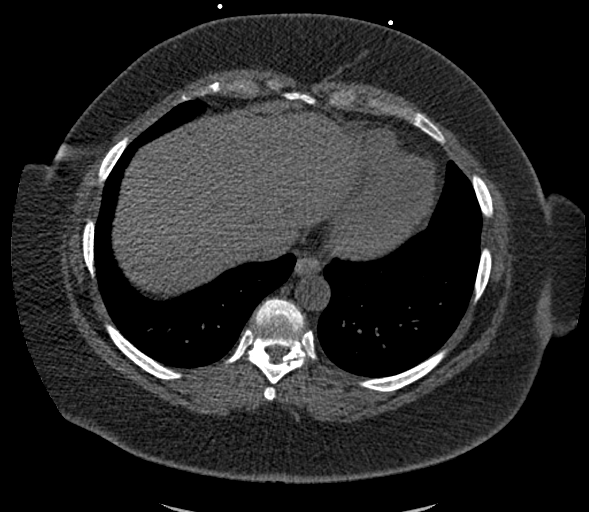
[im 10/60  lung]
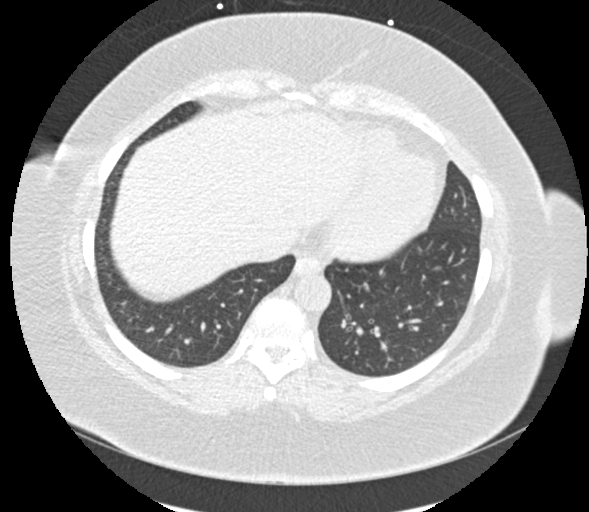
[im 20/60  vessel]
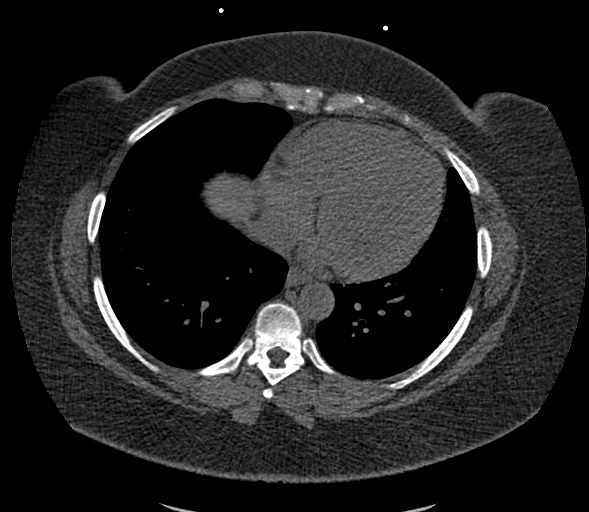
[im 30/60  vessel]
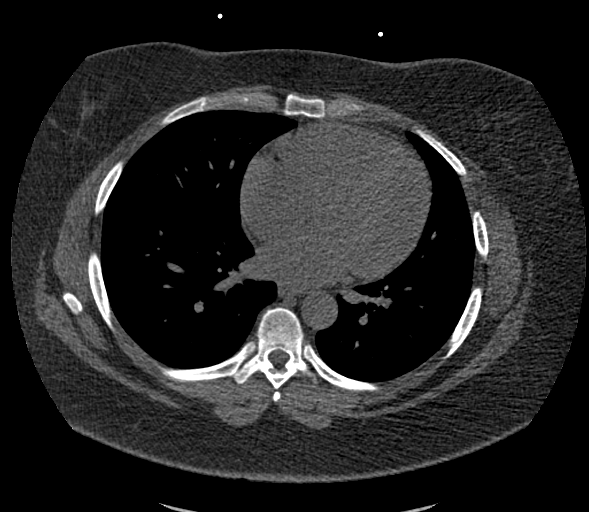
[im 40/60  vessel]
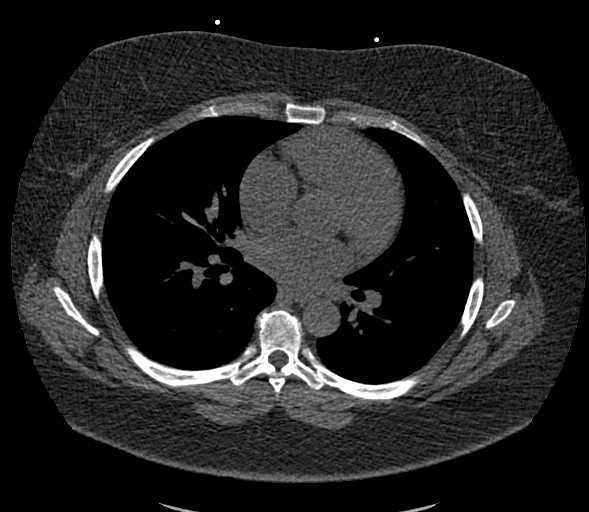
[im 50/60  vessel]
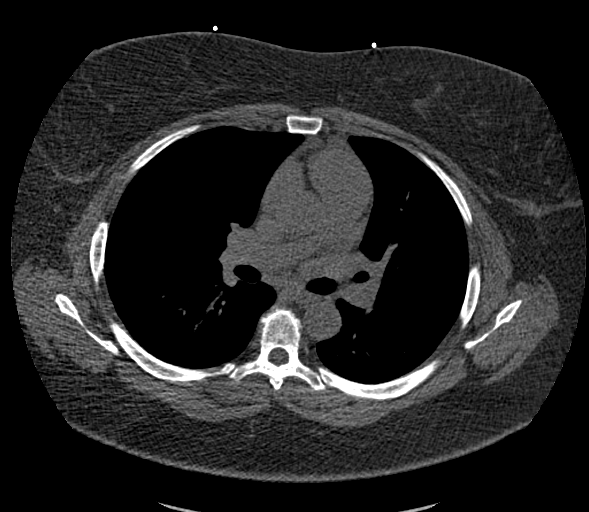
[im 50/60  lung]
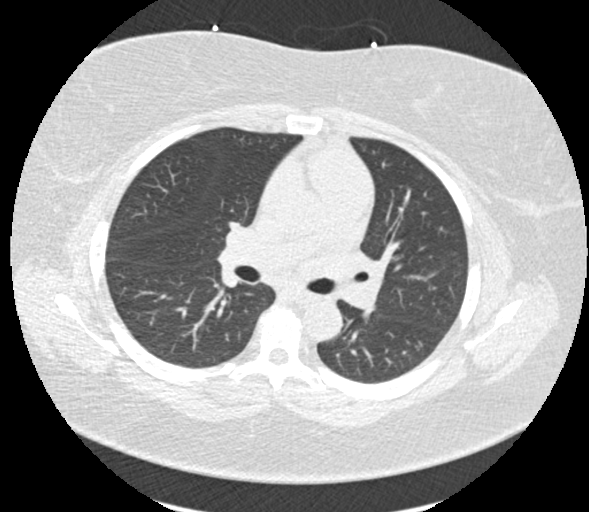

[Series 9: calcium scoring 2.00 br60 bestdiast 71% lungs · axial · 0.68mm/px · z∈[+1785,+1865]mm · 5 of 60 slices shown]
[im 10/60  vessel]
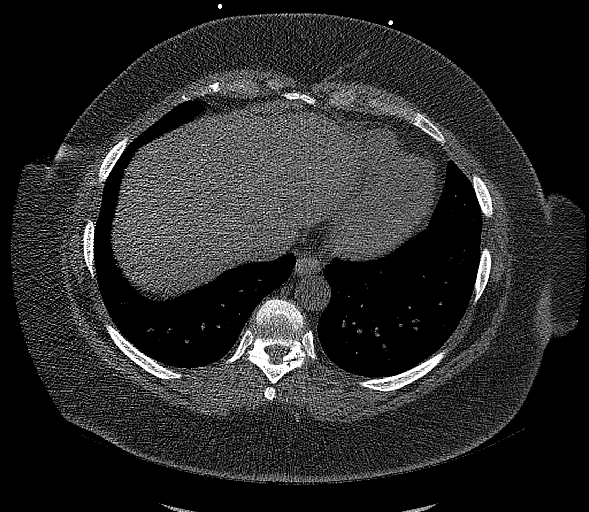
[im 20/60  vessel]
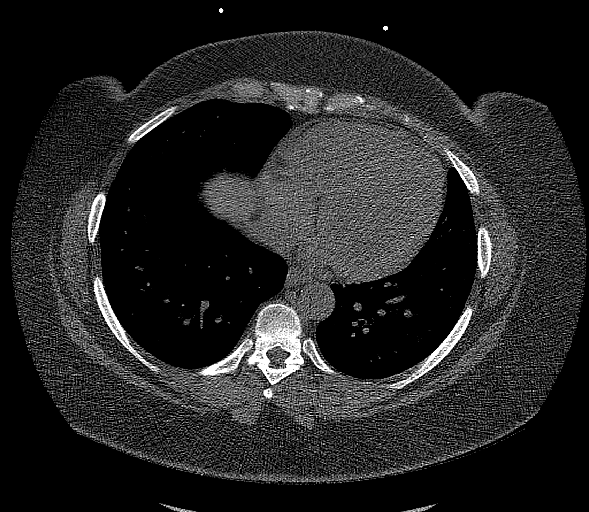
[im 30/60  vessel]
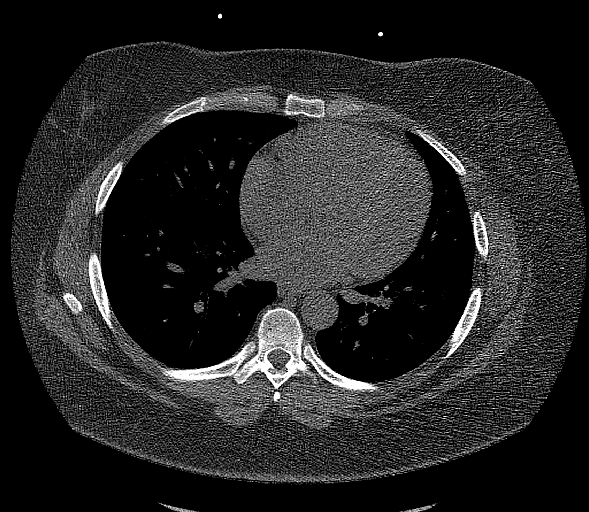
[im 40/60  vessel]
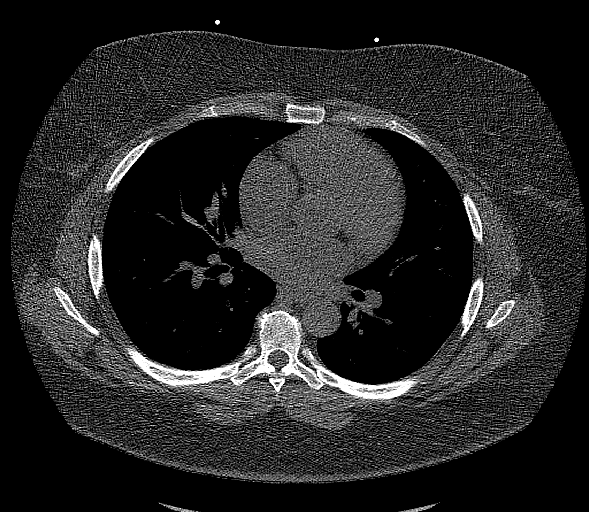
[im 50/60  vessel]
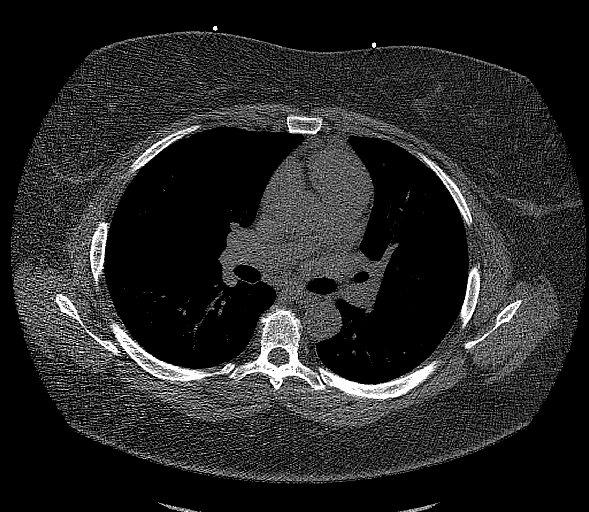

[14 of 20 positions shown; findings below may reference images not displayed]

FINDINGS: CORONARY CALCIUM SCORES:

Left Main: 0

LAD: 8

LCx:

RCA:

Total Agatston Score: 28

[HOSPITAL] percentile: 89

AORTA MEASUREMENTS:

Ascending Aorta: 31 mm

Descending Aorta: 24 mm

OTHER FINDINGS:

The heart size is within normal limits. No pericardial fluid is
identified. Visualized segments of the thoracic aorta and central
pulmonary arteries are normal in caliber. Visualized mediastinum and
hilar regions demonstrate no lymphadenopathy or masses. Small
calcified granuloma present at the posterior left lung base.
Visualized lungs show no evidence of pulmonary edema, consolidation,
pneumothorax or pleural fluid. Visualized upper abdomen and bony
structures are unremarkable.
IMPRESSION: 1. Coronary calcium score of 28 is at the 89th percentile for the
patient's age, sex and race.
2. Small calcified granuloma at the left lung base.

## 2022-11-26 ENCOUNTER — Other Ambulatory Visit: Payer: Self-pay | Admitting: Medical

## 2022-11-26 ENCOUNTER — Telehealth: Payer: Self-pay | Admitting: Medical

## 2022-11-26 DIAGNOSIS — J45909 Unspecified asthma, uncomplicated: Secondary | ICD-10-CM

## 2022-11-26 MED ORDER — FLUTICASONE FUROATE-VILANTEROL 100-25 MCG/ACT IN AEPB: 1.0000 | INHALATION_SPRAY | Freq: Every day | RESPIRATORY_TRACT | 0 refills | Status: DC

## 2022-11-26 MED ORDER — ALBUTEROL SULFATE HFA 108 (90 BASE) MCG/ACT IN AERS: 2.0000 | INHALATION_SPRAY | Freq: Four times a day (QID) | RESPIRATORY_TRACT | 1 refills | Status: DC | PRN

## 2022-11-26 NOTE — Telephone Encounter (Signed)
Pt called requesting rx for inhaler  she has been using her albuterol inhaler a lot lately, she has already used 3-4 times today. She had another inhaler but can't remember the name  and is requesting rx for that inhaler   Please call pt

## 2022-11-26 NOTE — Telephone Encounter (Signed)
Pt was notified and will call back to schedule a visit

## 2022-11-26 NOTE — Telephone Encounter (Signed)
I do not see any notes about another inhaler being used.

## 2022-11-26 NOTE — Telephone Encounter (Signed)
Fax from [facility] Rx Flutic/vilan 100-25 mcg  Drug not covered under plan  the preferred alternative is [employer]

## 2022-12-16 ENCOUNTER — Ambulatory Visit: Payer: Managed Care, Other (non HMO) | Attending: Medical

## 2022-12-16 ENCOUNTER — Ambulatory Visit (INDEPENDENT_AMBULATORY_CARE_PROVIDER_SITE_OTHER): Payer: Managed Care, Other (non HMO) | Admitting: Medical

## 2022-12-16 ENCOUNTER — Encounter: Payer: Self-pay | Admitting: Medical

## 2022-12-16 VITALS — BP 122/70 | HR 64 | Wt 271.6 lb

## 2022-12-16 DIAGNOSIS — R55 Syncope and collapse: Secondary | ICD-10-CM

## 2022-12-16 DIAGNOSIS — R42 Dizziness and giddiness: Secondary | ICD-10-CM | POA: Diagnosis not present

## 2022-12-16 NOTE — Progress Notes (Signed)
Subjective:  Denise Schroeder is a 57 y.o. female who presents for Chief Complaint  Patient presents with   Dizziness    Dizziness today and to call EMS and had EKG done today and everything was fine but was told to follow-up with pcp.      Here for c/o faint or dizziness.  Gets these episodes every now and then.  She had an episode in August where she was at church and all of a sudden started feeling not well dizzy and then had a faint episode lasting for several seconds.  She rested for a little while with the help of others and finally felt okay.  Today called 911 as she didn't feel right.  EMS came out, did EKG, no obvious issues.  Advised she contact PCP.  When EMS came out reportedly EKG was normal, blood sugar and blood pressure was normal.  She did feel unusual before and felt, tired after the event.  This event was witnessed today.  No seizure activity noted.  She denies chest pain, edema.  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  She also is under some stress and that her mother passed away in the past year  She wonders if some of this is anxiety related  She denies snoring or obvious apnea, but does not always wake rested  She has a smart watch on today but says is not actually working.  No other aggravating or relieving factors.    No other c/o.  Past Medical History:  Diagnosis Date   Allergy    Anemia    GERD (gastroesophageal reflux disease)    Goiter    Hashimoto thyroiditis    Obesity    Uterine fibroid    Current Outpatient Medications on File Prior to Visit  Medication Sig Dispense Refill   albuterol (VENTOLIN HFA) 108 (90 Base) MCG/ACT inhaler Inhale 2 puffs into the lungs every 6 (six) hours as needed for wheezing or shortness of  breath. 18 g 1   BLACK CURRANT SEED OIL PO Take by mouth.     CALCIUM PO Take 2 tablets by mouth at bedtime.      cyanocobalamin (VITAMIN B12) 1000 MCG tablet Take 1,000 mcg by mouth daily.     Emollient (COLLAGEN EX) Apply topically.     EPINEPHrine (EPIPEN 2-PAK) 0.3 mg/0.3 mL IJ SOAJ injection Inject 0.3 mg into the muscle as needed for anaphylaxis. 1 each 1   lactobacillus acidophilus (BACID) TABS tablet Take 2 tablets by mouth 3 (three) times daily.     levothyroxine (SYNTHROID) 100 MCG tablet TAKE 1 TABLET(100 MCG) BY MOUTH DAILY BEFORE BREAKFAST 90 tablet 1   magnesium 30 MG tablet Take 30 mg by mouth daily.     Multiple Vitamin (MULTIVITAMIN WITH MINERALS) TABS Take 1 tablet by mouth every morning.      triamcinolone cream (KENALOG) 0.1 % Apply 1 Application topically 2 (two) times daily. 45 g 1   No current facility-administered medications on file prior to visit.     The following portions of the patient's history were reviewed and updated as appropriate: allergies, current medications, past family history, past medical history, past social history, past surgical history and problem list.  ROS Otherwise  as in subjective above  Objective: BP 122/70   Pulse 64   Wt 271 lb 9.6 oz (123.2 kg)   BMI 41.30 kg/m   BP Readings from Last 3 Encounters:  12/16/22 122/70  08/11/22 120/70  07/22/22 139/71   Wt Readings from Last 3 Encounters:  12/16/22 271 lb 9.6 oz (123.2 kg)  08/11/22 274 lb 12.8 oz (124.6 kg)  07/21/22 270 lb (122.5 kg)    General appearance: alert, no distress, well developed, well nourished HEENT: normocephalic, sclerae anicteric, conjunctiva pink and moist, TMs pearly, nares patent, no discharge or erythema, pharynx normal Oral cavity: MMM, no lesions Neck: supple, no lymphadenopathy, no thyromegaly, no masses, no bruits Heart: RRR, normal S1, S2, no murmurs Lungs: CTA bilaterally, no wheezes, rhonchi, or rales Pulses: 2+ radial pulses, 2+ pedal pulses,  normal cap refill Ext: no edema Neuro: CN II through XII intact, nonfocal exam Psych: Pleasant, answers questions appropriately  EKG reviewed  Sinus bradycardia but no new EKG changes   Assessment: Encounter Diagnoses  Name Primary?   Syncope, unspecified syncope type Yes   Dizziness      Plan: We discussed symptoms, possible differential.  She has had 1 episode in August and 1 episode today.  Labs as below.  EKG reviewed.    If labs are normal we will refer for event monitor testing such as Zio patch through cardiology office or through the hospital  We discussed possibly doing a sleep study  Kyia was seen today for dizziness.  Diagnoses and all orders for this visit:  Syncope, unspecified syncope type -     Basic metabolic panel -     TSH + free T4 -     CBC -     EKG 12-Lead  Dizziness -     Basic metabolic panel -     TSH + free T4 -     CBC -     EKG 12-Lead    Follow up: Pending labs

## 2022-12-16 NOTE — Progress Notes (Unsigned)
Enrolled patient for a 7 day Zio XT monitor to be mailed to patients home   DOD to read 

## 2022-12-16 NOTE — Addendum Note (Signed)
Addended by: Herminio Commons A on: 12/16/2022 04:53 PM   Modules accepted: Orders

## 2022-12-17 LAB — BASIC METABOLIC PANEL
BUN/Creatinine Ratio: 20 (ref 9–23)
BUN: 17 mg/dL (ref 6–24)
CO2: 23 mmol/L (ref 20–29)
Calcium: 9.3 mg/dL (ref 8.7–10.2)
Chloride: 105 mmol/L (ref 96–106)
Creatinine, Ser: 0.85 mg/dL (ref 0.57–1.00)
Glucose: 84 mg/dL (ref 70–99)
Potassium: 4.2 mmol/L (ref 3.5–5.2)
Sodium: 143 mmol/L (ref 134–144)
eGFR: 80 mL/min/{1.73_m2} (ref >59)

## 2022-12-17 LAB — CBC
Hematocrit: 35.9 % (ref 34.0–46.6)
Hemoglobin: 11.3 g/dL (ref 11.1–15.9)
MCH: 28.1 pg (ref 26.6–33.0)
MCHC: 31.5 g/dL (ref 31.5–35.7)
MCV: 89 fL (ref 79–97)
Platelets: 204 10*3/uL (ref 150–450)
RBC: 4.02 x10E6/uL (ref 3.77–5.28)
RDW: 13.3 % (ref 11.7–15.4)
WBC: 6.2 10*3/uL (ref 3.4–10.8)

## 2022-12-17 LAB — TSH+FREE T4
Free T4: 1.51 ng/dL (ref 0.82–1.77)
TSH: 1.35 u[IU]/mL (ref 0.450–4.500)

## 2022-12-17 NOTE — Progress Notes (Signed)
Where are we doing the event monitor now?  Does she already have it?  Results sent through MyChart

## 2022-12-19 DIAGNOSIS — R42 Dizziness and giddiness: Secondary | ICD-10-CM

## 2022-12-19 DIAGNOSIS — R55 Syncope and collapse: Secondary | ICD-10-CM

## 2023-01-05 NOTE — Progress Notes (Signed)
There were some fast runs of heart rate at times.  Overall nothing too scary.    If still getting symptom, I recommend trial of beta blocker medicaiton such as Metoprolol as needed or possibly daily if a lot of palpitations or symptoms still.  How are your current symptoms?

## 2023-04-15 ENCOUNTER — Other Ambulatory Visit: Payer: Self-pay | Admitting: Medical

## 2023-04-15 NOTE — Telephone Encounter (Signed)
Appointment in May

## 2023-07-08 ENCOUNTER — Telehealth: Payer: Self-pay | Admitting: Medical

## 2023-07-08 NOTE — Telephone Encounter (Signed)
 Fax from [facility]  L- thyroxine  100 mcg

## 2023-07-08 NOTE — Telephone Encounter (Signed)
 Waiting on reply

## 2023-07-20 MED ORDER — LEVOTHYROXINE SODIUM 100 MCG PO TABS: ORAL_TABLET | ORAL | 1 refills | Status: DC

## 2023-07-20 NOTE — Addendum Note (Signed)
 Addended by: Charliene Conte A on: 07/20/2023 10:21 AM   Modules accepted: Orders

## 2023-08-12 ENCOUNTER — Encounter: Payer: Self-pay | Admitting: Medical

## 2023-08-12 ENCOUNTER — Ambulatory Visit (INDEPENDENT_AMBULATORY_CARE_PROVIDER_SITE_OTHER): Payer: Commercial Managed Care - HMO | Admitting: Medical

## 2023-08-12 VITALS — BP 130/74 | HR 54 | Ht 68.0 in | Wt 270.2 lb

## 2023-08-12 DIAGNOSIS — Z23 Encounter for immunization: Secondary | ICD-10-CM | POA: Diagnosis not present

## 2023-08-12 DIAGNOSIS — Z780 Asymptomatic menopausal state: Secondary | ICD-10-CM

## 2023-08-12 DIAGNOSIS — Z0000 Encounter for general adult medical examination without abnormal findings: Secondary | ICD-10-CM | POA: Diagnosis not present

## 2023-08-12 DIAGNOSIS — Z7185 Encounter for immunization safety counseling: Secondary | ICD-10-CM

## 2023-08-12 DIAGNOSIS — M778 Other enthesopathies, not elsewhere classified: Secondary | ICD-10-CM | POA: Insufficient documentation

## 2023-08-12 DIAGNOSIS — M25561 Pain in right knee: Secondary | ICD-10-CM | POA: Insufficient documentation

## 2023-08-12 DIAGNOSIS — E039 Hypothyroidism, unspecified: Secondary | ICD-10-CM | POA: Diagnosis not present

## 2023-08-12 DIAGNOSIS — I2510 Atherosclerotic heart disease of native coronary artery without angina pectoris: Secondary | ICD-10-CM | POA: Diagnosis not present

## 2023-08-12 DIAGNOSIS — Z131 Encounter for screening for diabetes mellitus: Secondary | ICD-10-CM | POA: Diagnosis not present

## 2023-08-12 NOTE — Progress Notes (Signed)
 Complete physical exam  Patient: Denise Schroeder   DOB: November 16, 1965   58 y.o. Female  MRN: [id number]  Subjective:    Chief Complaint  Patient presents with   Annual Exam    Cpe. Thumb and knee is causing pain.    Denise Schroeder is a 58 y.o. female who presents today for a complete physical exam.    Patient Care Team: Bridey Brookover, Christiane Cowing, PA-C as PCP - General (Family Medicine) Sheryle Donning, MD as PCP - Cardiology (Cardiology) Dr. Ivery Marking, gynecology    Here for physical  Lately having some pain in her left thumb.  Thinks is tendinitis.  She is in school and working full-time and doing a lot of typing.  No swelling.  No injury or trauma.  She also has been having some right knee pain laterally.  She does go up and down stairs throughout the day.  No swelling.  No injury or trauma but ongoing pain in 1 specific area on the outside of her knee  Compliant with her thyroid  medicine without complaint   Most recent fall risk assessment:    08/11/2022    9:46 AM  Fall Risk   Falls in the past year? 1  Number falls in past yr: 0  Comment 07/21/22  Injury with Fall? 1  Comment sprained L ankle  Risk for fall due to : History of fall(s)  Follow up Falls evaluation completed     Most recent depression screenings:    08/11/2022    9:46 AM 08/06/2021   11:23 AM  PHQ 2/9 Scores  PHQ - 2 Score 0 0       Outpatient Medications Prior to Visit  Medication Sig Note   albuterol  (VENTOLIN  HFA) 108 (90 Base) MCG/ACT inhaler Inhale 2 puffs into the lungs every 6 (six) hours as needed for wheezing or shortness of breath. 08/12/2023: Needs refill   betamethasone dipropionate 0.05 % cream Apply topically daily.    BLACK CURRANT SEED OIL PO Take by mouth.    CALCIUM  PO Take 2 tablets by mouth at bedtime.     cyanocobalamin (VITAMIN B12) 1000 MCG tablet Take 1,000 mcg by mouth daily.    Emollient (COLLAGEN EX) Apply topically.    levothyroxine  (SYNTHROID ) 100 MCG  tablet TAKE 1 TABLET(100 MCG) BY MOUTH DAILY BEFORE BREAKFAST    magnesium  30 MG tablet Take 30 mg by mouth daily.    Multiple Vitamin (MULTIVITAMIN WITH MINERALS) TABS Take 1 tablet by mouth every morning.     triamcinolone  cream (KENALOG ) 0.1 % Apply 1 Application topically 2 (two) times daily.    EPINEPHrine  (EPIPEN  2-PAK) 0.3 mg/0.3 mL IJ SOAJ injection Inject 0.3 mg into the muscle as needed for anaphylaxis. (Patient not taking: Reported on 08/12/2023) 08/12/2023: Expires in November 2025   lactobacillus acidophilus (BACID) TABS tablet Take 2 tablets by mouth 3 (three) times daily. (Patient not taking: Reported on 08/12/2023)    No facility-administered medications prior to visit.    Review of Systems  Constitutional:  Negative for chills, fever, malaise/fatigue and weight loss.  HENT:  Negative for congestion, ear pain, hearing loss, sore throat and tinnitus.   Eyes:  Negative for blurred vision, pain and redness.  Respiratory:  Negative for cough, hemoptysis and shortness of breath.   Cardiovascular:  Negative for chest pain, palpitations, orthopnea, claudication and leg swelling.  Gastrointestinal:  Negative for abdominal pain, blood in stool, constipation, diarrhea, nausea and vomiting.  Genitourinary:  Negative for dysuria,  flank pain, frequency, hematuria and urgency.  Musculoskeletal:  Positive for joint pain. Negative for falls and myalgias.  Skin:  Negative for itching and rash.  Neurological:  Negative for dizziness, tingling, speech change, weakness and headaches.  Endo/Heme/Allergies:  Negative for polydipsia. Does not bruise/bleed easily.  Psychiatric/Behavioral:  Negative for depression and memory loss. The patient is not nervous/anxious and does not have insomnia.        Objective:     BP 130/74   Pulse (!) 54   Ht 5\' 8"  (1.727 m)   Wt 270 lb 3.2 oz (122.6 kg)   SpO2 98%   BMI 41.08 kg/m      General appearence: alert, no distress, WD/WN,  HEENT: normocephalic,  sclerae anicteric, PERRLA, EOMi, nares patent, no discharge or erythema, pharynx normal Oral cavity: MMM, no lesions Neck: supple, no lymphadenopathy, no thyromegaly, no masses, no bruits or JVD Heart: RRR, normal S1, S2, no murmurs Lungs: CTA bilaterally, no wheezes, rhonchi, or rales Abdomen: +bs, soft, non tender, non distended, no masses, no hepatomegaly, no splenomegaly Back: non tender Musculoskeletal: Tender over the left thumb base and DIP, pain with resisted flexion and extension of the left thumb, no swelling or other deformity.  Tender over the right knee distal vastus lateralis insertion, otherwise nontender no laxity, no swelling.  Rest of legs unremarkable., nontender, no swelling, no obvious deformity Extremities: no edema, no cyanosis, no clubbing Pulses: 2+ symmetric, upper and lower extremities, normal cap refill Neurological: alert, oriented x 3, CN2-12 intact, strength normal upper extremities and lower extremities, sensation normal throughout, DTRs 2+ throughout, no cerebellar signs, gait normal Psychiatric: normal affect, behavior normal, pleasant  Breast/GYN-deferred to gynecology     Assessment & Plan:    Encounter Diagnoses  Name Primary?   Encounter for health maintenance examination in adult Yes   Hypothyroidism, unspecified type    Coronary artery calcification seen on CAT scan    Screening for diabetes mellitus    Right knee pain, unspecified chronicity    Thumb tendonitis    Need for shingles vaccine    Vaccine counseling    Postmenopausal estrogen deficiency     This visit was a preventative care visit, also known as wellness visit or routine physical.   Topics typically include healthy lifestyle, diet, exercise, preventative care, vaccinations, sick and well care, proper use of emergency dept and after hours care, as well as other concerns.     Recommendations: Continue to return yearly for your annual wellness and preventative care visits.  This  gives us  a chance to discuss healthy lifestyle, exercise, vaccinations, review your chart record, and perform screenings where appropriate.  I recommend you see your eye doctor yearly for routine vision care.  I recommend you see your dentist yearly for routine dental care including hygiene visits twice yearly.   Vaccination recommendations were reviewed Immunization History  Administered Date(s) Administered   Influenza,inj,Quad PF,6+ Mos 11/07/2014, 11/10/2016, 11/10/2017, 11/29/2018, 12/19/2019, 01/06/2021, 12/16/2021   Influenza-Unspecified 11/28/2015   PFIZER(Purple Top)SARS-COV-2 Vaccination 06/12/2019, 07/05/2019, 03/06/2020   PPD Test 09/25/2003, 03/13/2011   Td 01/08/1999   Tdap 11/07/2014   Zoster Recombinant(Shingrix) 08/12/2023    You are up to date on the following vaccines: Prevnar 20 Shingrix   Vaccinations updated as below: Counseled on the Shingrix vaccine.  Vaccine information sheet given. Shingrix #1 vaccine given after consent obtained.   Return in 2 months for Shingrix #2.   Screening for cancer: Colon cancer screening: Due repeat colonoscopy 2026  Sees gyn for pap, mammogram  Skin cancer screening: Check your skin regularly for new changes, growing lesions, or other lesions of concern Come in for evaluation if you have skin lesions of concern.  Lung cancer screening: If you have a greater than 20 pack year history of tobacco use, then you may qualify for lung cancer screening with a chest CT scan.   Please call your insurance company to inquire about coverage for this test.  We currently don't have screenings for other cancers besides breast, cervical, colon, and lung cancers.  If you have a strong family history of cancer or have other cancer screening concerns, please let me know.    Bone health: Get at least 150 minutes of aerobic exercise weekly Get weight bearing exercise at least once weekly Bone density test:  A bone density test is an  imaging test that uses a type of X-ray to measure the amount of calcium  and other minerals in your bones. The test may be used to diagnose or screen you for a condition that causes weak or thin bones (osteoporosis), predict your risk for a broken bone (fracture), or determine how well your osteoporosis treatment is working. The bone density test is recommended for females 65 and older, or females or males <65 if certain risk factors such as thyroid  disease, long term use of steroids such as for asthma or rheumatological issues, vitamin D  deficiency, estrogen deficiency, family history of osteoporosis, self or family history of fragility fracture in first degree relative.  Please call to schedule your bone density test.   [facility]  [phone] [address] [place], [place] [address]    Heart health: Get at least 150 minutes of aerobic exercise weekly Limit alcohol It is important to maintain a healthy blood pressure and healthy cholesterol numbers  Heart disease screening: Screening for heart disease includes screening for blood pressure, fasting lipids, glucose/diabetes screening, BMI height to weight ratio, reviewed of smoking status, physical activity, and diet.    Goals include blood pressure 120/80 or less, maintaining a healthy lipid/cholesterol profile, preventing diabetes or keeping diabetes numbers under good control, not smoking or using tobacco products, exercising most days per week or at least 150 minutes per week of exercise, and eating healthy variety of fruits and vegetables, healthy oils, and avoiding unhealthy food choices like fried food, fast food, high sugar and high cholesterol foods.    CT coronary 2022 with mild CAD, advised statin.   Medical care options: I recommend you continue to seek care here first for routine care.  We try really hard to have available appointments Monday through Friday daytime hours for sick  visits, acute visits, and physicals.  Urgent care should be used for after hours and weekends for significant issues that cannot wait till the next day.  The emergency department should be used for significant potentially life-threatening emergencies.  The emergency department is expensive, can often have long wait times for less significant concerns, so try to utilize primary care, urgent care, or telemedicine when possible to avoid unnecessary trips to the emergency department.  Virtual visits and telemedicine have been introduced since the pandemic started in 2020, and can be convenient ways to receive medical care.  We offer virtual appointments as well to assist you in a variety of options to seek medical care.    Separate significant issues discussed: Thumb tendinitis-advised thumb spica splint over-the-counter, relative rest over the next 1 to 2 weeks,  can use cold therapy twice daily, Tylenol , Aleve once daily short-term for the next week  Knee pain-advised stretching as demonstrated, relative rest, over-the-counter knee sleeve or brace if desired for stability and compression, Tylenol  alternating with Aleve over the next 3 to 5 days.  Consider sports medicine referral if this continues to be a problem.  She has a prior x-ray several years ago that showed arthritis.  Consider repeat x-ray  Hypothyroidism-continue current medication, labs today  CAD on prior CT coronary test 2022.  Advised statin.  She will consider.  Labs today   Mikhala was seen today for annual exam.  Diagnoses and all orders for this visit:  Encounter for health maintenance examination in adult -     CBC -     Lipid panel -     Comprehensive metabolic panel with GFR -     TSH + free T4 -     Hemoglobin A1c -     DG Bone Density; Future  Hypothyroidism, unspecified type -     TSH + free T4  Coronary artery calcification seen on CAT scan -     Lipid panel  Screening for diabetes mellitus -     Hemoglobin  A1c  Right knee pain, unspecified chronicity  Thumb tendonitis  Need for shingles vaccine -     Zoster Recombinant (Shingrix  )  Vaccine counseling  Postmenopausal estrogen deficiency -     DG Bone Density; Future     Follow-up pending labs, yearly for physical

## 2023-08-12 NOTE — Patient Instructions (Signed)
 This visit was a preventative care visit, also known as wellness visit or routine physical.   Topics typically include healthy lifestyle, diet, exercise, preventative care, vaccinations, sick and well care, proper use of emergency dept and after hours care, as well as other concerns.     Recommendations: Continue to return yearly for your annual wellness and preventative care visits.  This gives us  a chance to discuss healthy lifestyle, exercise, vaccinations, review your chart record, and perform screenings where appropriate.  I recommend you see your eye doctor yearly for routine vision care.  I recommend you see your dentist yearly for routine dental care including hygiene visits twice yearly.   Vaccination recommendations were reviewed Immunization History  Administered Date(s) Administered   Influenza,inj,Quad PF,6+ Mos 11/07/2014, 11/10/2016, 11/10/2017, 11/29/2018, 12/19/2019, 01/06/2021, 12/16/2021   Influenza-Unspecified 11/28/2015   PFIZER(Purple Top)SARS-COV-2 Vaccination 06/12/2019, 07/05/2019, 03/06/2020   PPD Test 09/25/2003, 03/13/2011   Td 01/08/1999   Tdap 11/07/2014   Zoster Recombinant(Shingrix ) 08/12/2023    You are up to date on the following vaccines: Prevnar 20 Shingrix    Vaccinations updated as below: Counseled on the Shingrix  vaccine.  Vaccine information sheet given. Shingrix  #1 vaccine given after consent obtained.   Return in 2 months for Shingrix  #2.   Screening for cancer: Colon cancer screening: Due repeat colonoscopy 2026  Sees gyn for pap, mammogram  Skin cancer screening: Check your skin regularly for new changes, growing lesions, or other lesions of concern Come in for evaluation if you have skin lesions of concern.  Lung cancer screening: If you have a greater than 20 pack year history of tobacco use, then you may qualify for lung cancer screening with a chest CT scan.   Please call your insurance company to inquire about coverage for  this test.  We currently don't have screenings for other cancers besides breast, cervical, colon, and lung cancers.  If you have a strong family history of cancer or have other cancer screening concerns, please let me know.    Bone health: Get at least 150 minutes of aerobic exercise weekly Get weight bearing exercise at least once weekly Bone density test:  A bone density test is an imaging test that uses a type of X-ray to measure the amount of calcium  and other minerals in your bones. The test may be used to diagnose or screen you for a condition that causes weak or thin bones (osteoporosis), predict your risk for a broken bone (fracture), or determine how well your osteoporosis treatment is working. The bone density test is recommended for females 65 and older, or females or males <65 if certain risk factors such as thyroid  disease, long term use of steroids such as for asthma or rheumatological issues, vitamin D  deficiency, estrogen deficiency, family history of osteoporosis, self or family history of fragility fracture in first degree relative.  Please call to schedule your bone density test.   [facility]  [phone] [address] [place], [place] [address]    Heart health: Get at least 150 minutes of aerobic exercise weekly Limit alcohol It is important to maintain a healthy blood pressure and healthy cholesterol numbers  Heart disease screening: Screening for heart disease includes screening for blood pressure, fasting lipids, glucose/diabetes screening, BMI height to weight ratio, reviewed of smoking status, physical activity, and diet.    Goals include blood pressure 120/80 or less, maintaining a healthy lipid/cholesterol profile, preventing diabetes or keeping diabetes numbers under good control, not  smoking or using tobacco products, exercising most days per week or at least 150 minutes per week of exercise, and eating healthy  variety of fruits and vegetables, healthy oils, and avoiding unhealthy food choices like fried food, fast food, high sugar and high cholesterol foods.    CT coronary 2022 with mild CAD, advised statin.   Medical care options: I recommend you continue to seek care here first for routine care.  We try really hard to have available appointments Monday through Friday daytime hours for sick visits, acute visits, and physicals.  Urgent care should be used for after hours and weekends for significant issues that cannot wait till the next day.  The emergency department should be used for significant potentially life-threatening emergencies.  The emergency department is expensive, can often have long wait times for less significant concerns, so try to utilize primary care, urgent care, or telemedicine when possible to avoid unnecessary trips to the emergency department.  Virtual visits and telemedicine have been introduced since the pandemic started in 2020, and can be convenient ways to receive medical care.  We offer virtual appointments as well to assist you in a variety of options to seek medical care.    Separate significant issues discussed: Thumb tendinitis-advised thumb spica splint over-the-counter, relative rest over the next 1 to 2 weeks, can use cold therapy twice daily, Tylenol , Aleve once daily short-term for the next week  Knee pain-advised stretching as demonstrated, relative rest, over-the-counter knee sleeve or brace if desired for stability and compression, Tylenol  alternating with Aleve over the next 3 to 5 days.  Consider sports medicine referral if this continues to be a problem.  She has a prior x-ray several years ago that showed arthritis.  Consider repeat x-ray  Hypothyroidism-continue current medication, labs today  CAD on prior CT coronary test 2022.  Advised statin.  She will consider.  Labs today

## 2023-08-13 ENCOUNTER — Ambulatory Visit: Payer: Self-pay | Admitting: Medical

## 2023-08-13 ENCOUNTER — Other Ambulatory Visit: Payer: Self-pay | Admitting: Medical

## 2023-08-13 DIAGNOSIS — J45909 Unspecified asthma, uncomplicated: Secondary | ICD-10-CM

## 2023-08-13 LAB — LIPID PANEL
Chol/HDL Ratio: 3.5 ratio (ref 0.0–4.4)
Cholesterol, Total: 220 mg/dL — ABNORMAL HIGH (ref 100–199)
HDL: 63 mg/dL (ref >39)
LDL Chol Calc (NIH): 147 mg/dL — ABNORMAL HIGH (ref 0–99)
Triglycerides: 55 mg/dL (ref 0–149)
VLDL Cholesterol Cal: 10 mg/dL (ref 5–40)

## 2023-08-13 LAB — COMPREHENSIVE METABOLIC PANEL WITH GFR
ALT: 13 IU/L (ref 0–32)
AST: 16 IU/L (ref 0–40)
Albumin: 4.5 g/dL (ref 3.8–4.9)
Alkaline Phosphatase: 92 IU/L (ref 44–121)
BUN/Creatinine Ratio: 23 (ref 9–23)
BUN: 20 mg/dL (ref 6–24)
Bilirubin Total: 0.4 mg/dL (ref 0.0–1.2)
CO2: 22 mmol/L (ref 20–29)
Calcium: 9.7 mg/dL (ref 8.7–10.2)
Chloride: 103 mmol/L (ref 96–106)
Creatinine, Ser: 0.88 mg/dL (ref 0.57–1.00)
Globulin, Total: 2.6 g/dL (ref 1.5–4.5)
Glucose: 74 mg/dL (ref 70–99)
Potassium: 4.6 mmol/L (ref 3.5–5.2)
Sodium: 139 mmol/L (ref 134–144)
Total Protein: 7.1 g/dL (ref 6.0–8.5)
eGFR: 76 mL/min/{1.73_m2} (ref >59)

## 2023-08-13 LAB — CBC
Hematocrit: 38.6 % (ref 34.0–46.6)
Hemoglobin: 12.4 g/dL (ref 11.1–15.9)
MCH: 28.4 pg (ref 26.6–33.0)
MCHC: 32.1 g/dL (ref 31.5–35.7)
MCV: 89 fL (ref 79–97)
Platelets: 248 10*3/uL (ref 150–450)
RBC: 4.36 x10E6/uL (ref 3.77–5.28)
RDW: 12.9 % (ref 11.7–15.4)
WBC: 5.9 10*3/uL (ref 3.4–10.8)

## 2023-08-13 LAB — TSH+FREE T4
Free T4: 1.31 ng/dL (ref 0.82–1.77)
TSH: 2.6 u[IU]/mL (ref 0.450–4.500)

## 2023-08-13 LAB — HEMOGLOBIN A1C
Est. average glucose Bld gHb Est-mCnc: 105 mg/dL
Hgb A1c MFr Bld: 5.3 % (ref 4.8–5.6)

## 2023-08-13 MED ORDER — ROSUVASTATIN CALCIUM 10 MG PO TABS: 10.0000 mg | ORAL_TABLET | Freq: Every day | ORAL | 3 refills | Status: AC

## 2023-08-13 MED ORDER — ALBUTEROL SULFATE HFA 108 (90 BASE) MCG/ACT IN AERS: 2.0000 | INHALATION_SPRAY | Freq: Four times a day (QID) | RESPIRATORY_TRACT | 1 refills | Status: AC | PRN

## 2023-08-13 NOTE — Progress Notes (Signed)
 Results sent through MyChart

## 2023-08-29 IMAGING — US US THYROID
1 series · 13 of 25 positions shown · non-contrast
Comparison: None Available.

CLINICAL DATA: 56-year-old female with a history of enlarged
thyroid

EXAM:
THYROID ULTRASOUND
TECHNIQUE: Ultrasound examination of the thyroid gland and adjacent soft
tissues was performed.

[Series 1: us thyroid · 0.06mm/px · 68 acquisitions, 13 frames shown]
[im 1/68]
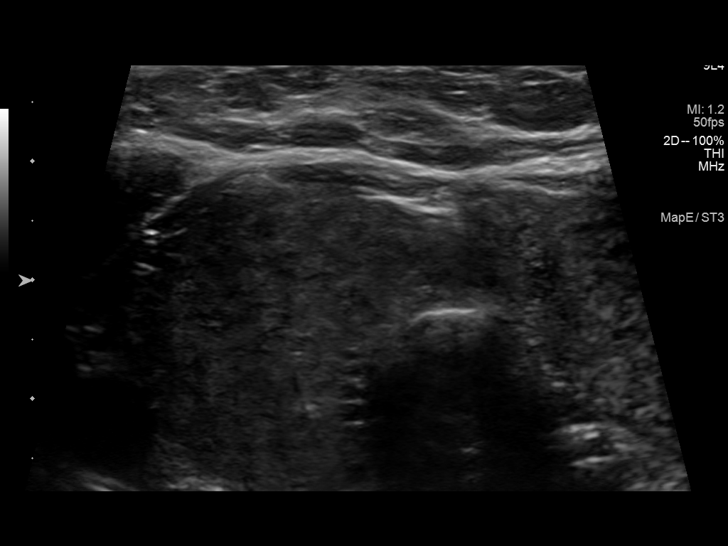
[im 6/68]
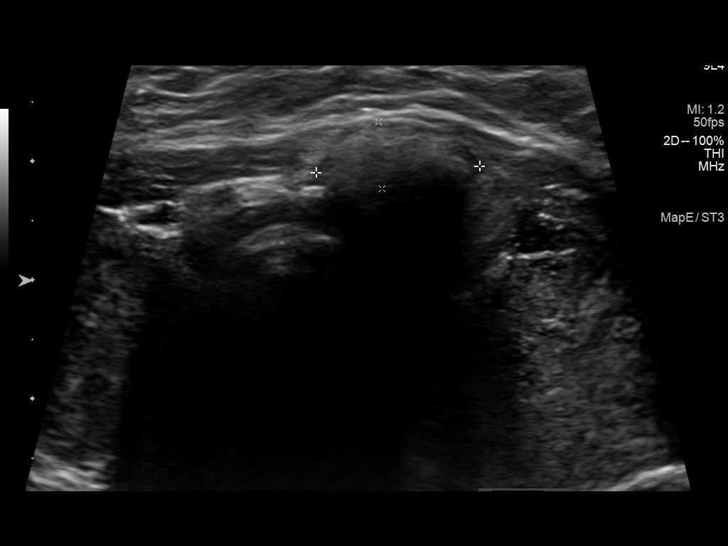
[im 12/68]
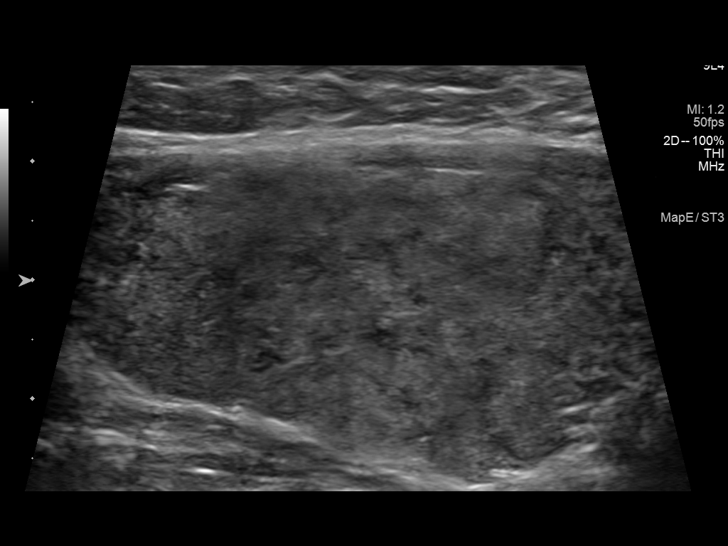
[im 17/68]
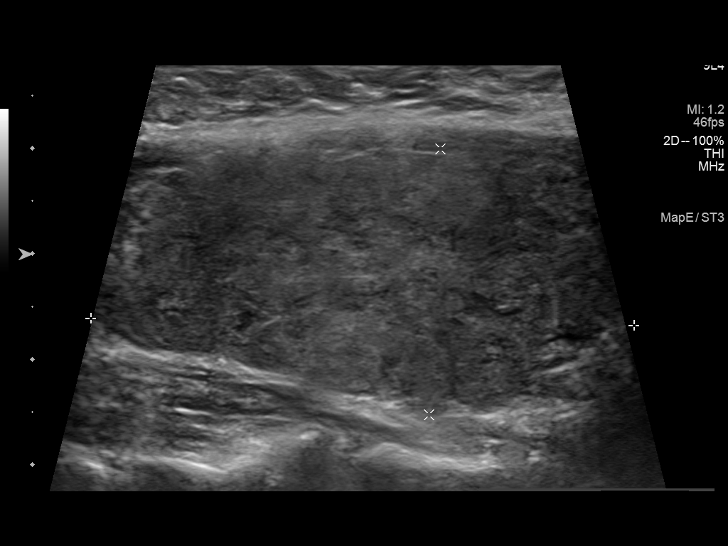
[im 23/68]
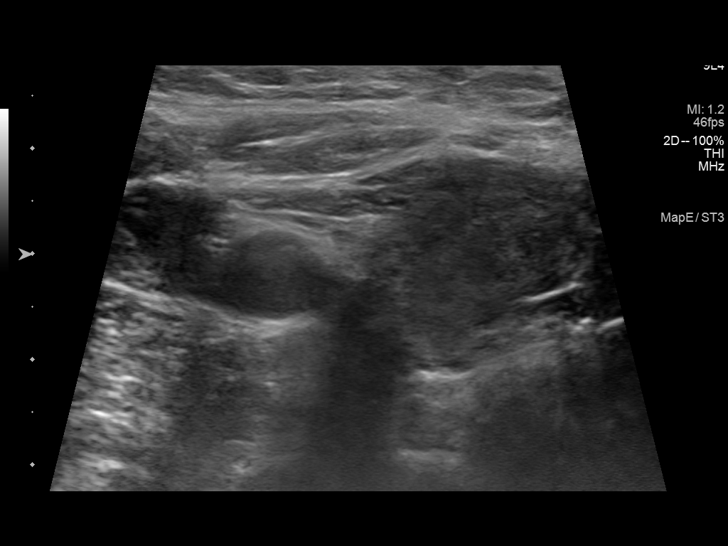
[im 28/68]
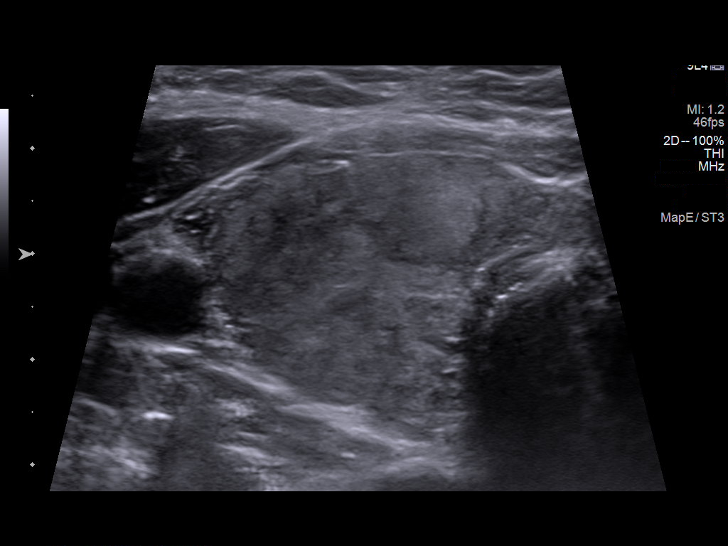
[im 34/68]
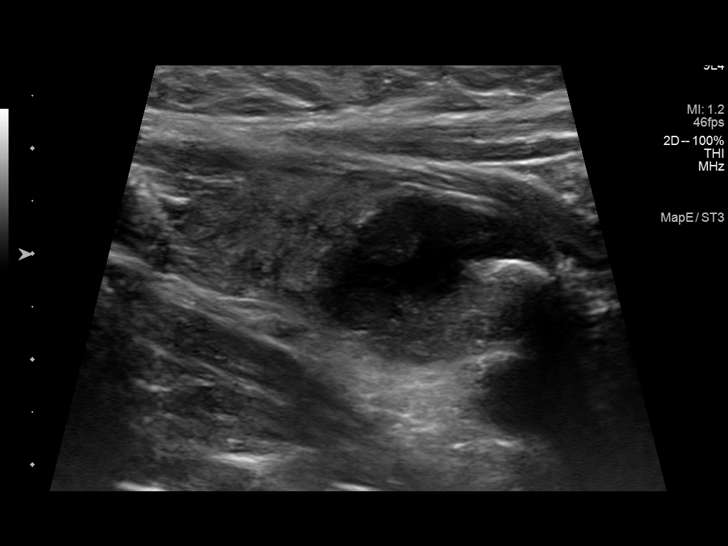
[im 40/68]
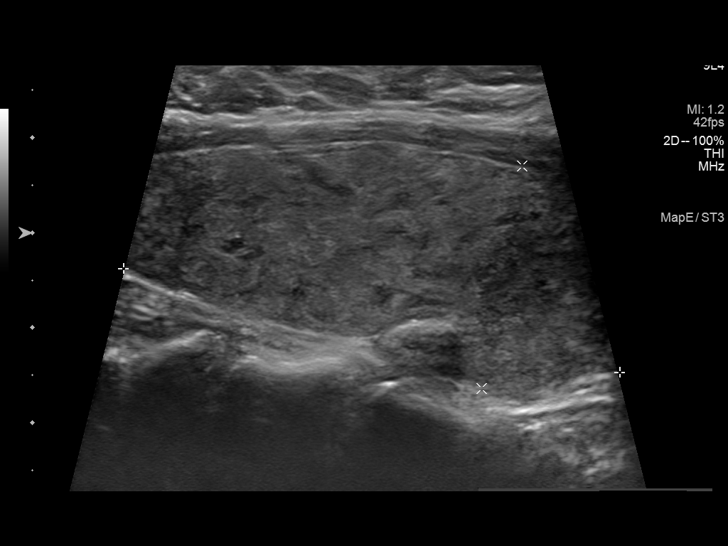
[im 45/68]
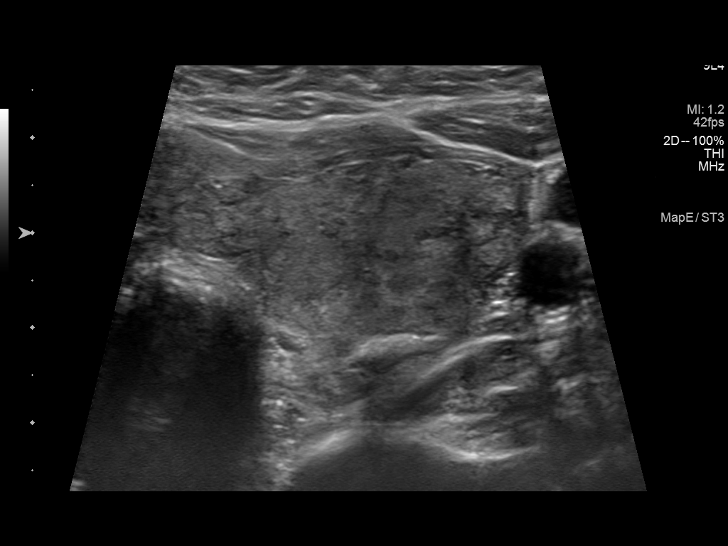
[im 51/68]
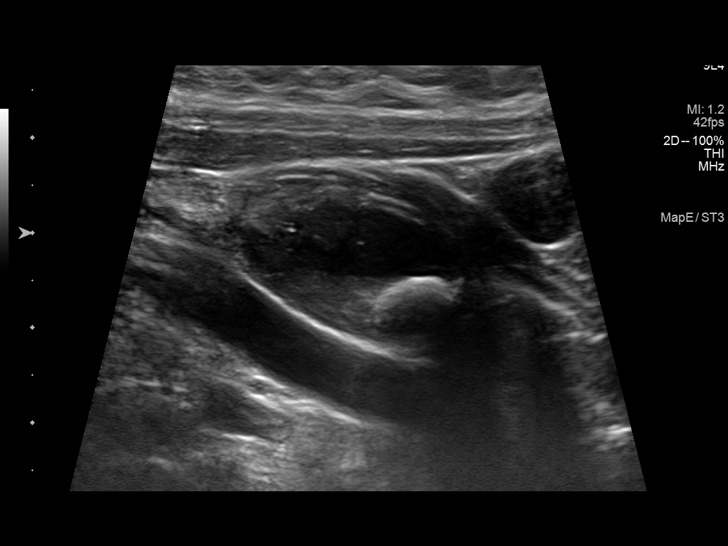
[im 56/68]
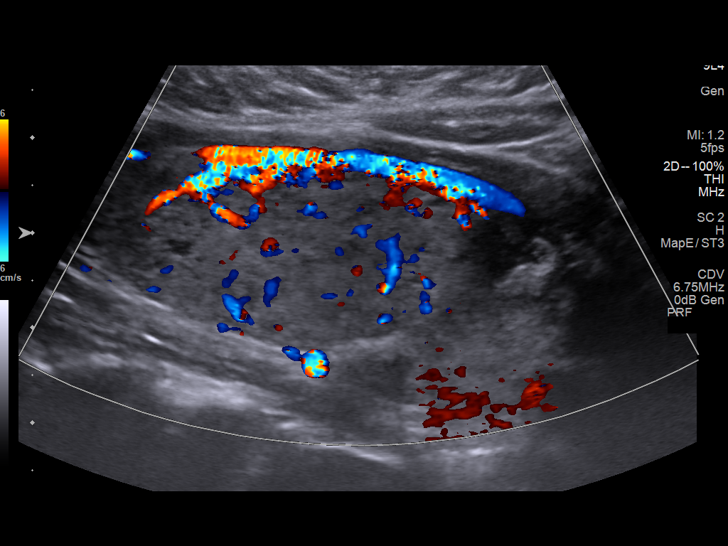
[im 62/68]
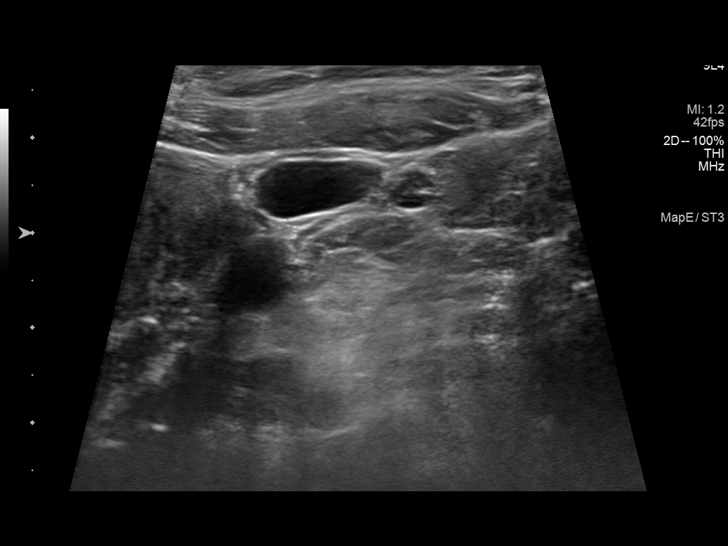
[im 68/68]
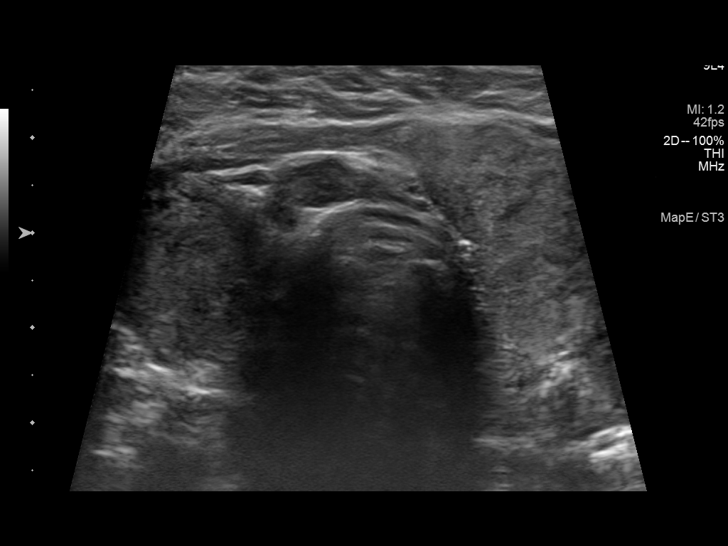

[13 of 25 positions shown; findings below may reference images not displayed]

FINDINGS: Parenchymal Echotexture: Moderately heterogenous

Isthmus: 1.1 cm

Right lobe: 6.0 cm x 2.2 cm x 2.6 cm

Left lobe: 5.9 cm x 2.6 cm x 2.8 cm

_________________________________________________________

Estimated total number of nodules >/= 1 cm: 2

Number of spongiform nodules >/=  2 cm not described below (TR1): 0

Number of mixed cystic and solid nodules >/= 1.5 cm not described
below (TR2): 0

_________________________________________________________

Nodule # 1:

Location: Isthmus; left

Maximum size: 1.4 cm; Other 2 dimensions: 1.2 cm x 0.6 cm

Composition: cannot determine (2)

Echogenicity: hypoechoic (2)

Shape: not taller-than-wide (0)

Margins: lobulated/irregular (2)

Echogenic foci: none (0)

ACR TI-RADS total points: 6.

ACR TI-RADS risk category: TR4 (4-6 points).

ACR TI-RADS recommendations:

Nodule meets criteria for surveillance

_________________________________________________________

Nodule # 2:

Location: Left; Inferior

Maximum size: 2.7 cm; Other 2 dimensions: 1.8 cm x 1.6 cm

Composition: mixed cystic and solid (1)

Echogenicity: hypoechoic (2)

Shape: not taller-than-wide (0)

Margins: ill-defined (0)

Echogenic foci: macrocalcifications (1)

ACR TI-RADS total points: 4.

ACR TI-RADS risk category: TR4 (4-6 points).

ACR TI-RADS recommendations:

Nodule meets criteria for biopsy

_________________________________________________________

No adenopathy
IMPRESSION: Left inferior thyroid nodule meets criteria for biopsy, as
designated by the newly established ACR TI-RADS criteria, and
referral for biopsy is recommended.

Isthmic thyroid nodule meets criteria for surveillance, as
designated by the newly established ACR TI-RADS criteria.
Surveillance ultrasound study recommended to be performed annually
up to 5 years.

Recommendations follow those established by the new ACR TI-RADS
criteria ([HOSPITAL] 9809;[DATE]).

## 2023-09-08 LAB — HM MAMMOGRAPHY

## 2023-09-10 ENCOUNTER — Encounter: Payer: Self-pay | Admitting: Medical

## 2023-09-13 ENCOUNTER — Ambulatory Visit: Payer: Self-pay | Admitting: Medical

## 2023-10-13 ENCOUNTER — Telehealth: Payer: Self-pay | Admitting: Internal Medicine

## 2023-10-13 NOTE — Telephone Encounter (Signed)
 Tried to call patient but got disconnected. Will try again tomororw

## 2023-10-13 NOTE — Telephone Encounter (Signed)
 Copied from CRM [id number]. Topic: General - Other >> Oct 13, 2023  9:52 AM Myrick T wrote: Reason for CRM: patient called said since she has been taking rosuvastatin  (CRESTOR ) 10 MG tablet she has had low energy. Patient wanted her provider to know

## 2023-10-14 ENCOUNTER — Other Ambulatory Visit

## 2023-10-14 NOTE — Telephone Encounter (Signed)
 Pt was notified.

## 2023-10-18 ENCOUNTER — Other Ambulatory Visit: Payer: Self-pay | Admitting: Medical

## 2023-10-21 ENCOUNTER — Other Ambulatory Visit

## 2024-04-14 ENCOUNTER — Ambulatory Visit: Payer: Self-pay | Admitting: Podiatry

## 2024-08-16 ENCOUNTER — Encounter: Admitting: Medical
